# Patient Record
Sex: Female | Born: 1970 | Race: White | Hispanic: No | Marital: Single | Smoking: Light tobacco smoker
Health system: Southern US, Community
[De-identification: ages and names within clinical notes are randomized; demographics above are authoritative.]

## PROBLEM LIST (undated history)

## (undated) DIAGNOSIS — S0181XA Laceration without foreign body of other part of head, initial encounter: Secondary | ICD-10-CM

## (undated) DIAGNOSIS — Z8541 Personal history of malignant neoplasm of cervix uteri: Secondary | ICD-10-CM

## (undated) DIAGNOSIS — J302 Other seasonal allergic rhinitis: Secondary | ICD-10-CM

## (undated) DIAGNOSIS — C801 Malignant (primary) neoplasm, unspecified: Secondary | ICD-10-CM

## (undated) DIAGNOSIS — F419 Anxiety disorder, unspecified: Secondary | ICD-10-CM

## (undated) DIAGNOSIS — S52501A Unspecified fracture of the lower end of right radius, initial encounter for closed fracture: Secondary | ICD-10-CM

## (undated) DIAGNOSIS — I10 Essential (primary) hypertension: Secondary | ICD-10-CM

## (undated) DIAGNOSIS — I2510 Atherosclerotic heart disease of native coronary artery without angina pectoris: Secondary | ICD-10-CM

## (undated) DIAGNOSIS — R569 Unspecified convulsions: Secondary | ICD-10-CM

## (undated) DIAGNOSIS — E785 Hyperlipidemia, unspecified: Secondary | ICD-10-CM

## (undated) HISTORY — PX: ABDOMINAL HYSTERECTOMY: SHX81

## (undated) HISTORY — PX: CARDIAC CATHETERIZATION: SHX172

## (undated) HISTORY — PX: CHOLECYSTECTOMY: SHX55

---

## 1998-03-02 ENCOUNTER — Emergency Department (HOSPITAL_COMMUNITY): Admission: EM | Admit: 1998-03-02 | Discharge: 1998-03-02 | Payer: Self-pay | Admitting: Emergency Medicine

## 1998-03-03 ENCOUNTER — Ambulatory Visit (HOSPITAL_COMMUNITY): Admission: RE | Admit: 1998-03-03 | Discharge: 1998-03-03 | Payer: Self-pay | Admitting: Emergency Medicine

## 1998-07-07 ENCOUNTER — Other Ambulatory Visit: Admission: RE | Admit: 1998-07-07 | Discharge: 1998-07-07 | Payer: Self-pay | Admitting: *Deleted

## 1998-07-17 ENCOUNTER — Other Ambulatory Visit: Admission: RE | Admit: 1998-07-17 | Discharge: 1998-07-17 | Payer: Self-pay | Admitting: *Deleted

## 1998-08-11 ENCOUNTER — Inpatient Hospital Stay (HOSPITAL_COMMUNITY): Admission: RE | Admit: 1998-08-11 | Discharge: 1998-08-12 | Payer: Self-pay | Admitting: *Deleted

## 1998-12-19 ENCOUNTER — Emergency Department (HOSPITAL_COMMUNITY): Admission: EM | Admit: 1998-12-19 | Discharge: 1998-12-19 | Payer: Self-pay | Admitting: Emergency Medicine

## 1999-02-20 ENCOUNTER — Encounter: Payer: Self-pay | Admitting: Emergency Medicine

## 1999-02-20 ENCOUNTER — Emergency Department (HOSPITAL_COMMUNITY): Admission: EM | Admit: 1999-02-20 | Discharge: 1999-02-20 | Payer: Self-pay | Admitting: Emergency Medicine

## 1999-04-15 ENCOUNTER — Emergency Department (HOSPITAL_COMMUNITY): Admission: EM | Admit: 1999-04-15 | Discharge: 1999-04-16 | Payer: Self-pay

## 1999-04-16 ENCOUNTER — Encounter: Payer: Self-pay | Admitting: Emergency Medicine

## 2000-02-12 ENCOUNTER — Emergency Department (HOSPITAL_COMMUNITY): Admission: EM | Admit: 2000-02-12 | Discharge: 2000-02-12 | Payer: Self-pay | Admitting: Emergency Medicine

## 2001-04-05 ENCOUNTER — Emergency Department (HOSPITAL_COMMUNITY): Admission: EM | Admit: 2001-04-05 | Discharge: 2001-04-05 | Payer: Self-pay | Admitting: Emergency Medicine

## 2001-04-05 ENCOUNTER — Encounter: Payer: Self-pay | Admitting: Emergency Medicine

## 2003-06-13 ENCOUNTER — Encounter: Admission: RE | Admit: 2003-06-13 | Discharge: 2003-06-30 | Payer: Self-pay | Admitting: Specialist

## 2004-12-20 ENCOUNTER — Emergency Department (HOSPITAL_COMMUNITY): Admission: EM | Admit: 2004-12-20 | Discharge: 2004-12-20 | Payer: Self-pay | Admitting: Emergency Medicine

## 2004-12-20 ENCOUNTER — Ambulatory Visit: Payer: Self-pay | Admitting: Gastroenterology

## 2004-12-26 ENCOUNTER — Ambulatory Visit: Payer: Self-pay | Admitting: Gastroenterology

## 2004-12-26 ENCOUNTER — Ambulatory Visit (HOSPITAL_COMMUNITY): Admission: RE | Admit: 2004-12-26 | Discharge: 2004-12-26 | Payer: Self-pay | Admitting: Gastroenterology

## 2005-01-16 ENCOUNTER — Ambulatory Visit: Payer: Self-pay | Admitting: Gastroenterology

## 2005-01-17 ENCOUNTER — Ambulatory Visit (HOSPITAL_COMMUNITY): Admission: RE | Admit: 2005-01-17 | Discharge: 2005-01-17 | Payer: Self-pay | Admitting: Gastroenterology

## 2005-08-26 ENCOUNTER — Ambulatory Visit: Payer: Self-pay | Admitting: Pulmonary Disease

## 2005-08-26 ENCOUNTER — Ambulatory Visit: Payer: Self-pay | Admitting: Cardiology

## 2005-08-27 ENCOUNTER — Emergency Department (HOSPITAL_COMMUNITY): Admission: EM | Admit: 2005-08-27 | Discharge: 2005-08-27 | Payer: Self-pay | Admitting: Emergency Medicine

## 2005-08-30 ENCOUNTER — Ambulatory Visit: Payer: Self-pay | Admitting: Cardiovascular Disease

## 2005-09-02 ENCOUNTER — Ambulatory Visit: Payer: Self-pay | Admitting: Pulmonary Disease

## 2005-09-04 ENCOUNTER — Encounter: Payer: Self-pay | Admitting: Cardiology

## 2005-09-04 ENCOUNTER — Ambulatory Visit: Payer: Self-pay | Admitting: Cardiovascular Disease

## 2005-09-04 ENCOUNTER — Ambulatory Visit: Payer: Self-pay

## 2006-01-31 ENCOUNTER — Ambulatory Visit (HOSPITAL_COMMUNITY): Admission: RE | Admit: 2006-01-31 | Discharge: 2006-01-31 | Payer: Self-pay | Admitting: Obstetrics and Gynecology

## 2006-01-31 HISTORY — PX: LAPAROSCOPIC LYSIS OF ADHESIONS: SHX5905

## 2007-06-23 ENCOUNTER — Emergency Department (HOSPITAL_COMMUNITY): Admission: EM | Admit: 2007-06-23 | Discharge: 2007-06-23 | Payer: Self-pay | Admitting: Emergency Medicine

## 2008-01-15 ENCOUNTER — Emergency Department (HOSPITAL_COMMUNITY): Admission: EM | Admit: 2008-01-15 | Discharge: 2008-01-16 | Payer: Self-pay | Admitting: Emergency Medicine

## 2008-02-02 ENCOUNTER — Encounter (INDEPENDENT_AMBULATORY_CARE_PROVIDER_SITE_OTHER): Payer: Self-pay | Admitting: Gastroenterology

## 2008-02-02 ENCOUNTER — Ambulatory Visit (HOSPITAL_COMMUNITY): Admission: RE | Admit: 2008-02-02 | Discharge: 2008-02-02 | Payer: Self-pay | Admitting: Gastroenterology

## 2008-09-12 ENCOUNTER — Ambulatory Visit (HOSPITAL_COMMUNITY): Admission: RE | Admit: 2008-09-12 | Discharge: 2008-09-12 | Payer: Self-pay | Admitting: Neurology

## 2009-07-02 ENCOUNTER — Emergency Department (HOSPITAL_COMMUNITY): Admission: EM | Admit: 2009-07-02 | Discharge: 2009-07-02 | Payer: Self-pay | Admitting: Internal Medicine

## 2009-12-12 ENCOUNTER — Emergency Department (HOSPITAL_COMMUNITY): Admission: EM | Admit: 2009-12-12 | Discharge: 2009-12-12 | Payer: Self-pay | Admitting: Family Medicine

## 2009-12-22 ENCOUNTER — Emergency Department (HOSPITAL_COMMUNITY): Admission: EM | Admit: 2009-12-22 | Discharge: 2009-12-22 | Payer: Self-pay | Admitting: Emergency Medicine

## 2011-01-24 ENCOUNTER — Emergency Department (HOSPITAL_COMMUNITY)
Admission: EM | Admit: 2011-01-24 | Discharge: 2011-01-25 | Disposition: A | Discharge status: Home / Self Care | Payer: Self-pay | Attending: Emergency Medicine | Admitting: Emergency Medicine

## 2011-01-24 DIAGNOSIS — G40909 Epilepsy, unspecified, not intractable, without status epilepticus: Secondary | ICD-10-CM | POA: Insufficient documentation

## 2011-01-24 DIAGNOSIS — R0789 Other chest pain: Secondary | ICD-10-CM | POA: Insufficient documentation

## 2011-01-24 DIAGNOSIS — M542 Cervicalgia: Secondary | ICD-10-CM | POA: Insufficient documentation

## 2011-01-24 DIAGNOSIS — M5380 Other specified dorsopathies, site unspecified: Secondary | ICD-10-CM | POA: Insufficient documentation

## 2011-01-24 DIAGNOSIS — R209 Unspecified disturbances of skin sensation: Secondary | ICD-10-CM | POA: Insufficient documentation

## 2011-01-24 DIAGNOSIS — M79609 Pain in unspecified limb: Secondary | ICD-10-CM | POA: Insufficient documentation

## 2011-01-24 DIAGNOSIS — M546 Pain in thoracic spine: Secondary | ICD-10-CM | POA: Insufficient documentation

## 2011-01-25 ENCOUNTER — Emergency Department (HOSPITAL_COMMUNITY): Payer: Self-pay

## 2011-01-25 LAB — DIFFERENTIAL
Basophils Relative: 0 % (ref 0–1)
Lymphocytes Relative: 35 % (ref 12–46)
Lymphs Abs: 4.6 10*3/uL — ABNORMAL HIGH (ref 0.7–4.0)
Neutro Abs: 7.5 10*3/uL (ref 1.7–7.7)
Neutrophils Relative %: 57 % (ref 43–77)

## 2011-01-25 LAB — BASIC METABOLIC PANEL
BUN: 10 mg/dL (ref 6–23)
CO2: 24 mEq/L (ref 19–32)
Chloride: 102 mEq/L (ref 96–112)
Potassium: 4.1 mEq/L (ref 3.5–5.1)
Sodium: 134 mEq/L — ABNORMAL LOW (ref 135–145)

## 2011-01-25 LAB — CBC
MCV: 85.7 fL (ref 78.0–100.0)
Platelets: 357 10*3/uL (ref 150–400)
RBC: 4.61 MIL/uL (ref 3.87–5.11)

## 2011-01-25 LAB — POCT CARDIAC MARKERS
CKMB, poc: <1 ng/mL — ABNORMAL LOW (ref 1.0–8.0)
CKMB, poc: <1 ng/mL — ABNORMAL LOW (ref 1.0–8.0)
Myoglobin, poc: 44.8 ng/mL (ref 12–200)
Myoglobin, poc: 52.2 ng/mL (ref 12–200)
Troponin i, poc: <0.05 ng/mL (ref 0.00–0.09)
Troponin i, poc: <0.05 ng/mL (ref 0.00–0.09)

## 2011-02-12 NOTE — Op Note (Signed)
Mary Benson, SEABORN           ACCOUNT NO.:  [id number]   MEDICAL RECORD NO.:  [id number]          PATIENT TYPE:  AMB   LOCATION:  ENDO                         FACILITY:  [facility]   PHYSICIAN:  John C. Madilyn Fireman, M.D.    DATE OF BIRTH:  1971-07-09   DATE OF PROCEDURE:  DATE OF DISCHARGE:                               OPERATIVE REPORT   INDICATIONS FOR PROCEDURE:  Persistent rectal bleeding and diarrhea.   PROCEDURE:  The patient was placed in the left lateral decubitus  position and placed on the pulse monitor with continuous low-flow oxygen  delivered by nasal cannula.  She was sedated with 100 mcg IV fentanyl  and 10 mg IV Versed.  The Olympus video colonoscope was inserted into  the rectum and advanced to the cecum, confirmed by transillumination of  McBurney point, and visualization of the ileocecal valve and appendiceal  orifice.  Prep was excellent.  The cecum, ascending, transverse preceded  with the terminal ileum was intubated for several centimeters and  appeared within normal limits.  The cecum, ascending, transverse,  descending, and sigmoid colon all appeared normal with no masses,  polyps, diverticula, or other mucosal abnormalities.  The rectum  likewise appeared normal and retroflexed view of the anus revealed no  obvious internal hemorrhoids or other bleeding sources.  Biopsies were  taken to rule out microscopic colitis.   IMPRESSION:  Basically normal study including terminal ileum.   PLAN:  Await biopsy results, will follow up in the office and pursue  further workup of her diarrhea as indicated.           ______________________________  Everardo All. Madilyn Fireman, M.D.     JCH/MEDQ  D:  02/02/2008  T:  02/03/2008  Job:  [id number]

## 2011-02-15 NOTE — Op Note (Signed)
Mary Benson, Mary Benson           ACCOUNT NO.:  [id number]   MEDICAL RECORD NO.:  [id number]          PATIENT TYPE:  AMB   LOCATION:  SDC                           FACILITY:  [facility]   PHYSICIAN:  Guy Sandifer. Henderson Cloud, M.D. DATE OF BIRTH:  Jun 25, 1971   DATE OF PROCEDURE:  01/31/2006  DATE OF DISCHARGE:  01/31/2006                                 OPERATIVE REPORT   PREOPERATIVE DIAGNOSIS:  Pelvic pain, dyspareunia.   POSTOPERATIVE DIAGNOSIS:  Pelvic adhesions.   PROCEDURE:  Laparoscopy with lysis of adhesions.   SURGEON:  Harold Hedge, MD   ANESTHESIA:  General with endotracheal intubation.   ESTIMATED BLOOD LOSS:  Drops.   INDICATIONS AND CONSENT:  The patient is a 40 year old married white female  G1, P1, status post hysterectomy with increasing pelvic pain, dyspareunia.  Details dictated history and physical.  Laparoscopy has been discussed with  patient preoperatively.  Potential removal of right ovary if distinctly  abnormal has been discussed.  Potential risks and complications have been  discussed preoperatively including but not limited to infection, bowel,  bladder, ureteral damage, bleeding requiring transfusion of blood products  with possible transfusion reaction, HIV and hepatitis acquisition, DVT, PE,  pneumonia, recurrent pain or dyspareunia.  All questions were answered and  consent signed on the chart.   FINDINGS:  Upper abdomen is grossly normal.  Appendix was normal.  Left tube  and ovary were normal.  Left pelvic sidewall was normal.  Right fallopian  tube was normal.  The right ovary has a band of filmy adhesions to the right  pelvic sidewall.  There is a single point of adhesion of an epiploica from  the sigmoid to the right pelvic sidewall immediately beneath the right  ovary.  There is a second adhesion of epiploica from the sigmoid to the  middle of the vaginal cuff.  Posterior cul-de-sac is normal.   PROCEDURE:  The patient is taken to operating room where  she is identified,  placed in dorsosupine position and general anesthesia was induced via  endotracheal intubation.  She is then placed in dorsal lithotomy position  where she is prepped abdominally and vaginally.  Bladder is straight  catheterized.  Ring clamp with a single sponge was placed in vagina is a  manipulator and she is draped in sterile fashion.  The infraumbilical and  suprapubic areas were injected with 1/2% plain Marcaine.  A small  infraumbilical incision was made and a disposable Veress needle was placed  on the first attempt at difficulty.  The syringe and drop test are normal.  2 liters of gas were insufflated under low pressure with good tympany in the  right upper quadrant.  Veress needle was then removed in a 10-11 XL  disposable trocar sleeve was then placed using direct visualization with the  diagnostic laparoscopic.  Following that, the scope was replaced with the  operative laparoscopic.  A small suprapubic incision was made in a 5 mm  disposable bladeless XL trocar sleeve was placed under direct visualization  without difficulty.  The above findings were noted.  The adhesions are taken  down.  Hemostasis was maintained with bipolar cautery.  This was done clear  of the course of the right ureter.  There is a 1-2 cm translucent cyst on  the right ovary which drains with manipulation.  Irrigation is carried out.  Excess fluid is removed.  Interceed is back loaded through the laparoscope  and placed around the right ovary and slightly moistened.  Suprapubic trocar  sleeve was removed.  Pneumoperitoneum was reduced and hemostasis was noted  all around.  The pneumoperitoneum was completely reduced and the umbilical  trocar sleeve was removed.  2-0 Vicryl was placed in subcutaneous fashion to  close the umbilical incision.  Dermabond was placed on both incisions.  The  ring clamp was removed.  All counts correct.  The patient is awakened, taken  to recovery room in  stable condition.      Guy Sandifer Henderson Cloud, M.D.  Electronically Signed     JET/MEDQ  D:  01/31/2006  T:  02/02/2006  Job:  [id number]

## 2011-02-15 NOTE — H&P (Signed)
NAMEMELANA, Mary Benson           ACCOUNT NO.:  [id number]   MEDICAL RECORD NO.:  [id number]          PATIENT TYPE:  AMB   LOCATION:  SDC                           FACILITY:  [facility]   PHYSICIAN:  Guy Sandifer. Henderson Cloud, M.D. DATE OF BIRTH:  02/03/1971   DATE OF ADMISSION:  DATE OF DISCHARGE:                                HISTORY & PHYSICAL   CHIEF COMPLAINT:  Pelvic pain.   HISTORY OF PRESENT ILLNESS:  This patient is a 40 year old married white  female, G1, P1, status post hysterectomy who has had progressively  increasing pelvic pain and pain with intercourse over the last 12 months.  Over the last six months, she was unable to tolerate vaginal entry. She  recently had an acute onset of pain, and ultrasound at [facility] on December 18, 2005 revealed an enlarged right ovary with a question  of a heterogenous solid component and a cyst measuring 2.8 cm. Repeat  ultrasound on January 23, 2006 revealed a 9-mm follicle on the right ovary and  12- and 6-mm follicles on the left ovary with no solid masses noted. After  discussion of options and management, the patient is being admitted for  laparoscopy with possible right salpingo-oophorectomy. Potential risks and  complications have been reviewed preoperatively.   PAST MEDICAL HISTORY:  Negative.   PAST SURGICAL HISTORY:  1.  Hysterectomy.  2.  Gallbladder surgery.  3.  Right knee arthroscopy.   OBSTETRICAL HISTORY:  Cesarean section x1.   MEDICATIONS:  Xanax 5 mg p.r.n.   ALLERGIES:  VICODIN leading to swelling and rash, PENICILLIN leading to  swelling.   FAMILY HISTORY:  Diabetes in maternal aunt. Family history of lung disease.   SOCIAL HISTORY:  Smokes less than a half pack per day. Denies alcohol and  drug abuse.   REVIEW OF SYSTEMS:  NEUROLOGICAL:  Denies headache. CARDIAC:  Denies chest  pain. PULMONARY:  Denies shortness of breath. GASTROINTESTINAL:  Denies  recent changes in bowel habits.   PHYSICAL EXAMINATION:   VITAL SIGNS:  Height 5 feet 0 inches, weight 158  pounds.  HEENT:  Without thyromegaly.  LUNGS:  Clear to auscultation.  HEART:  Regular rate and rhythm.  BACK:  Without CVA tenderness.  BREASTS:  Without mass, retraction or discharge.  ABDOMEN:  Soft with right lower quadrant tenderness to deep palpation  without masses or rebound.  PELVIC:  Vulva and vagina without lesion. Diffuse tenderness through the  pelvis. Right adnexa more tender than the left. No masses palpable.  EXTREMITIES:  Grossly within normal limits.  NEUROLOGICAL:  Grossly within normal limits.   ASSESSMENT:  Pelvic pain and dyspareunia.   PLAN:  Laparoscopy with possible right salpingo-oophorectomy.      Guy Sandifer Henderson Cloud, M.D.  Electronically Signed     JET/MEDQ  D:  01/23/2006  T:  01/23/2006  Job:  [id number]

## 2011-06-25 LAB — URINALYSIS, ROUTINE W REFLEX MICROSCOPIC
Bilirubin Urine: NEGATIVE
Glucose, UA: NEGATIVE
Ketones, ur: NEGATIVE
Nitrite: NEGATIVE
pH: 5

## 2011-06-25 LAB — COMPREHENSIVE METABOLIC PANEL
ALT: 14
AST: 23
Alkaline Phosphatase: 63
CO2: 26
Calcium: 9.4
Chloride: 103
GFR calc Af Amer: >60
GFR calc non Af Amer: >60
Potassium: 3.5
Sodium: 139

## 2011-06-25 LAB — CBC
HCT: 42.4
MCHC: 34.5
MCV: 87.2
Platelets: 345
RBC: 4.86
RDW: 13.9

## 2011-06-25 LAB — LIPASE, BLOOD: Lipase: <10 — ABNORMAL LOW

## 2011-06-25 LAB — DIFFERENTIAL
Basophils Relative: 1
Eosinophils Absolute: 0.1
Eosinophils Relative: 1
Lymphs Abs: 3.1

## 2011-07-11 LAB — COMPREHENSIVE METABOLIC PANEL
Alkaline Phosphatase: 58
BUN: 8
Calcium: 8.8
Glucose, Bld: 96
Total Protein: 6.3

## 2011-07-11 LAB — POCT URINALYSIS DIP (DEVICE)
Bilirubin Urine: NEGATIVE
Hgb urine dipstick: NEGATIVE
Ketones, ur: NEGATIVE
pH: 5.5

## 2011-07-11 LAB — CBC
HCT: 39.4
Hemoglobin: 13.8
MCHC: 35
MCV: 87.2
RDW: 13.4

## 2011-07-11 LAB — DIFFERENTIAL
Basophils Relative: 1
Lymphs Abs: 3.1
Monocytes Relative: 6
Neutro Abs: 8.9 — ABNORMAL HIGH
Neutrophils Relative %: 69

## 2012-12-14 ENCOUNTER — Emergency Department (HOSPITAL_COMMUNITY)
Admission: EM | Admit: 2012-12-14 | Discharge: 2012-12-14 | Disposition: A | Discharge status: Home / Self Care | Payer: Self-pay | Attending: Emergency Medicine | Admitting: Emergency Medicine

## 2012-12-14 ENCOUNTER — Encounter (HOSPITAL_COMMUNITY): Payer: Self-pay | Admitting: *Deleted

## 2012-12-14 ENCOUNTER — Emergency Department (HOSPITAL_COMMUNITY): Payer: Self-pay

## 2012-12-14 DIAGNOSIS — Y9389 Activity, other specified: Secondary | ICD-10-CM | POA: Insufficient documentation

## 2012-12-14 DIAGNOSIS — Z90710 Acquired absence of both cervix and uterus: Secondary | ICD-10-CM | POA: Insufficient documentation

## 2012-12-14 DIAGNOSIS — S92515A Nondisplaced fracture of proximal phalanx of left lesser toe(s), initial encounter for closed fracture: Secondary | ICD-10-CM

## 2012-12-14 DIAGNOSIS — W2203XA Walked into furniture, initial encounter: Secondary | ICD-10-CM | POA: Insufficient documentation

## 2012-12-14 DIAGNOSIS — Y92009 Unspecified place in unspecified non-institutional (private) residence as the place of occurrence of the external cause: Secondary | ICD-10-CM | POA: Insufficient documentation

## 2012-12-14 DIAGNOSIS — S92919A Unspecified fracture of unspecified toe(s), initial encounter for closed fracture: Secondary | ICD-10-CM | POA: Insufficient documentation

## 2012-12-14 DIAGNOSIS — M25473 Effusion, unspecified ankle: Secondary | ICD-10-CM | POA: Insufficient documentation

## 2012-12-14 DIAGNOSIS — F17200 Nicotine dependence, unspecified, uncomplicated: Secondary | ICD-10-CM | POA: Insufficient documentation

## 2012-12-14 DIAGNOSIS — Z8543 Personal history of malignant neoplasm of ovary: Secondary | ICD-10-CM | POA: Insufficient documentation

## 2012-12-14 DIAGNOSIS — M25476 Effusion, unspecified foot: Secondary | ICD-10-CM | POA: Insufficient documentation

## 2012-12-14 MED ORDER — HYDROCODONE-ACETAMINOPHEN 5-325 MG PO TABS: 1.0000 | ORAL_TABLET | Freq: Four times a day (QID) | ORAL | Status: DC | PRN

## 2012-12-14 MED ORDER — HYDROCODONE-ACETAMINOPHEN 5-325 MG PO TABS
1.0000 | ORAL_TABLET | Freq: Once | ORAL | Status: AC
  Administered 2012-12-14: 1 via ORAL
  Filled 2012-12-14: qty 1

## 2012-12-14 NOTE — ED Notes (Signed)
Tripped and fell this am striking left foot on dresser has bruise to top of foot . Foot is swollen and tender to touch  Hurts to bear wt on foot she states

## 2012-12-14 NOTE — ED Notes (Signed)
Pt states she hit left foot into a dresser today.

## 2012-12-14 NOTE — ED Provider Notes (Signed)
Medical screening examination/treatment/procedure(s) were performed by non-physician practitioner and as supervising physician I was immediately available for consultation/collaboration.   Zyann Mabry, MD 12/14/12 1444 

## 2012-12-14 NOTE — ED Provider Notes (Signed)
History     CSN: [id number]  Arrival date & time 12/14/12  6440   First MD Initiated Contact with Patient 12/14/12 [phone]      Chief Complaint  Patient presents with  . Foot Injury    (Consider location/radiation/quality/duration/timing/severity/associated sxs/prior treatment) HPI The patient presents to the ER after kicking her dresser by accident. The patient states that this happened just prior to arrival. The patient states that the pain is worse with movement. The patient denies numbness or weakness of the foot. The patient did not take anything prior to arrival.  Past Medical History  Diagnosis Date  . Ovarian cancer     Past Surgical History  Procedure Laterality Date  . Abdominal hysterectomy      No family history on file.  History  Substance Use Topics  . Smoking status: Current Every Day Smoker  . Smokeless tobacco: Not on file  . Alcohol Use: Yes     Comment: occ    OB History   Grav Para Term Preterm Abortions TAB SAB Ect Mult Living                  Review of Systems All other systems negative except as documented in the HPI. All pertinent positives and negatives as reviewed in the HPI.  Allergies  Penicillins  Home Medications   Current Outpatient Rx  Name  Route  Sig  Dispense  Refill  . aspirin-acetaminophen-caffeine (EXCEDRIN MIGRAINE) 250-250-65 MG per tablet   Oral   Take 2 tablets by mouth every 6 (six) hours as needed for pain (headache).         . Aspirin-Acetaminophen-Caffeine (GOODY HEADACHE PO)   Oral   Take 1 Package by mouth daily as needed (headache).           BP 136/95  Pulse 103  Temp(Src) 97.6 F (36.4 C) (Oral)  Resp 18  SpO2 100%  Physical Exam  Constitutional: She is oriented to person, place, and time. She appears well-developed and well-nourished. No distress.  HENT:  Head: Normocephalic and atraumatic.  Pulmonary/Chest: Effort normal.  Musculoskeletal:       Left foot: She exhibits tenderness and  swelling. She exhibits normal capillary refill, no crepitus and no deformity.       Feet:  Neurological: She is alert and oriented to person, place, and time.    ED Course  Procedures (including critical care time)  Labs Reviewed - No data to display Dg Foot Complete Left  12/14/2012  *RADIOLOGY REPORT*  Clinical Data: Injury, pain.  LEFT FOOT - COMPLETE 3+ VIEW  Comparison: None.  Findings: The patient has a nondisplaced fracture through the proximal metaphysis and diaphysis of the proximal phalanx of the fourth toe.  No other acute bony or joint abnormality is identified.  IMPRESSION: Nondisplaced fracture proximal phalanx fourth toe.   Original Report Authenticated By: Holley Dexter, M.D.     The patient buddy taped and hard sole shoe applied. Return here as needed. Follow up with ortho given.  MDM          Carlyle Dolly, PA-C 12/14/12 1055

## 2012-12-14 NOTE — Progress Notes (Signed)
Orthopedic Tech Progress Note Patient Details:  Mary Benson Jun 03, 1971 [id number]  Ortho Devices Type of Ortho Device: Crutches;Buddy tape;Postop shoe/boot Ortho Device/Splint Interventions: Application   Cammer, Mickie Bail 12/14/2012, 10:52 AM

## 2012-12-14 NOTE — ED Notes (Signed)
Called for post op shoe and buddy tape

## 2013-02-22 ENCOUNTER — Emergency Department (HOSPITAL_COMMUNITY): Payer: Self-pay

## 2013-02-22 ENCOUNTER — Emergency Department (HOSPITAL_COMMUNITY)
Admission: EM | Admit: 2013-02-22 | Discharge: 2013-02-23 | Disposition: A | Discharge status: Home / Self Care | Payer: Self-pay | Attending: Emergency Medicine | Admitting: Emergency Medicine

## 2013-02-22 ENCOUNTER — Encounter (HOSPITAL_COMMUNITY): Payer: Self-pay

## 2013-02-22 DIAGNOSIS — S0181XA Laceration without foreign body of other part of head, initial encounter: Secondary | ICD-10-CM

## 2013-02-22 DIAGNOSIS — R51 Headache: Secondary | ICD-10-CM | POA: Insufficient documentation

## 2013-02-22 DIAGNOSIS — J309 Allergic rhinitis, unspecified: Secondary | ICD-10-CM | POA: Insufficient documentation

## 2013-02-22 DIAGNOSIS — R569 Unspecified convulsions: Secondary | ICD-10-CM | POA: Insufficient documentation

## 2013-02-22 DIAGNOSIS — S52501A Unspecified fracture of the lower end of right radius, initial encounter for closed fracture: Secondary | ICD-10-CM

## 2013-02-22 DIAGNOSIS — S0191XA Laceration without foreign body of unspecified part of head, initial encounter: Secondary | ICD-10-CM

## 2013-02-22 DIAGNOSIS — Z79899 Other long term (current) drug therapy: Secondary | ICD-10-CM | POA: Insufficient documentation

## 2013-02-22 DIAGNOSIS — F1010 Alcohol abuse, uncomplicated: Secondary | ICD-10-CM | POA: Insufficient documentation

## 2013-02-22 DIAGNOSIS — F10929 Alcohol use, unspecified with intoxication, unspecified: Secondary | ICD-10-CM

## 2013-02-22 DIAGNOSIS — F17200 Nicotine dependence, unspecified, uncomplicated: Secondary | ICD-10-CM | POA: Insufficient documentation

## 2013-02-22 DIAGNOSIS — Z880 Allergy status to penicillin: Secondary | ICD-10-CM | POA: Insufficient documentation

## 2013-02-22 DIAGNOSIS — S52599A Other fractures of lower end of unspecified radius, initial encounter for closed fracture: Secondary | ICD-10-CM | POA: Insufficient documentation

## 2013-02-22 DIAGNOSIS — S0180XA Unspecified open wound of other part of head, initial encounter: Secondary | ICD-10-CM | POA: Insufficient documentation

## 2013-02-22 DIAGNOSIS — Z8541 Personal history of malignant neoplasm of cervix uteri: Secondary | ICD-10-CM | POA: Insufficient documentation

## 2013-02-22 HISTORY — DX: Laceration without foreign body of other part of head, initial encounter: S01.81XA

## 2013-02-22 HISTORY — DX: Unspecified fracture of the lower end of right radius, initial encounter for closed fracture: S52.501A

## 2013-02-22 LAB — CBC WITH DIFFERENTIAL/PLATELET
Eosinophils Relative: 1 % (ref 0–5)
Lymphocytes Relative: 37 % (ref 12–46)
Lymphs Abs: 4.3 10*3/uL — ABNORMAL HIGH (ref 0.7–4.0)
MCV: 85.8 fL (ref 78.0–100.0)
Neutro Abs: 6.5 10*3/uL (ref 1.7–7.7)
Platelets: 278 10*3/uL (ref 150–400)
RBC: 4.71 MIL/uL (ref 3.87–5.11)
WBC: 11.5 10*3/uL — ABNORMAL HIGH (ref 4.0–10.5)

## 2013-02-22 LAB — POCT I-STAT, CHEM 8
BUN: 10 mg/dL (ref 6–23)
Calcium, Ion: 1.09 mmol/L — ABNORMAL LOW (ref 1.12–1.23)
Chloride: 113 mEq/L — ABNORMAL HIGH (ref 96–112)
Potassium: 3.5 mEq/L (ref 3.5–5.1)
Sodium: 145 mEq/L (ref 135–145)

## 2013-02-22 LAB — ETHANOL: Alcohol, Ethyl (B): 230 mg/dL — ABNORMAL HIGH (ref 0–11)

## 2013-02-22 MED ORDER — LORAZEPAM 2 MG/ML IJ SOLN
INTRAMUSCULAR | Status: AC
  Filled 2013-02-22: qty 1

## 2013-02-22 MED ORDER — LORAZEPAM 2 MG/ML IJ SOLN
2.0000 mg | Freq: Once | INTRAMUSCULAR | Status: AC
  Administered 2013-02-22: 2 mg via INTRAVENOUS
  Filled 2013-02-22: qty 1
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
## 2013-02-23 ENCOUNTER — Other Ambulatory Visit: Payer: Self-pay | Admitting: Orthopedic Surgery

## 2013-02-23 ENCOUNTER — Encounter (HOSPITAL_BASED_OUTPATIENT_CLINIC_OR_DEPARTMENT_OTHER): Payer: Self-pay | Admitting: *Deleted

## 2013-02-23 MED ORDER — LORAZEPAM 2 MG/ML IJ SOLN
2.0000 mg | Freq: Once | INTRAMUSCULAR | Status: AC
  Administered 2013-02-23: 2 mg via INTRAVENOUS

## 2013-02-23 MED ORDER — LORAZEPAM 2 MG/ML IJ SOLN
INTRAMUSCULAR | Status: AC
  Filled 2013-02-23: qty 1

## 2013-02-23 MED ORDER — TRAMADOL HCL 50 MG PO TABS: 50.0000 mg | ORAL_TABLET | Freq: Four times a day (QID) | ORAL | Status: DC | PRN

## 2013-02-23 MED ORDER — LORAZEPAM 2 MG/ML IJ SOLN
INTRAMUSCULAR | Status: AC
  Administered 2013-02-23: 2 mg
  Filled 2013-02-23: qty 1

## 2013-02-23 NOTE — ED Notes (Signed)
Pt walked out of ED without signing discharge papers with her husband.

## 2013-02-23 NOTE — ED Notes (Signed)
Pt  Says she is very sleepy and wants to go home later but disappeared without  Signing discharged.

## 2013-02-23 NOTE — ED Provider Notes (Signed)
Medical screening examination/treatment/procedure(s) were conducted as a shared visit with non-physician practitioner(s) and myself.  I personally evaluated the patient during the encounter  Please see my separate respective documentation pertaining to this patient encounter   Takeila Thayne D Fanta Wimberley, MD 02/23/13 0719 

## 2013-02-23 NOTE — ED Notes (Signed)
Pt very deep sleep.

## 2013-02-24 ENCOUNTER — Ambulatory Visit (HOSPITAL_BASED_OUTPATIENT_CLINIC_OR_DEPARTMENT_OTHER): Payer: Self-pay | Admitting: Anesthesiology

## 2013-02-24 ENCOUNTER — Ambulatory Visit (HOSPITAL_COMMUNITY): Payer: Self-pay

## 2013-02-24 ENCOUNTER — Encounter (HOSPITAL_BASED_OUTPATIENT_CLINIC_OR_DEPARTMENT_OTHER)
Admission: RE | Disposition: A | Discharge status: Home / Self Care | Payer: Self-pay | Source: Ambulatory Visit | Attending: Orthopedic Surgery

## 2013-02-24 ENCOUNTER — Encounter (HOSPITAL_BASED_OUTPATIENT_CLINIC_OR_DEPARTMENT_OTHER): Payer: Self-pay | Admitting: *Deleted

## 2013-02-24 ENCOUNTER — Encounter (HOSPITAL_BASED_OUTPATIENT_CLINIC_OR_DEPARTMENT_OTHER): Payer: Self-pay | Admitting: Anesthesiology

## 2013-02-24 ENCOUNTER — Ambulatory Visit (HOSPITAL_BASED_OUTPATIENT_CLINIC_OR_DEPARTMENT_OTHER)
Admission: RE | Admit: 2013-02-24 | Discharge: 2013-02-24 | Disposition: A | Discharge status: Home / Self Care | Payer: Self-pay | Source: Ambulatory Visit | Attending: Orthopedic Surgery | Admitting: Orthopedic Surgery

## 2013-02-24 DIAGNOSIS — X58XXXA Exposure to other specified factors, initial encounter: Secondary | ICD-10-CM | POA: Insufficient documentation

## 2013-02-24 DIAGNOSIS — Z885 Allergy status to narcotic agent status: Secondary | ICD-10-CM | POA: Insufficient documentation

## 2013-02-24 DIAGNOSIS — Z8541 Personal history of malignant neoplasm of cervix uteri: Secondary | ICD-10-CM | POA: Insufficient documentation

## 2013-02-24 DIAGNOSIS — S52599A Other fractures of lower end of unspecified radius, initial encounter for closed fracture: Secondary | ICD-10-CM | POA: Insufficient documentation

## 2013-02-24 DIAGNOSIS — F17200 Nicotine dependence, unspecified, uncomplicated: Secondary | ICD-10-CM | POA: Insufficient documentation

## 2013-02-24 DIAGNOSIS — Z880 Allergy status to penicillin: Secondary | ICD-10-CM | POA: Insufficient documentation

## 2013-02-24 DIAGNOSIS — G40909 Epilepsy, unspecified, not intractable, without status epilepticus: Secondary | ICD-10-CM | POA: Insufficient documentation

## 2013-02-24 HISTORY — DX: Other seasonal allergic rhinitis: J30.2

## 2013-02-24 HISTORY — DX: Unspecified convulsions: R56.9

## 2013-02-24 HISTORY — DX: Unspecified fracture of the lower end of right radius, initial encounter for closed fracture: S52.501A

## 2013-02-24 HISTORY — DX: Laceration without foreign body of other part of head, initial encounter: S01.81XA

## 2013-02-24 HISTORY — DX: Personal history of malignant neoplasm of cervix uteri: Z85.41

## 2013-02-24 HISTORY — PX: OPEN REDUCTION INTERNAL FIXATION (ORIF) DISTAL RADIAL FRACTURE: SHX5989

## 2013-02-24 SURGERY — OPEN REDUCTION INTERNAL FIXATION (ORIF) DISTAL RADIUS FRACTURE
Anesthesia: General | Site: Wrist | Laterality: Right | Wound class: Clean

## 2013-02-24 MED ORDER — OXYCODONE HCL 5 MG PO TABS: 5.0000 mg | ORAL_TABLET | Freq: Once | ORAL | Status: DC | PRN

## 2013-02-24 MED ORDER — LACTATED RINGERS IV SOLN
INTRAVENOUS | Status: DC
  Administered 2013-02-24 (×2): via INTRAVENOUS

## 2013-02-24 MED ORDER — HYDROMORPHONE HCL PF 1 MG/ML IJ SOLN: 0.2500 mg | INTRAMUSCULAR | Status: DC | PRN

## 2013-02-24 MED ORDER — LIDOCAINE HCL (CARDIAC) 20 MG/ML IV SOLN
INTRAVENOUS | Status: DC | PRN
  Administered 2013-02-24: 80 mg via INTRAVENOUS

## 2013-02-24 MED ORDER — CLINDAMYCIN PHOSPHATE 600 MG/50ML IV SOLN
INTRAVENOUS | Status: DC | PRN
  Administered 2013-02-24: 600 mg via INTRAVENOUS

## 2013-02-24 MED ORDER — ONDANSETRON HCL 4 MG/2ML IJ SOLN
INTRAMUSCULAR | Status: DC | PRN
  Administered 2013-02-24: 10 mg via INTRAVENOUS
  Administered 2013-02-24: 4 mg via INTRAVENOUS

## 2013-02-24 MED ORDER — PROPOFOL 10 MG/ML IV BOLUS
INTRAVENOUS | Status: DC | PRN
  Administered 2013-02-24: 180 mg via INTRAVENOUS

## 2013-02-24 MED ORDER — BUPIVACAINE-EPINEPHRINE PF 0.5-1:200000 % IJ SOLN
INTRAMUSCULAR | Status: DC | PRN
  Administered 2013-02-24: 25 mL

## 2013-02-24 MED ORDER — MIDAZOLAM HCL 2 MG/ML PO SYRP: 12.0000 mg | ORAL_SOLUTION | Freq: Once | ORAL | Status: DC | PRN

## 2013-02-24 MED ORDER — FENTANYL CITRATE 0.05 MG/ML IJ SOLN
50.0000 ug | INTRAMUSCULAR | Status: DC | PRN
  Administered 2013-02-24: 100 ug via INTRAVENOUS

## 2013-02-24 MED ORDER — OXYCODONE-ACETAMINOPHEN 5-325 MG PO TABS: 1.0000 | ORAL_TABLET | ORAL | Status: DC | PRN

## 2013-02-24 MED ORDER — DEXAMETHASONE SODIUM PHOSPHATE 4 MG/ML IJ SOLN
INTRAMUSCULAR | Status: DC | PRN
  Administered 2013-02-24: 4 mg

## 2013-02-24 MED ORDER — MIDAZOLAM HCL 2 MG/2ML IJ SOLN
1.0000 mg | INTRAMUSCULAR | Status: DC | PRN
  Administered 2013-02-24: 2 mg via INTRAVENOUS

## 2013-02-24 MED ORDER — OXYCODONE HCL 5 MG/5ML PO SOLN: 5.0000 mg | Freq: Once | ORAL | Status: DC | PRN

## 2013-02-24 SURGICAL SUPPLY — 61 items
AIMING GUIDES, 1.5MM ×6 IMPLANT
BANDAGE COBAN STERILE 2 (GAUZE/BANDAGES/DRESSINGS) IMPLANT
BANDAGE GAUZE ELAST BULKY 4 IN (GAUZE/BANDAGES/DRESSINGS) ×4 IMPLANT
BLADE MINI RND TIP GREEN BEAV (BLADE) IMPLANT
BLADE SURG 15 STRL LF DISP TIS (BLADE) ×2 IMPLANT
BLADE SURG 15 STRL SS (BLADE) ×2
BNDG COHESIVE 4X5 TAN STRL (GAUZE/BANDAGES/DRESSINGS) ×2 IMPLANT
BNDG ESMARK 4X9 LF (GAUZE/BANDAGES/DRESSINGS) ×2 IMPLANT
BRUSH SCRUB EZ PLAIN DRY (MISCELLANEOUS) ×2 IMPLANT
CANISTER SUCTION 1200CC (MISCELLANEOUS) IMPLANT
CHLORAPREP W/TINT 26ML (MISCELLANEOUS) ×2 IMPLANT
CORDS BIPOLAR (ELECTRODE) ×2 IMPLANT
COVER MAYO STAND STRL (DRAPES) ×2 IMPLANT
COVER TABLE BACK 60X90 (DRAPES) ×2 IMPLANT
CUFF TOURNIQUET SINGLE 18IN (TOURNIQUET CUFF) ×2 IMPLANT
CUFF TOURNIQUET SINGLE 24IN (TOURNIQUET CUFF) IMPLANT
DRAPE C-ARM 42X72 X-RAY (DRAPES) ×2 IMPLANT
DRAPE EXTREMITY T 121X128X90 (DRAPE) ×2 IMPLANT
DRAPE SURG 17X23 STRL (DRAPES) ×2 IMPLANT
DRILL, SOLID SIDE CUTTING, 2.0MMX40MM ×2 IMPLANT
DRILL, SOLID SIDE CUTTING, 2.5MMX40MM ×2 IMPLANT
DRSG ADAPTIC 3X8 NADH LF (GAUZE/BANDAGES/DRESSINGS) ×2 IMPLANT
DRSG EMULSION OIL 3X3 NADH (GAUZE/BANDAGES/DRESSINGS) IMPLANT
ELECT REM PT RETURN 9FT ADLT (ELECTROSURGICAL) ×2
ELECTRODE REM PT RTRN 9FT ADLT (ELECTROSURGICAL) ×1 IMPLANT
GLOVE BIO SURGEON STRL SZ7.5 (GLOVE) ×2 IMPLANT
GLOVE BIOGEL PI IND STRL 8 (GLOVE) ×1 IMPLANT
GLOVE BIOGEL PI INDICATOR 8 (GLOVE) ×1
GLOVE ECLIPSE 6.5 STRL STRAW (GLOVE) ×2 IMPLANT
GLOVE EXAM NITRILE EXT CUFF MD (GLOVE) ×2 IMPLANT
GLOVE INDICATOR 7.0 STRL GRN (GLOVE) ×2 IMPLANT
GOWN PREVENTION PLUS XLARGE (GOWN DISPOSABLE) IMPLANT
K-WIRE, STANDARD TIP, 1.5MMX127MM ×8 IMPLANT
NEEDLE HYPO 25X1 1.5 SAFETY (NEEDLE) IMPLANT
NS IRRIG 1000ML POUR BTL (IV SOLUTION) ×2 IMPLANT
PACK BASIN DAY SURGERY FS (CUSTOM PROCEDURE TRAY) ×2 IMPLANT
PAD CAST 4YDX4 CTTN HI CHSV (CAST SUPPLIES) ×1 IMPLANT
PADDING CAST ABS 4INX4YD NS (CAST SUPPLIES)
PADDING CAST ABS COTTON 4X4 ST (CAST SUPPLIES) IMPLANT
PADDING CAST COTTON 4X4 STRL (CAST SUPPLIES) ×1
PEG SMOOTH LOCK 2.0X16 (Screw) ×4 IMPLANT
PEG SMOOTH LOCK 2.0X18 (Screw) ×8 IMPLANT
PENCIL BUTTON HOLSTER BLD 10FT (ELECTRODE) ×2 IMPLANT
PLATE DVR NARROW (Plate) ×2 IMPLANT
RUBBERBAND STERILE (MISCELLANEOUS) IMPLANT
SLEEVE SCD COMPRESS KNEE MED (MISCELLANEOUS) ×2 IMPLANT
SPLINT FIBERGLASS 3X35 (CAST SUPPLIES) ×2 IMPLANT
SPONGE GAUZE 4X4 12PLY (GAUZE/BANDAGES/DRESSINGS) ×2 IMPLANT
STOCKINETTE 4X48 STRL (DRAPES) ×2 IMPLANT
SUT VIC AB 2-0 CT3 27 (SUTURE) ×2 IMPLANT
SUT VICRYL 4-0 PS2 18IN ABS (SUTURE) IMPLANT
SUT VICRYL RAPIDE 4/0 PS 2 (SUTURE) ×2 IMPLANT
SYR BULB 3OZ (MISCELLANEOUS) ×2 IMPLANT
SYRINGE 10CC LL (SYRINGE) IMPLANT
TOWEL OR 17X24 6PK STRL BLUE (TOWEL DISPOSABLE) ×2 IMPLANT
TOWEL OR NON WOVEN STRL DISP B (DISPOSABLE) ×2 IMPLANT
UNDERPAD 30X30 INCONTINENT (UNDERPADS AND DIAPERS) ×2 IMPLANT
high compression locking peg, 2.7mmx20mm ×2 IMPLANT
screw cortical locking, 3.5mmx12mm ×2 IMPLANT
screw cortical nonlocking, 3.5mmx12mm ×2 IMPLANT
screw, cortical locking, 3.5mmx10mm ×2 IMPLANT

## 2013-02-24 NOTE — Anesthesia Postprocedure Evaluation (Signed)
  Anesthesia Post-op Note  Patient: Mary Benson  Procedure(s) Performed: Procedure(s): OPEN REDUCTION INTERNAL FIXATION (ORIF) RIGHT DISTAL RADIUS FRACTURE (Right)  Patient Location: PACU  Anesthesia Type:GA combined with regional for post-op pain  Level of Consciousness: awake  Airway and Oxygen Therapy: Patient Spontanous Breathing  Post-op Pain: none  Post-op Assessment: Post-op Vital signs reviewed, Patient's Cardiovascular Status Stable, Respiratory Function Stable, Patent Airway, No signs of Nausea or vomiting and Pain level controlled  Post-op Vital Signs: stable  Complications: No apparent anesthesia complications

## 2013-02-24 NOTE — Anesthesia Procedure Notes (Addendum)
Anesthesia Regional Block:  Supraclavicular block  Pre-Anesthetic Checklist: ,, timeout performed, Correct Patient, Correct Site, Correct Laterality, Correct Procedure, Correct Position, site marked, Risks and benefits discussed,  Surgical consent,  Pre-op evaluation,  At surgeon's request and post-op pain management  Laterality: Right     Needles:  Injection technique: Single-shot  Needle Type: Echogenic Stimulator Needle     Needle Length: 5cm 5 cm Needle Gauge: 22 and 22 G    Additional Needles:  Procedures: ultrasound guided (picture in chart) and nerve stimulator Supraclavicular block  Nerve Stimulator or Paresthesia:  Response: 0.48 mA,   Additional Responses:   Narrative:  Start time: 02/24/2013 10:20 AM End time: 02/24/2013 10:26 AM Injection made incrementally with aspirations every 3 mL. Anesthesiologist: Dr Gypsy Balsam  Additional Notes: [id number] R Supraclav Nerve Block POP CHG prep, sterile tech #22 stim/echo needle with stim down to .48ma and good [place] visualization-PIX in chart Multiple neg asp Marc .5% w/epi 1:200000 25cc+decadron 4mg  infil No compl Dr Gypsy Balsam   Procedure Name: LMA Insertion Date/Time: 02/24/2013 11:19 AM Performed by: Gar Gibbon Pre-anesthesia Checklist: Patient identified, Emergency Drugs available, Suction available and Patient being monitored Patient Re-evaluated:Patient Re-evaluated prior to inductionOxygen Delivery Method: Circle System Utilized Preoxygenation: Pre-oxygenation with 100% oxygen Intubation Type: IV induction Ventilation: Mask ventilation without difficulty LMA: LMA inserted LMA Size: 4.0 Number of attempts: 1 Airway Equipment and Method: bite block Placement Confirmation: positive ETCO2 Tube secured with: Tape Dental Injury: Teeth and Oropharynx as per pre-operative assessment

## 2013-02-24 NOTE — Transfer of Care (Signed)
Immediate Anesthesia Transfer of Care Note  Patient: Mary Benson  Procedure(s) Performed: Procedure(s): OPEN REDUCTION INTERNAL FIXATION (ORIF) RIGHT DISTAL RADIUS FRACTURE (Right)  Patient Location: PACU  Anesthesia Type:GA combined with regional for post-op pain  Level of Consciousness: sedated and patient cooperative  Airway & Oxygen Therapy: Patient Spontanous Breathing and Patient connected to face mask oxygen  Post-op Assessment: Report given to PACU RN and Post -op Vital signs reviewed and stable  Post vital signs: Reviewed and stable  Complications: No apparent anesthesia complications

## 2013-02-24 NOTE — Anesthesia Preprocedure Evaluation (Signed)
Anesthesia Evaluation  Patient identified by MRN, date of birth, ID band Patient awake    Reviewed: Allergy & Precautions, H&P , NPO status , Patient's Chart, lab work & pertinent test results  Airway Mallampati: I TM Distance: >3 FB Neck ROM: Full    Dental   Pulmonary COPDCurrent Smoker,  + rhonchi         Cardiovascular Rhythm:Regular Rate:Normal     Neuro/Psych  Headaches, Seizures -,     GI/Hepatic   Endo/Other    Renal/GU      Musculoskeletal   Abdominal   Peds  Hematology   Anesthesia Other Findings   Reproductive/Obstetrics                           Anesthesia Physical Anesthesia Plan  ASA: III  Anesthesia Plan: General   Post-op Pain Management:    Induction: Intravenous  Airway Management Planned: LMA  Additional Equipment:   Intra-op Plan:   Post-operative Plan: Extubation in OR  Informed Consent: I have reviewed the patients History and Physical, chart, labs and discussed the procedure including the risks, benefits and alternatives for the proposed anesthesia with the patient or authorized representative who has indicated his/her understanding and acceptance.     Plan Discussed with: CRNA and Surgeon  Anesthesia Plan Comments:         Anesthesia Quick Evaluation

## 2013-02-24 NOTE — Progress Notes (Signed)
Assisted Dr. Kasik with right, ultrasound guided, supraclavicular block. Side rails up, monitors on throughout procedure. See vital signs in flow sheet. Tolerated Procedure well. 

## 2013-02-24 NOTE — Op Note (Signed)
02/24/2013  11:08 AM  PATIENT:  Mary Benson  42 y.o. female  PRE-OPERATIVE DIAGNOSIS:  Displaced right distal radius fracture  POST-OPERATIVE DIAGNOSIS:  Same  PROCEDURE:  ORIF right distal radius fracture  SURGEON: Cliffton Asters. Janee Morn, MD  PHYSICIAN ASSISTANT: None  ANESTHESIA:  general  SPECIMENS:  None  DRAINS:   None  PREOPERATIVE INDICATIONS:  Mary Benson is a  42 y.o. female with a displaced right distal radius fracture, for whom operative intervention is indicated  The risks benefits and alternatives were discussed with the patient preoperatively including but not limited to the risks of infection, bleeding, nerve injury, cardiopulmonary complications, the need for revision surgery, among others, and the patient verbalized understanding and consented to proceed.  OPERATIVE IMPLANTS: Skeletal dynamics narrow standard plate  OPERATIVE FINDINGS: Very distal fracture, appear to be extra-articular,, anatomic alignment achieved.  OPERATIVE PROCEDURE:  After receiving prophylactic antibiotics and a regional block, the patient was escorted to the operative theatre and placed in a supine position. General anesthesia was administered.  A surgical "time-out" was performed during which the planned procedure, proposed operative site, and the correct patient identity were compared to the operative consent and agreement confirmed by the circulating nurse according to current facility policy.  Following application of a tourniquet to the operative extremity, the exposed skin was pre-scrub with a Hibiclens scrub brush before being formally prepped with Chloraprep and draped in the usual sterile fashion.  The limb was exsanguinated with an Esmarch bandage and the tourniquet inflated to approximately higher than systolic BP.  A standard extended FCR approach was performed to a sinusoidal incision. The pronator was found to be transversely split very distal, and the remainder  was reflected ulnarly. This exposed the volar portion of the bone. The brachial radialis was not divided. The fracture to be manipulatively reduced. The plate was applied against the volar aspect of the bone and its position adjusted. It was secured with a single screw in the slotted shaft hole followed by K wire and one of the shaft holes to prevent plate migration. The distal fracture was reduced against the plate in the distal holes drilled with a combination of smooth pegs and a compressing screw. The 2 additional shaft holes were filled with locking screws. Final images were obtained revealing near-anatomic reduction and acceptable position of the hardware. It was extra-articular and did not appear to penetrate past the dorsal surface of the bone distally. The wound is copiously irrigated and the pronator repaired with 2-0 Vicryl suture. Tourniquet was released and additional hemostasis obtained and the skin was closed with 2-0 Vicryl deep dermal buried sutures followed by running 4-0 Vicryl Rapide horizontal mattress suture.  The DRUJ was assessed and found to be stable. Short arm immobilizatio  was provided with a bulky splint dressing and she was awakened and taken to the PACU in stable condition  DISPOSITION: She will be discharged home today with typical postop instructions, returning in 10-15 days for reevaluation with new x-rays of the right wrist out of the splint to include an incline lateral, and conversion to a removable wrist splint.
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
## 2013-02-25 ENCOUNTER — Encounter (HOSPITAL_BASED_OUTPATIENT_CLINIC_OR_DEPARTMENT_OTHER): Payer: Self-pay | Admitting: Orthopedic Surgery

## 2013-03-01 ENCOUNTER — Emergency Department (HOSPITAL_COMMUNITY): Payer: Self-pay

## 2013-03-01 ENCOUNTER — Encounter (HOSPITAL_COMMUNITY): Payer: Self-pay | Admitting: Emergency Medicine

## 2013-03-01 ENCOUNTER — Emergency Department (HOSPITAL_COMMUNITY)
Admission: EM | Admit: 2013-03-01 | Discharge: 2013-03-01 | Disposition: A | Discharge status: Home / Self Care | Payer: Self-pay | Attending: Emergency Medicine | Admitting: Emergency Medicine

## 2013-03-01 DIAGNOSIS — Z8541 Personal history of malignant neoplasm of cervix uteri: Secondary | ICD-10-CM | POA: Insufficient documentation

## 2013-03-01 DIAGNOSIS — Y9389 Activity, other specified: Secondary | ICD-10-CM | POA: Insufficient documentation

## 2013-03-01 DIAGNOSIS — S8991XA Unspecified injury of right lower leg, initial encounter: Secondary | ICD-10-CM

## 2013-03-01 DIAGNOSIS — Z8709 Personal history of other diseases of the respiratory system: Secondary | ICD-10-CM | POA: Insufficient documentation

## 2013-03-01 DIAGNOSIS — G40909 Epilepsy, unspecified, not intractable, without status epilepticus: Secondary | ICD-10-CM | POA: Insufficient documentation

## 2013-03-01 DIAGNOSIS — S99929A Unspecified injury of unspecified foot, initial encounter: Secondary | ICD-10-CM | POA: Insufficient documentation

## 2013-03-01 DIAGNOSIS — Z79899 Other long term (current) drug therapy: Secondary | ICD-10-CM | POA: Insufficient documentation

## 2013-03-01 DIAGNOSIS — F17200 Nicotine dependence, unspecified, uncomplicated: Secondary | ICD-10-CM | POA: Insufficient documentation

## 2013-03-01 DIAGNOSIS — Z8781 Personal history of (healed) traumatic fracture: Secondary | ICD-10-CM | POA: Insufficient documentation

## 2013-03-01 DIAGNOSIS — S8990XA Unspecified injury of unspecified lower leg, initial encounter: Secondary | ICD-10-CM | POA: Insufficient documentation

## 2013-03-01 MED ORDER — NAPROXEN 500 MG PO TABS: 500.0000 mg | ORAL_TABLET | Freq: Two times a day (BID) | ORAL | Status: DC
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
## 2013-03-02 NOTE — ED Provider Notes (Signed)
Medical screening examination/treatment/procedure(s) were performed by non-physician practitioner and as supervising physician I was immediately available for consultation/collaboration.   Carleene Cooper III, MD 03/02/13 [phone]

## 2013-10-21 ENCOUNTER — Emergency Department (HOSPITAL_COMMUNITY): Payer: Self-pay

## 2013-10-21 ENCOUNTER — Encounter (HOSPITAL_COMMUNITY): Payer: Self-pay | Admitting: Emergency Medicine

## 2013-10-21 ENCOUNTER — Emergency Department (HOSPITAL_COMMUNITY)
Admission: EM | Admit: 2013-10-21 | Discharge: 2013-10-24 | Disposition: A | Discharge status: Home / Self Care | Payer: Self-pay | Attending: Emergency Medicine | Admitting: Emergency Medicine

## 2013-10-21 DIAGNOSIS — R519 Headache, unspecified: Secondary | ICD-10-CM

## 2013-10-21 DIAGNOSIS — R5381 Other malaise: Secondary | ICD-10-CM | POA: Insufficient documentation

## 2013-10-21 DIAGNOSIS — F3289 Other specified depressive episodes: Secondary | ICD-10-CM | POA: Insufficient documentation

## 2013-10-21 DIAGNOSIS — R45850 Homicidal ideations: Secondary | ICD-10-CM | POA: Insufficient documentation

## 2013-10-21 DIAGNOSIS — F329 Major depressive disorder, single episode, unspecified: Secondary | ICD-10-CM | POA: Insufficient documentation

## 2013-10-21 DIAGNOSIS — F39 Unspecified mood [affective] disorder: Secondary | ICD-10-CM | POA: Insufficient documentation

## 2013-10-21 DIAGNOSIS — Z880 Allergy status to penicillin: Secondary | ICD-10-CM | POA: Insufficient documentation

## 2013-10-21 DIAGNOSIS — R6883 Chills (without fever): Secondary | ICD-10-CM | POA: Insufficient documentation

## 2013-10-21 DIAGNOSIS — R51 Headache: Secondary | ICD-10-CM | POA: Insufficient documentation

## 2013-10-21 DIAGNOSIS — R5383 Other fatigue: Secondary | ICD-10-CM

## 2013-10-21 DIAGNOSIS — Z3202 Encounter for pregnancy test, result negative: Secondary | ICD-10-CM | POA: Insufficient documentation

## 2013-10-21 DIAGNOSIS — R41840 Attention and concentration deficit: Secondary | ICD-10-CM | POA: Insufficient documentation

## 2013-10-21 DIAGNOSIS — Z87828 Personal history of other (healed) physical injury and trauma: Secondary | ICD-10-CM | POA: Insufficient documentation

## 2013-10-21 DIAGNOSIS — F411 Generalized anxiety disorder: Secondary | ICD-10-CM | POA: Insufficient documentation

## 2013-10-21 DIAGNOSIS — Z8781 Personal history of (healed) traumatic fracture: Secondary | ICD-10-CM | POA: Insufficient documentation

## 2013-10-21 DIAGNOSIS — F17200 Nicotine dependence, unspecified, uncomplicated: Secondary | ICD-10-CM | POA: Insufficient documentation

## 2013-10-21 DIAGNOSIS — Z8541 Personal history of malignant neoplasm of cervix uteri: Secondary | ICD-10-CM | POA: Insufficient documentation

## 2013-10-21 LAB — URINALYSIS, ROUTINE W REFLEX MICROSCOPIC
GLUCOSE, UA: NEGATIVE mg/dL
Hgb urine dipstick: NEGATIVE
KETONES UR: NEGATIVE mg/dL
LEUKOCYTES UA: NEGATIVE
Nitrite: NEGATIVE
PH: 5.5 (ref 5.0–8.0)
Protein, ur: NEGATIVE mg/dL
Specific Gravity, Urine: 1.019 (ref 1.005–1.030)
Urobilinogen, UA: 1 mg/dL (ref 0.0–1.0)

## 2013-10-21 LAB — COMPREHENSIVE METABOLIC PANEL
ALBUMIN: 4.1 g/dL (ref 3.5–5.2)
ALK PHOS: 87 U/L (ref 39–117)
ALT: 20 U/L (ref 0–35)
AST: 25 U/L (ref 0–37)
BUN: 9 mg/dL (ref 6–23)
CO2: 24 mEq/L (ref 19–32)
Calcium: 9.5 mg/dL (ref 8.4–10.5)
Chloride: 101 mEq/L (ref 96–112)
Creatinine, Ser: 0.87 mg/dL (ref 0.50–1.10)
GFR calc Af Amer: >90 mL/min (ref >90)
GFR calc non Af Amer: 82 mL/min — ABNORMAL LOW (ref >90)
Glucose, Bld: 119 mg/dL — ABNORMAL HIGH (ref 70–99)
POTASSIUM: 3.6 meq/L — AB (ref 3.7–5.3)
SODIUM: 141 meq/L (ref 137–147)
TOTAL PROTEIN: 7.8 g/dL (ref 6.0–8.3)
Total Bilirubin: 0.3 mg/dL (ref 0.3–1.2)

## 2013-10-21 LAB — RAPID URINE DRUG SCREEN, HOSP PERFORMED
Amphetamines: NOT DETECTED
BARBITURATES: NOT DETECTED
Benzodiazepines: NOT DETECTED
Cocaine: NOT DETECTED
Opiates: NOT DETECTED
Tetrahydrocannabinol: NOT DETECTED

## 2013-10-21 LAB — CBC WITH DIFFERENTIAL/PLATELET
BASOS PCT: 0 % (ref 0–1)
Basophils Absolute: 0 10*3/uL (ref 0.0–0.1)
EOS ABS: 0 10*3/uL (ref 0.0–0.7)
Eosinophils Relative: 0 % (ref 0–5)
HCT: 42.4 % (ref 36.0–46.0)
Hemoglobin: 15.3 g/dL — ABNORMAL HIGH (ref 12.0–15.0)
Lymphocytes Relative: 23 % (ref 12–46)
Lymphs Abs: 3.2 10*3/uL (ref 0.7–4.0)
MCH: 30.8 pg (ref 26.0–34.0)
MCHC: 36.1 g/dL — AB (ref 30.0–36.0)
MCV: 85.5 fL (ref 78.0–100.0)
Monocytes Absolute: 0.9 10*3/uL (ref 0.1–1.0)
Monocytes Relative: 6 % (ref 3–12)
NEUTROS PCT: 70 % (ref 43–77)
Neutro Abs: 9.4 10*3/uL — ABNORMAL HIGH (ref 1.7–7.7)
PLATELETS: 482 10*3/uL — AB (ref 150–400)
RBC: 4.96 MIL/uL (ref 3.87–5.11)
RDW: 12.8 % (ref 11.5–15.5)
WBC: 13.5 10*3/uL — ABNORMAL HIGH (ref 4.0–10.5)

## 2013-10-21 LAB — POCT PREGNANCY, URINE: PREG TEST UR: NEGATIVE

## 2013-10-21 MED ORDER — DIPHENHYDRAMINE HCL 50 MG/ML IJ SOLN
50.0000 mg | Freq: Once | INTRAMUSCULAR | Status: AC
  Administered 2013-10-21: 50 mg via INTRAVENOUS
  Filled 2013-10-21: qty 1

## 2013-10-21 MED ORDER — ACETAMINOPHEN 325 MG PO TABS
650.0000 mg | ORAL_TABLET | ORAL | Status: DC | PRN
  Administered 2013-10-23 (×2): 650 mg via ORAL
  Filled 2013-10-21 (×2): qty 2

## 2013-10-21 MED ORDER — SODIUM CHLORIDE 0.9 % IV BOLUS (SEPSIS)
1000.0000 mL | Freq: Once | INTRAVENOUS | Status: AC
  Administered 2013-10-21: 1000 mL via INTRAVENOUS

## 2013-10-21 MED ORDER — IBUPROFEN 800 MG PO TABS
800.0000 mg | ORAL_TABLET | Freq: Once | ORAL | Status: AC
  Administered 2013-10-21: 800 mg via ORAL
  Filled 2013-10-21: qty 1

## 2013-10-21 MED ORDER — PROCHLORPERAZINE EDISYLATE 5 MG/ML IJ SOLN
10.0000 mg | Freq: Four times a day (QID) | INTRAMUSCULAR | Status: DC | PRN
  Administered 2013-10-21: 10 mg via INTRAVENOUS
  Filled 2013-10-21: qty 2

## 2013-10-21 MED ORDER — IBUPROFEN 400 MG PO TABS
600.0000 mg | ORAL_TABLET | Freq: Three times a day (TID) | ORAL | Status: DC | PRN
  Administered 2013-10-22 – 2013-10-23 (×3): 600 mg via ORAL
  Filled 2013-10-21 (×4): qty 1

## 2013-10-21 MED ORDER — PROCHLORPERAZINE MALEATE 10 MG PO TABS
10.0000 mg | ORAL_TABLET | Freq: Three times a day (TID) | ORAL | Status: DC | PRN
  Filled 2013-10-21: qty 1
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
## 2013-10-21 NOTE — ED Notes (Signed)
Family updated on visitation hours

## 2013-10-21 NOTE — ED Notes (Addendum)
Family contacts- Vance Peper, Teodora Medici [phone]

## 2013-10-21 NOTE — ED Notes (Signed)
Pt reports "feels like her head is about to bust." family reports pt having fatigue, not eating or drinking x 2 days. Pt appears very anxious at triage. Family reports hx of seizure, does not take seizure meds. Pt denies any SI or HI. HR 128 at triage.

## 2013-10-21 NOTE — ED Notes (Signed)
telepsych set up.
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
## 2013-10-22 MED ORDER — NICOTINE 21 MG/24HR TD PT24
21.0000 mg | MEDICATED_PATCH | Freq: Every day | TRANSDERMAL | Status: DC
  Administered 2013-10-22 – 2013-10-23 (×2): 21 mg via TRANSDERMAL
  Filled 2013-10-22: qty 1
  Filled 2013-10-22: qty 14

## 2013-10-22 MED ORDER — PROCHLORPERAZINE MALEATE 10 MG PO TABS
10.0000 mg | ORAL_TABLET | Freq: Once | ORAL | Status: AC
  Administered 2013-10-22: 10 mg via ORAL

## 2013-10-22 NOTE — ED Notes (Signed)
Pt reports pain to IV site and headache. IV removed and pain meds administered. nad noted at this time.

## 2013-10-22 NOTE — ED Notes (Signed)
Patient is resting.  She will respond appropriately to questions.  Sitter at bedside.  Patient has not eaten her lunch tray.  She did place an order for her dinner tray.

## 2013-10-22 NOTE — ED Notes (Signed)
Patient's mother called to ask about patient.  I advised can't release information about patient.  Mother got very upset and stated "whatever, bye".

## 2013-10-22 NOTE — ED Notes (Signed)
[facility] with [facility] at [facility]. She advises that it doesn't look like pt will get a bed today at their facility. Will make pt aware when she awakens
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
## 2013-10-22 NOTE — Progress Notes (Signed)
B.Zahira Brummond, MHT completed placement search by contacting the following facilities listed below; [facility] at [facility] at capacity with no availability on any level [facility] left voice message inquiring of bed availability [facility] spoke with Kerry Dory who reports at capacity but will review referral, referral faxed [facility] faxed for review [facility] faxed for review

## 2013-10-22 NOTE — Progress Notes (Signed)
MHT continued placement for inpatient treatment.  The following hospitals were contacted with bed availability:  1)[facility]-refaxed referral 2)[facility]-no answer 3)[facility]-still under review 4)[facility]-faxed referral 5)[facility]-faxed referral 6)[facility]-faxed referral 7)[facility]-faxed referral 8)[facility]-faxed referral 9)[facility]-faxed referral  Wyvonnia Dusky, MHT/NS

## 2013-10-22 NOTE — ED Notes (Signed)
Patient ambulated to restroom and tolerated well.  Sitter at side with patient.

## 2013-10-23 MED ORDER — METOCLOPRAMIDE HCL 5 MG/ML IJ SOLN
10.0000 mg | Freq: Once | INTRAMUSCULAR | Status: AC
  Administered 2013-10-23: 10 mg via INTRAMUSCULAR
  Filled 2013-10-23: qty 2

## 2013-10-23 MED ORDER — DIPHENHYDRAMINE HCL 50 MG/ML IJ SOLN
25.0000 mg | Freq: Once | INTRAMUSCULAR | Status: AC
  Administered 2013-10-23: 25 mg via INTRAMUSCULAR
  Filled 2013-10-23: qty 1

## 2013-10-23 NOTE — ED Notes (Signed)
Dinner Tray ordered for pt

## 2013-10-23 NOTE — Progress Notes (Signed)
1478 Pt declined at [facility] d/t staffing and high acuity at their facility.   Rick Duff Disposition MHT

## 2013-10-23 NOTE — ED Provider Notes (Signed)
I saw and evaluated the patient, reviewed the resident's note and I agree with the findings and plan.  EKG Interpretation   None       Pt with vague somatic complaints, but also endorses homicidal ideation. Moved to Pod C pending Psych eval.   Layann Bluett B. Karle Starch, MD 10/23/13 [phone]

## 2013-10-23 NOTE — ED Notes (Signed)
Pt given ice pack for headache.  
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
## 2013-10-23 NOTE — Progress Notes (Signed)
Follow-up calls were placed to the following facilities:  [facility] per Johnney Ou pt has not been reviewed yet [facility]- per Juliann Pulse had not received referral but asked it be re-faxed, referral faxed [facility].- per Lisabeth Pick MD currently reviewing, will call back for follow-up info [facility]- per [place] received referral, currently have no beds available for the weekend but could will call TTS back regarding their wait list [facility]- per Minna Merritts they currently have no beds available and pt has been declined d/t behavioral acuity [facility]- per Magda Paganini no beds available [facility]- per Thayer Headings stated they were waiting on results from CT scan before presenting to their MD, results faxed [facility]- per Anderson Malta, received referral but do not have a bed for her right now, at [place] Disposition MHT

## 2013-10-23 NOTE — BH Assessment (Signed)
[facility] Assessment Progress Note   Called C-POD at [facility] and spoke with Santiago Glad, scheduled a tele-assessment with the Patient at 0830 today.

## 2013-10-23 NOTE — ED Notes (Signed)
Mom at nurses station requesting pain medication for daughter for a headache.  Mother updated that we can give Tylenol q4hours and Ibuprofen q8 hours.  Will give Tylenol at 1pm for headache.

## 2013-10-23 NOTE — ED Notes (Signed)
Ivy with [employer] called. [facility] monitor placed in the room for an 8:30 assessment.

## 2013-10-24 ENCOUNTER — Encounter (HOSPITAL_COMMUNITY): Payer: Self-pay | Admitting: *Deleted

## 2013-10-24 ENCOUNTER — Inpatient Hospital Stay (HOSPITAL_COMMUNITY)
Admission: AD | Admit: 2013-10-24 | Discharge: 2013-10-30 | DRG: 885 | Disposition: A | Discharge status: Home / Self Care | Payer: No Typology Code available for payment source | Source: Intra-hospital | Attending: Psychiatry | Admitting: Psychiatry

## 2013-10-24 DIAGNOSIS — Z8541 Personal history of malignant neoplasm of cervix uteri: Secondary | ICD-10-CM

## 2013-10-24 DIAGNOSIS — F411 Generalized anxiety disorder: Secondary | ICD-10-CM | POA: Diagnosis present

## 2013-10-24 DIAGNOSIS — F4310 Post-traumatic stress disorder, unspecified: Secondary | ICD-10-CM | POA: Diagnosis present

## 2013-10-24 DIAGNOSIS — Z79899 Other long term (current) drug therapy: Secondary | ICD-10-CM

## 2013-10-24 DIAGNOSIS — F17200 Nicotine dependence, unspecified, uncomplicated: Secondary | ICD-10-CM | POA: Diagnosis present

## 2013-10-24 DIAGNOSIS — F323 Major depressive disorder, single episode, severe with psychotic features: Principal | ICD-10-CM | POA: Diagnosis present

## 2013-10-24 HISTORY — DX: Anxiety disorder, unspecified: F41.9

## 2013-10-24 MED ORDER — MAGNESIUM HYDROXIDE 400 MG/5ML PO SUSP: 30.0000 mL | Freq: Every day | ORAL | Status: DC | PRN

## 2013-10-24 MED ORDER — HYDROXYZINE HCL 25 MG PO TABS
25.0000 mg | ORAL_TABLET | Freq: Three times a day (TID) | ORAL | Status: DC | PRN
  Administered 2013-10-24 – 2013-10-29 (×5): 25 mg via ORAL
  Filled 2013-10-24 (×5): qty 1

## 2013-10-24 MED ORDER — CLONAZEPAM 0.5 MG PO TABS
0.5000 mg | ORAL_TABLET | Freq: Three times a day (TID) | ORAL | Status: DC | PRN
  Administered 2013-10-24: 0.5 mg via ORAL
  Filled 2013-10-24: qty 1

## 2013-10-24 MED ORDER — ALUM & MAG HYDROXIDE-SIMETH 200-200-20 MG/5ML PO SUSP: 30.0000 mL | ORAL | Status: DC | PRN

## 2013-10-24 MED ORDER — ACETAMINOPHEN 325 MG PO TABS
650.0000 mg | ORAL_TABLET | Freq: Four times a day (QID) | ORAL | Status: DC | PRN
  Administered 2013-10-24 – 2013-10-27 (×4): 650 mg via ORAL
  Filled 2013-10-24 (×5): qty 2

## 2013-10-24 MED ORDER — LORATADINE 10 MG PO TABS
10.0000 mg | ORAL_TABLET | Freq: Every day | ORAL | Status: DC
  Administered 2013-10-24 – 2013-10-30 (×7): 10 mg via ORAL
  Filled 2013-10-24 (×11): qty 1

## 2013-10-24 MED ORDER — FLUTICASONE PROPIONATE 50 MCG/ACT NA SUSP
2.0000 | Freq: Every day | NASAL | Status: DC
  Administered 2013-10-24 – 2013-10-30 (×7): 2 via NASAL
  Filled 2013-10-24 (×2): qty 16

## 2013-10-24 MED ORDER — ESCITALOPRAM OXALATE 10 MG PO TABS
10.0000 mg | ORAL_TABLET | Freq: Every day | ORAL | Status: DC
  Administered 2013-10-24 – 2013-10-25 (×2): 10 mg via ORAL
  Filled 2013-10-24 (×4): qty 1

## 2013-10-24 MED ORDER — PSEUDOEPHEDRINE HCL 30 MG PO TABS
30.0000 mg | ORAL_TABLET | Freq: Two times a day (BID) | ORAL | Status: DC
  Administered 2013-10-24 – 2013-10-25 (×2): 30 mg via ORAL
  Filled 2013-10-24 (×5): qty 1
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
## 2013-10-24 NOTE — Progress Notes (Signed)
Adult Psychoeducational Group Note  Date:  10/24/2013 Time:  5:25 PM  Group Topic/Focus:  Therapeutic Activity   Participation Level:  Did Not Attend  Elisha Headland 10/24/2013, 5:25 PM

## 2013-10-24 NOTE — Progress Notes (Signed)
Psychoeducational Group Note  Date:  10/24/2013 Time:  1015  Group Topic/Focus:  Making Healthy Choices:   The focus of this group is to help patients identify negative/unhealthy choices they were using prior to admission and identify positive/healthier coping strategies to replace them upon discharge.  Participation Level:  Active  Participation Quality:  Appropriate  Affect:  Appropriate  Cognitive:  Oriented  Insight:  Improving  Engagement in Group:  Engaged  Additional Comments:    Janssen Zee A 10/24/2013 
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
## 2013-10-24 NOTE — Progress Notes (Signed)
Adult Psychoeducational Group Note  Date:  10/24/2013 Time:  9:10 PM  Group Topic/Focus:  . Wrap-Up Group:   The focus of this group is to help patients review their daily goal of treatment and discuss progress on daily workbooks.  Participation Level:  Active  Participation Quality:  Appropriate  Affect:  Appropriate  Cognitive:  Appropriate  Insight: Appropriate  Engagement in Group:  Engaged  Modes of Intervention:  Discussion  Additional Comments:  The patient expressed that in group she learned skills that opened doors to l;ife.  Nash Shearer 10/24/2013, 9:10 PM

## 2013-10-24 NOTE — Progress Notes (Signed)
Mary Benson is seen OOB UAL on the 500 hall today...she tolerates this fairly well. She is sad, depressed and keeps to herself .    A SHe reported  Having a headache and was given 2 tylenol earlier this morning, with relief noted by pt 1 hr later.    R Safety is in place and pt contracts for safety.

## 2013-10-24 NOTE — BH Assessment (Signed)
Per Okey Regal, [facility] at [facility], adult unit is at capacity. Contacted the following facilities for placement:  [facility]: At capacity [facility]: At [place]: No answer [facility]: At [facility]: At [place]: At [place]: At [place]: At [place]: At [place]: At [place]: Unable to accept self-pay from [facility]: Pt is still under review.   Orpah Greek Rosana Hoes, [facility] Triage Specialist
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
## 2013-10-24 NOTE — BH Assessment (Signed)
Pt was ran by Waylan Boga NP, pt was accepted to [facility] 500 hall. Becky Warehouse manager at [employer], made aware. Pt will be going to [facility], per [facility].
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
## 2013-10-24 NOTE — [facility] Group Notes (Deleted)
[place] LCSW Group Therapy Note   10/24/2013  1:15 To 2:15 PM   Type of Therapy and Topic: Group Therapy: Feelings Around Returning Home & Establishing a Supportive Framework and Activity to Identify signs of Improvement or Decompensation   Participation Level: Minimal  Mood:  Flat  Description of Group:  Patients first processed thoughts and feelings about up coming discharge. These included fears of upcoming changes, lack of change, new living environments, judgements and expectations from others and overall stigma of MH issues. We then discussed what is a supportive framework? What does it look like feel like and how do I discern it from and unhealthy non-supportive network? Learn how to cope when supports are not helpful and don't support you. Discuss what to do when your family/friends are not supportive.   Therapeutic Goals Addressed in Processing Group:  1. Patient will identify one healthy supportive network that they can use at discharge. 2. Patient will identify one factor of a supportive framework and how to tell it from an unhealthy network. 3. Patient able to identify one coping skill to use when they do not have positive supports from others. 4. Patient will demonstrate ability to communicate their needs through discussion and/or role plays.  Summary of Patient Progress:  Pt more hesitant to engage during group session today verses yesterday. As patients processed their anxiety about discharge and described healthy supports Virdia was drawing/writing in journal before being called out to meet with medical staff. Pt did express desire to experience more freedom and lightheartedness upon discharge.    Sheilah Pigeon, LCSW

## 2013-10-24 NOTE — Progress Notes (Deleted)
Psychoeducational Group Note  Date: 10/24/2013 Time: 0930  Group Topic/Focus:  Gratefulness:  The focus of this group is to help patients identify what two things they are most grateful for in their lives. What helps ground them and to center them on their work to their recovery.  Participation Level:  Active  Participation Quality:  Attentive  Affect:  Appropriate  Cognitive:  Appropriate  Insight:  Improving  Engagement in Group:  Engaged  Additional Comments:    Herminia Warren A  

## 2013-10-25 MED ORDER — NICOTINE 21 MG/24HR TD PT24
21.0000 mg | MEDICATED_PATCH | Freq: Every day | TRANSDERMAL | Status: DC
  Administered 2013-10-26 – 2013-10-30 (×5): 21 mg via TRANSDERMAL
  Filled 2013-10-25 (×8): qty 1

## 2013-10-25 MED ORDER — PSEUDOEPHEDRINE HCL 30 MG PO TABS: 30.0000 mg | ORAL_TABLET | Freq: Three times a day (TID) | ORAL | Status: DC | PRN

## 2013-10-25 MED ORDER — ESCITALOPRAM OXALATE 20 MG PO TABS
20.0000 mg | ORAL_TABLET | Freq: Every day | ORAL | Status: DC
  Administered 2013-10-26 – 2013-10-30 (×5): 20 mg via ORAL
  Filled 2013-10-25: qty 2
  Filled 2013-10-25 (×3): qty 14
  Filled 2013-10-25 (×6): qty 1

## 2013-10-25 MED ORDER — MIRTAZAPINE 15 MG PO TABS
7.5000 mg | ORAL_TABLET | Freq: Every day | ORAL | Status: DC
  Administered 2013-10-25: 7.5 mg via ORAL
  Filled 2013-10-25 (×2): qty 0.5

## 2013-10-25 MED ORDER — IBUPROFEN 600 MG PO TABS
600.0000 mg | ORAL_TABLET | Freq: Four times a day (QID) | ORAL | Status: DC | PRN
  Administered 2013-10-25: 600 mg via ORAL
  Filled 2013-10-25: qty 1

## 2013-10-25 NOTE — Progress Notes (Signed)
Patient ID: Mary Benson, female   DOB: June 10, 1971, 43 y.o.   MRN: [id number] D- Patient has been feeling anxiuos and was upset about not feeling that she was being heard.  A- Talked with patient about asking for what she needs and repeating it if necessary.  Gave patient a journal book to list things she needs and wants.  Role played patient asking to be listened to.  Also taught patient abdominal deep breathing. R-  Patient attending groups and participating.

## 2013-10-25 NOTE — Progress Notes (Signed)
Recreation Therapy Notes  Date: 01.26.2015 Time: 2:45pm Location: [address] Dayroom   Group Topic: Wellness  Goal Area(s) Addresses:  Patient will identify dimension of wellness they most struggle with.  Patient will identify at least 2 ways to invest in that type of wellness.   Behavioral Response: Engaged, Attentive, Appropraite    Intervention: Informational worksheet  Activity: Patients were provided a worksheet outlining the 6 dimensions of wellness - Physical, Emotional, Social, Emotional, Environmental, & Spiritual.  As a group patients were asked to identify examples of each dimension, individually patients were asked to identify at least 2 things they can do post d/c to invest in each dimension of wellness.   Education: Wellness, Discharge Planning  Education Outcome: Acknowledges understanding   Clinical Observations/Feedback: Patient actively engaged in activity, from both group and individual perspective. Patient inquired about specific strategies to reducing her stress and investing in her wellness. LRT suggested patient investigate coping skills specifically for anxiety, patient requested a list. LRT offered patient individual counseling prior to d/c on deep breathing and progressive muscle relaxation. Patient open to LRT providing instruction and education. Patient spoke openly about her life "falling apart." LRT encouraged patient to use this experience as motivation to improve her life, patient receptive to this concept, but appeared apprehensive about application.   Laureen Ochs Faye Sanfilippo, LRT/CTRS  Lane Hacker 10/25/2013 4:49 PM
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
## 2013-10-25 NOTE — BHH Suicide Risk Assessment (Signed)
[place] INPATIENT:  Family/Significant Other Suicide Prevention Education  Suicide Prevention Education:  Education Completed; Vance Peper - mother [phone]),  (name of family member/significant other) has been identified by the patient as the family member/significant other with whom the patient will be residing, and identified as the person(s) who will aid the patient in the event of a mental health crisis (suicidal ideations/suicide attempt).  With written consent from the patient, the family member/significant other has been provided the following suicide prevention education, prior to the and/or following the discharge of the patient.  The suicide prevention education provided includes the following:  Suicide risk factors  Suicide prevention and interventions  National Suicide Hotline telephone number  [facility] assessment telephone number  [facility] Emergency Assistance [place] and/or Residential Mobile Crisis Unit telephone number  Request made of family/significant other to:  Remove weapons (e.g., guns, rifles, knives), all items previously/currently identified as safety concern.    Remove drugs/medications (over-the-counter, prescriptions, illicit drugs), all items previously/currently identified as a safety concern.  The family member/significant other verbalizes understanding of the suicide prevention education information provided.  The family member/significant other agrees to remove the items of safety concern listed above.  Ane Payment 10/25/2013, 12:52 PM
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
## 2013-10-25 NOTE — Tx Team (Signed)
Interdisciplinary Treatment Plan Update (Adult)  Date: 10/25/2013  Time Reviewed:  9:45 AM  Progress in Treatment: Attending groups: Yes Participating in groups:  Yes Taking medication as prescribed:  Yes Tolerating medication:  Yes Family/Significant othe contact made: CSW assessing  Patient understands diagnosis:  Yes Discussing patient identified problems/goals with staff:  Yes Medical problems stabilized or resolved:  Yes Denies suicidal/homicidal ideation: Yes Issues/concerns per patient self-inventory:  Yes Other:  New problem(s) identified: N/A  Discharge Plan or Barriers: CSW assessing for appropriate referrals.  Reason for Continuation of Hospitalization: Anxiety Depression Medication Stabilization  Comments: N/A  Estimated length of stay: 2-4 days  For review of initial/current patient goals, please see plan of care.  Attendees: Patient:     Family:     Physician:  Dr. Johnalagadda 10/25/2013 10:10 AM   Nursing:   Beverly Knight, RN 10/25/2013 10:10 AM   Clinical Social Worker:  Tallis Soledad Horton, LCSW 10/25/2013 10:10 AM   Other: Conrad Withrow, PA 10/25/2013 10:10 AM   Other:  Valerie Noch, care coordination 10/25/2013 10:10 AM   Other:  Quylle Hodnett, LCSW 10/25/2013 10:10 AM   Other:  Carol Davis, RN 10/25/2013 10:11 AM   Other:  Jennifer Clark, case manager 10/25/2013 10:10 AM   Other:    Other:    Other:    Other:    Other:     Scribe for Treatment Team:   Horton, Eliakim Tendler Nicole, 10/25/2013 10:10 AM   
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
## 2013-10-25 NOTE — BHH Group Notes (Signed)
[facility] LCSW Aftercare Discharge Planning Group Note   10/25/2013 8:45 AM  Participation Quality:  Alert, Appropriate and Oriented  Mood/Affect:  Flat and Depressed  Depression Rating:  9  Anxiety Rating:  5  Thoughts of Suicide:  Pt denies SI/HI  Will you contract for safety?   Yes  Current AVH:  Pt denies  Plan for Discharge/Comments:  Pt attended discharge planning group and actively participated in group.  CSW provided pt with today's workbook.  Pt reports coming to the hospital for depression and continues to feel depressed today.  Pt will return home in [place].  Pt doesn't have any follow up.  CSW will make appropriate referrals.  No further needs voiced by pt at this time.    Transportation Means: Pt reports access to transportation - mom will pick pt up  Supports: No supports mentioned at this time  Regan Lemming, LCSW 10/25/2013 10:04 AM

## 2013-10-25 NOTE — Progress Notes (Signed)
Adult Psychoeducational Group Note  Date:  10/25/2013 Time:  10:37 PM  Group Topic/Focus:  Goals Group:   The focus of this group is to help patients establish daily goals to achieve during treatment and discuss how the patient can incorporate goal setting into their daily lives to aide in recovery.  Participation Level:  Active  Participation Quality:  Appropriate  Affect:  Appropriate  Cognitive:  Appropriate  Insight: Appropriate  Engagement in Group:  Engaged  Modes of Intervention:  Discussion  Additional Comments:  Pt stated her day improved a lot she found some new opportunities and resources that she can use here and when she leaves.  Olena Leatherwood 10/25/2013, 10:37 PM

## 2013-10-25 NOTE — Progress Notes (Signed)
D. Pt has been up and visible in milieu today, attending and participating in various milieu activities. Pt had complained of a headache today and requested and received ibuprofen this evening. Pt also complained of on-going insomnia and received medication to help. A. Pt provided with support and encouragement and medication education. R. Pt verbalized understanding, will continue to monitor.

## 2013-10-26 DIAGNOSIS — F332 Major depressive disorder, recurrent severe without psychotic features: Secondary | ICD-10-CM

## 2013-10-26 MED ORDER — TRAZODONE HCL 50 MG PO TABS
50.0000 mg | ORAL_TABLET | Freq: Every evening | ORAL | Status: DC | PRN
  Administered 2013-10-26 – 2013-10-28 (×3): 50 mg via ORAL
  Filled 2013-10-26 (×10): qty 1

## 2013-10-26 NOTE — Progress Notes (Signed)
The focus of this group is to educate the patient on the purpose and policies of crisis stabilization and provide a format to answer questions about their admission.  The group details unit policies and expectations of patients while admitted.Patient attended group and practiced deep breathing excercises  With group.  She was attentive during discussion but did not contribute verbally

## 2013-10-26 NOTE — Progress Notes (Signed)
D: pt states her mind is constantly racing and it is hard for her to concentrate longer than 30 minutes. Pt stated she has had this problem for awhile and just wants something to help stop it. Pt became teary eyed while talking to writer about being frustrated on not being able to concentrate and focus for long periods of times.Pt is very anxious and depressed. Denies si/hi/avh. denies pain. PA notified of pt racing thoughts and anxiety. Pt also c/o of lack of sleep, Trazdone 50mg  and repeat does ordered.  A: 1:1 time given. Support and encouragement. q 15 min safety checks R: pt remains safe on unit. No further c/o at this time

## 2013-10-26 NOTE — Progress Notes (Signed)
Recreation Therapy Notes  Animal-Assisted Activity/Therapy (AAA/T) Program Checklist/Progress Notes Patient Eligibility Criteria Checklist & Daily Group note for Rec Tx Intervention  Date: 01.27.2015 Time: 2:45pm Location: [address] Dayroom    AAA/T Program Assumption of Risk Form signed by Patient/ or Parent Legal Guardian yes  Patient is free of allergies or sever asthma yes  Patient reports no fear of animals yes  Patient reports no history of cruelty to animals yes   Patient understands his/her participation is voluntary yes  Patient washes hands before animal contact yes  Patient washes hands after animal contact yes  Behavioral Response: Engaged, Appropriate   Education: Hand Washing, Appropriate Animal Interaction   Education Outcome: Acknowledges understanding   Clinical Observations/Feedback: Patient interacted appropriately with therapeutic dog team, peers and LRT.   Mary Benson, LRT/CTRS  Mary Benson 10/26/2013 5:36 PM 
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
## 2013-10-26 NOTE — Progress Notes (Signed)
Writer checked on patient at 2330; she is resting quietly with eyes closed. Respirations even and unlabored. No distress noted. Q 15 minute check continues as ordered to maintain safety.

## 2013-10-26 NOTE — Progress Notes (Signed)
Patient ID: Mary Benson, female   DOB: [birth year], 43 y.o.   MRN: [id number] D- Patient reports her sleep was very poor and restless all night.  Her appetite is poor and her energy level is low.  Her ability to pay attention is improving, but she says that her thoughts constantly race around and sometimes she only hears hald of what is being sid because of this.  She denies SI/HI.  She feels overwhelmed when confronted by people and get her feelings hurt often and then becomes tearful. She also says that at her siblings frequently "dump on me and then call me stupid when I try to talk about it"  A- Talked with patient about setting boundaries and consistently using positive self talk.  R- Patient is eager to learn new ways of coping with new problems.  Marland Kitchen

## 2013-10-26 NOTE — Progress Notes (Signed)
Patient resting quietly with eyes closed. Respirations even and unlabored. No distress noted, Q 15 minute check continues as ordered to maintain safety.  

## 2013-10-26 NOTE — [facility] Group Notes (Signed)
[facility] LCSW Group Therapy  10/26/2013  1:15 PM   Type of Therapy:  Group Therapy  Participation Level:  Active  Participation Quality:  Attentive, Sharing and Supportive  Affect:  Depressed and Flat  Cognitive:  Alert and Oriented  Insight:  Developing/Improving and Engaged  Engagement in Therapy:  Developing/Improving and Engaged  Modes of Intervention:  Activity, Clarification, Confrontation, Discussion, Education, Exploration, Limit-setting, Orientation, Problem-solving, Rapport Building, Reality Testing, Socialization and Support  Summary of Progress/Problems: Patient was attentive and engaged with speaker from [employer].  Patient was attentive to speaker while they shared their story of dealing with mental health and overcoming it.  Patient expressed interest in their programs and services and received information on their agency.  Patient processed ways they can relate to the speaker.     Zarion Oliff Horton, LCSW 10/26/2013 1:26 PM   

## 2013-10-26 NOTE — Progress Notes (Signed)
Adult Psychoeducational Group Note  Date:  10/26/2013 Time:  8:00 pm  Group Topic/Focus:  Wrap-Up Group:   The focus of this group is to help patients review their daily goal of treatment and discuss progress on daily workbooks.  Participation Level:  Active  Participation Quality:  Appropriate and Sharing  Affect:  Appropriate  Cognitive:  Appropriate  Insight: Appropriate  Engagement in Group:  Engaged  Modes of Intervention:  Discussion, Education, Socialization and Support  Additional Comments:  Pt stated that she has learned to not let her past rob her future. Pt stated that she needs to learn from the lessons of her past. Pt stated that she is reliable and has a big heart.   Wynetta Emery, Tauren Delbuono 10/26/2013, 9:54 PM

## 2013-10-27 MED ORDER — IBUPROFEN 800 MG PO TABS
800.0000 mg | ORAL_TABLET | Freq: Three times a day (TID) | ORAL | Status: DC
  Administered 2013-10-27 – 2013-10-29 (×7): 800 mg via ORAL
  Filled 2013-10-27 (×14): qty 1

## 2013-10-27 NOTE — Progress Notes (Signed)
Adult Psychoeducational Group Note  Date:  10/27/2013 Time:  2:01 PM  Group Topic/Focus:  Personal Choices and Values:   The focus of this group is to help patients assess and explore the importance of values in their lives, how their values affect their decisions, how they express their values and what opposes their expression.  Participation Level:  Active  Participation Quality:  Appropriate  Affect:  Appropriate  Cognitive:  Appropriate  Insight: Appropriate  Engagement in Group:  Engaged  Modes of Intervention:  Discussion  Additional Comments:  Pt. Participated in group and shared  Mary Benson 10/27/2013, 2:01 PM

## 2013-10-27 NOTE — Progress Notes (Signed)
Patient in her room at the beginning of this shift. Her mood and affect flat and depressed. Her boy friend visited. She reported a better day today; "I slept good last night and I still feel a little drowsy when I woke up in the morning". She denied SI/HI and denied hallucinations. Writer encouraged and supported patient. Endorsed having relief on the pain on her rt hand and requested for more ice. Patient receptive to encouragement and support. Q 15 minute check continues as ordered to maintain safety.

## 2013-10-27 NOTE — Progress Notes (Signed)
Adult Psychoeducational Group Note  Date:  10/27/2013 Time:  8:48 PM  Group Topic/Focus:  Wrap-Up Group:   The focus of this group is to help patients review their daily goal of treatment and discuss progress on daily workbooks.  Participation Level:  Active  Participation Quality:  Appropriate  Affect:  Appropriate  Cognitive:  Appropriate  Insight: Appropriate  Engagement in Group:  Engaged  Modes of Intervention:  Support  Additional Comments:  Pt stated that she was able to talk to her son and that she has learned that he will be able to go with her. She hopes that he will be able to utilize some of the coping skills that she has been learning as well.   Kailena Lubas 10/27/2013, 8:48 PM
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
## 2013-10-27 NOTE — [facility] Group Notes (Signed)
[facility] LCSW Aftercare Discharge Planning Group Note   10/27/2013 8:45 AM  Participation Quality:  Alert, Appropriate and Oriented  Mood/Affect:  Flat and Depressed  Depression Rating:  5  Anxiety Rating:  8  Thoughts of Suicide:  Pt denies SI/HI  Will you contract for safety?   Yes  Current AVH:  Pt denies  Plan for Discharge/Comments:  Pt attended discharge planning group and actively participated in group.  CSW provided pt with today's workbook.  Pt reports still not doing well today with poor concentration, which was observed by pt asking the same questions over and over again.  Pt will return home in [place].  Pt has follow up scheduled at [place] for outpatient medication management and therapy.  Pt also verbalized a plan to go to groups at [place], after hearing the speaker yesterday.  No further needs voiced by pt at this time.    Transportation Means: Pt reports access to transportation - mom will pick pt up  Supports: No supports mentioned at this time  Regan Lemming, LCSW 10/27/2013 10:30 AM

## 2013-10-27 NOTE — Tx Team (Signed)
Interdisciplinary Treatment Plan Update (Adult)  Date: 10/27/2013  Time Reviewed:  9:45 AM  Progress in Treatment: Attending groups: Yes Participating in groups:  Yes Taking medication as prescribed:  Yes Tolerating medication:  Yes Family/Significant othe contact made: Yes, with pt's mother Patient understands diagnosis:  Yes Discussing patient identified problems/goals with staff:  Yes Medical problems stabilized or resolved:  Yes Denies suicidal/homicidal ideation: Yes Issues/concerns per patient self-inventory:  Yes Other:  New problem(s) identified: N/A  Discharge Plan or Barriers: Pt has follow up scheduled at [place] for outpatient medication management and therapy.    Reason for Continuation of Hospitalization: Anxiety Depression Medication Stabilization  Comments: N/A  Estimated length of stay: 3-4 days  For review of initial/current patient goals, please see plan of care.  Attendees: Patient:    Family:     Physician:  Dr. Zorita Pang 10/27/2013 10:36 AM   Nursing:   Grayland Ormond, RN 10/27/2013 10:36 AM   Clinical Social Worker:  Regan Lemming, LCSW 10/27/2013 10:36 AM   Other: Catalina Pizza, PA 10/27/2013 10:36 AM   Other:  Gala Romney, care coordination 10/27/2013 10:36 AM   Other:  Joette Catching, LCSW 10/27/2013 10:36 AM   Other:  Gaylan Gerold, RN 10/27/2013 10:37 AM   Other:  Rosana Fret, RN 10/27/2013 10:37 AM   Other:  Lars Pinks, case manager 10/27/2013 10:37 AM   Other:    Other:    Other:      Scribe for Treatment Team:   Ane Payment, 10/27/2013 , 10:36 AM

## 2013-10-27 NOTE — Progress Notes (Signed)
Patient ID: Mary Benson, female   DOB: 19-Feb-1971, 43 y.o.   MRN: [id number]  Pt. Complains that right wrist feels sore. Two RN's looked at wrist and it was not warm to the touch or red. The area was slightly pink and where an IV had been. NP was notified and assessed area as well. NP placed order for Ibuprofen for inflammation and PRN ice pack to reduce swelling.
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
## 2013-10-28 NOTE — Progress Notes (Signed)
Adult Psychoeducational Group Note  Date:  10/28/2013 Time:  9:00 AM  Group Topic/Focus:  Morning Wellness Group  Participation Level:  Active  Participation Quality:  Appropriate and Attentive  Affect:  Appropriate  Cognitive:  Alert and Oriented  Insight: Good  Engagement in Group:  Engaged  Modes of Intervention:  Discussion  Additional Comments:  Patient voiced that a goal for her today is to not isolate to the room.  Mary Benson 10/28/2013, 12:34 PM

## 2013-10-28 NOTE — Progress Notes (Signed)
D: Patient appropriate and cooperative with staff. Patient's has sad, depressed affect and mood. She reported on the self inventory sheet that she's sleeping well, appetite and ability to pay attention are improving, and energy level is low. Patient rated depression "3" and feelings of hopelessness "2". She's participating in groups and visible in the milieu. Patient adhering to medication regimen.  A: Support and encouragement provided to patient. Administered scheduled medications per ordering MD. Monitor Q15 minute checks for safety.  R: Patient receptive. Denies SI/HI and AVH. Patient remains safe on the unit.

## 2013-10-28 NOTE — Progress Notes (Signed)
Patient ID: [name], female   DOB: 1971-04-09, 43 y.o.   MRN: [id number] Patient ID: KAILEE ESSMAN, female   DOB: December 09, 1970, 43 y.o.   MRN: [id number] [facility] MD Progress Note  10/28/2013 11:27 AM DNIYAH GRANT  MRN:  [id number]  Subjective:  Patient was admitted with major depressive disorder, suicidal and homicidal ideations and anxiety.   Patient stated that she has a mild irritability and agitation and trying to get coping skills to manage and states group therapies are helping her.patient rates depression at  4/10 and anxiety at 5/10. Patient denies current suicidal ideation homicidal ideation and has not evidence of psychosis. Patient contracts for safety while in the hospital. Patient has better sleep and appetite.patient responded to trazodone better than Remeron (d/c'd).  patient complains of trouble concentrating.    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  HENT: Positive for congestion.   Respiratory: Positive for wheezing.   Gastrointestinal: Positive for abdominal pain.  Musculoskeletal: Positive for back pain.  Skin: Negative.   Neurological: Positive for tremors and weakness.  Psychiatric/Behavioral: Positive for depression. The patient has insomnia.     Blood pressure 114/69, pulse 88, temperature 97.7 F (36.5 C), temperature source Oral, resp. rate 16, height 5\' 5"  (1.651 m), weight 69.4 kg (153 lb), SpO2 97.00%.Body mass index is 25.46 kg/(m^2).  General  Appearance: Casual,  patient has normal muscular stated activity   Eye Contact::  Fair  Speech:  Clear and Coherent and Slow, language is intact and normal    Volume:  Normal  Mood:  Euthymic  Affect:  Appropriate  Thought Process:  Goal Directed and Intact  Orientation:  Full (Time, Place, and Person)  Thought Content:  WDL  Suicidal Thoughts:  No  Homicidal Thoughts:  No  Memory:  Immediate;   Fair  Judgement:  Fair  Insight:  Lacking  Psychomotor Activity:  Psychomotor Retardation  Concentration:  [name] has good fund of knowledge.  Recall:  Fair  Akathisia:  NA  Handed:  Right  AIMS (if indicated):     Assets:  Communication Skills Desire for Improvement Financial Resources/Insurance Housing Leisure Time Physical Health Resilience Social Support Transportation  Sleep:  Number of Hours: 6.75   Current Medications: Current Facility-Administered Medications  Medication Dose Route Frequency Provider Last Rate Last Dose  . acetaminophen (TYLENOL) tablet 650 mg  650 mg Oral Q6H PRN Waldon Merl, MD   650 mg at 10/27/13 0819  . alum & mag hydroxide-simeth (MAALOX/MYLANTA) 200-200-20 MG/5ML suspension 30 mL  30 mL Oral Q4H PRN Waldon Merl, MD      . escitalopram (LEXAPRO) tablet 20 mg  20 mg Oral Daily Durward Parcel, MD   20 mg at 10/28/13 3009  . fluticasone (FLONASE) 50 MCG/ACT nasal spray 2 spray  2 spray Each Nare Daily Nanci Pina, FNP   2 spray at 10/28/13 0800  . hydrOXYzine (ATARAX/VISTARIL) tablet 25 mg  25 mg Oral TID PRN Waldon Merl, MD   25 mg at 10/27/13 0819  . ibuprofen (  ADVIL,MOTRIN) tablet 600 mg  600 mg Oral Q6H PRN Laverle Hobby, PA-C   600 mg at 10/25/13 2201  . ibuprofen (ADVIL,MOTRIN) tablet 800 mg  800 mg Oral Q8H Benjamine Mola, FNP   800 mg at 10/28/13 4656  . loratadine (CLARITIN) tablet 10 mg  10 mg Oral Daily Nanci Pina, FNP   10 mg at 10/28/13 8127  . magnesium hydroxide (MILK OF MAGNESIA) suspension 30 mL  30  mL Oral Daily PRN Waldon Merl, MD      . nicotine (NICODERM CQ - dosed in mg/24 hours) patch 21 mg  21 mg Transdermal Q0600 Durward Parcel, MD   21 mg at 10/28/13 0630  . pseudoephedrine (SUDAFED) tablet 30 mg  30 mg Oral Q8H PRN Durward Parcel, MD      . traZODone (DESYREL) tablet 50 mg  50 mg Oral QHS,MR X 1 Laverle Hobby, PA-C   50 mg at 10/27/13 2155    Lab Results: No results found for this or any previous visit (from the past 48 hour(s)).  Physical Findings: AIMS:  , ,  ,  ,    CIWA:    COWS:     Treatment Plan Summary: Daily contact with patient to assess and evaluate symptoms and progress in treatment Medication management for depression, anxiety, insomnia, poor appetite and nasal congestion  Plan: Review of chart, vital signs, medications, and notes.  1-Individual and group therapy  2-Medication management for depression and anxiety: Medications reviewed with the patient and she stated no untoward effects.  Continue Lexapro 20 mg Po Qd for depression Continue Trazodone 50 mg PO /PRN Continue Vistaril 25 mg Po TID/PRN for anxiety Continue ibuprofen 800mg  PO tid for right hand pain secondary to IV placement (no signs of infx).  3-Coping skills for depression, anxiety  4-Continue crisis stabilization and management  5-Address health issues--monitoring vital signs, stable  6-Treatment plan in progress to prevent relapse of depression and anxiety 7. Disposition plans in progress and maybe discharged tomorrow if you continue to contract for safety and continued to show clinical improvement.    Medical Decision Making Problem Points:  Established problem, stable/improving (1), New problem, with no additional work-up planned (3), Review of last therapy session (1) and Review of psycho-social stressors (1) Data Points:  Review or order clinical lab tests (1) Review or order medicine tests (1) Review of medication regiment & side effects (2) Review of  new medications or change in dosage (2)  I certify that inpatient services furnished can reasonably be expected to improve the patient's condition.   Navarre Diana,JANARDHAHA R. 10/28/2013, 11:27 AM

## 2013-10-28 NOTE — Progress Notes (Signed)
Recreation Therapy Notes  Date: 01.29.2015 Time: 2:45pm Location: [address] Dayroom   Group Topic: Leisure Education  Goal Area(s) Addresses:  Patient will identify positive leisure activities.  Patient will identify one positive benefit of participation in leisure activities.   Behavioral Response: Appropriate   Intervention: Game  Activity: Leisure ABC's. Patients were asked to identify a leisure activity to correspond with each letter of the alphabet. Group discussion focused on the use of leisure as a coping mechanism, as well as a way to build support system post d/c.   Education:  Leisure Education, Dentist, Coping Skills   Education Outcome: Acknowledges understanding  Clinical Observations/Feedback: Patient attended group session, appeared to intently listen, but did not contribute to group list or discussion.   Laureen Ochs Randy Castrejon, LRT/CTRS  Lane Hacker 10/28/2013 5:13 PM

## 2013-10-28 NOTE — BHH Group Notes (Signed)
[place] LCSW Group Therapy  10/28/2013  1:15 PM   Type of Therapy:  Group Therapy  Participation Level:  Active  Participation Quality:  Attentive and Supportive  Affect:  Depressed and Flat  Cognitive:  Alert and Oriented  Insight:  Developing/Improving and Engaged  Engagement in Therapy:  Developing/Improving and Engaged  Modes of Intervention:  Activity, Clarification, Confrontation, Discussion, Education, Exploration, Limit-setting, Orientation, Problem-solving, Rapport Building, Art therapist, Socialization and Support  Summary of Progress/Problems: Patient was attentive and engaged with speaker from [place] [facility].  Patient was attentive to speaker while they shared their story of dealing with mental health and overcoming it.  Patient expressed interest in their programs and services and received information on their agency.  Patient processed ways they can relate to the speaker.     Regan Lemming, LCSW 10/28/2013 2:26 PM

## 2013-10-28 NOTE — Progress Notes (Signed)
Adult Psychoeducational Group Note  Date:  10/28/2013 Time:  1:09 PM  Group Topic/Focus:  Stages of Change:   The focus of this group is to explain the stages of change and help patients identify changes they want to make upon discharge.  Participation Level:  Active  Participation Quality:  Attentive  Affect:  Appropriate  Cognitive:  Appropriate  Insight: Appropriate  Engagement in Group:  Engaged  Modes of Intervention:  Discussion  Additional Comments:  Pt. Participated in group. Pt stated that she needs to take more time for herself.  Sonda Primes 10/28/2013, 1:09 PM

## 2013-10-28 NOTE — Progress Notes (Signed)
Adult Psychoeducational Group Note  Date:  10/28/2013 Time: 2000  Group Topic/Focus:  Wrap-Up Group:   The focus of this group is to help patients review their daily goal of treatment and discuss progress on daily workbooks.  Participation Level:  Active  Participation Quality:  Appropriate and Attentive  Affect:  Flat  Cognitive:  Alert, Appropriate and Oriented  Insight: Appropriate  Engagement in Group:  Engaged  Modes of Intervention:  Discussion, Education and Support  Additional Comments:  Pt shared that her therapeutic leisure activities were spending time with family, playing sports, listening to music and dancing.   Jacob Moores Monique 10/28/2013, 9:50 PM

## 2013-10-29 MED ORDER — TRAZODONE HCL 100 MG PO TABS
100.0000 mg | ORAL_TABLET | Freq: Every day | ORAL | Status: DC
  Administered 2013-10-29: 100 mg via ORAL
  Filled 2013-10-29: qty 14
  Filled 2013-10-29: qty 1
  Filled 2013-10-29 (×2): qty 14
  Filled 2013-10-29: qty 1

## 2013-10-29 MED ORDER — TRAZODONE HCL 50 MG PO TABS
50.0000 mg | ORAL_TABLET | Freq: Every day | ORAL | Status: DC
  Filled 2013-10-29 (×2): qty 1

## 2013-10-29 MED ORDER — HYDROXYZINE HCL 50 MG PO TABS
50.0000 mg | ORAL_TABLET | Freq: Every day | ORAL | Status: DC
  Administered 2013-10-29: 50 mg via ORAL
  Filled 2013-10-29: qty 14
  Filled 2013-10-29: qty 1
  Filled 2013-10-29: qty 14
  Filled 2013-10-29: qty 1
  Filled 2013-10-29: qty 14

## 2013-10-29 MED ORDER — CLONAZEPAM 0.5 MG PO TABS
0.5000 mg | ORAL_TABLET | Freq: Two times a day (BID) | ORAL | Status: DC
  Administered 2013-10-29 – 2013-10-30 (×2): 0.5 mg via ORAL
  Filled 2013-10-29 (×3): qty 1

## 2013-10-29 NOTE — Progress Notes (Signed)
D: Patient denies SI/HI and auditory and visual hallucinations. The patient has a depressed mood and affect. The patient appeared tearful and sad during interaction with RN this morning. Patient stated that she "has issues" and that she is "trying to be a better person." The patient would not go into more detail with RN about this. The patient is attending groups and interacting appropriately within the milieu.  A: Patient given emotional support from RN. Patient encouraged to come to staff with concerns and/or questions. Patient's medication routine continued. Patient's orders and plan of care reviewed. Patient given PRN vistaril for anxiety this am.  R: Patient remains cooperative. Will continue to monitor patient q15 minutes for safety.

## 2013-10-29 NOTE — Progress Notes (Signed)
D: Patient denies SI/HI/AVH. Patient rates hopelessness as 5,  depression as 5, and anxiety as 6.  Patient affect is depressed. Mood is depressed.  Pt states, "Conversations that I've had with my son today have got me depressed.  I'm trying to understand what to do with him.  He doesn't want to go to counseling.  We've been through a lot together over the past 4 years."  Patient did attend evening group. Patient visible on the milieu. No distress noted. A: Support and encouragement offered. Scheduled medications given to pt. Q 15 min checks continued for patient safety. R: Patient receptive. Patient remains safe on the unit.

## 2013-10-29 NOTE — Progress Notes (Signed)
Patient in hallway, sobbing and crying loudly. Patient incoherent and only able to say "My past. Stuff with my son." before beginning to sob and cry again. RN gave emotional support to patient but was unable to give any PRN medication (none available to give at this time). Patient encouraged to work on coping skills and strategies. RN called MD office for potential orders but received no response. Will continue to monitor patient for safety.

## 2013-10-29 NOTE — Progress Notes (Signed)
(  Discharging tomorrow) [facility] Adult Case Management Discharge Plan :  Will you be returning to the same living situation after discharge: Yes,  Pt will reutrn home At discharge, do you have transportation home?:Yes,  mom will pick pt up Do you have the ability to pay for your medications:Yes,  provided pt with samples and presciptions. Pt referred to [facility] to assist with affording medication  Release of information consent forms completed and in the chart;  Patient's signature needed at discharge.  Patient to Follow up at: Follow-up Information   Follow up with [facility] On 11/01/2013. (Walk in for hospital discharge appointment. Walk in clinic is Monday - Friday 8 am - 3 pm. They will than schedule your appts for medication management and therapy. )    Contact information:   [address], [place] [address] Phone: [phone] Fax: [phone]      Follow up with [facility]  On 11/02/2013. (Appointment scheduled at 9:00 am on this date with Verlin Grills for therapy.  )    Contact information:   [address] [place] [address] Phone: [phone] Fax: [phone]      Patient denies SI/HI:   Yes,  denies Si/HI    Safety Planning and Suicide Prevention discussed:  Yes,  discussed with pt and pt's mother.  See suicide prevention education note  [place], Diego Cory 10/29/2013, 2:50 PM

## 2013-10-29 NOTE — BHH Group Notes (Signed)
[facility] LCSW Aftercare Discharge Planning Group Note   10/29/2013  8:45 AM  Participation Quality:  Did Not Attend - pt meeting with the MD  Regan Lemming, LCSW 10/29/2013 10:17 AM

## 2013-10-29 NOTE — Progress Notes (Signed)
Patient ID: Mary Benson, female   DOB: 12/05/1970, 43 y.o.   MRN: [id number] Patient ID: Mary Benson, female   DOB: 11-Apr-1971, 43 y.o.   MRN: [id number] Patient ID: Mary Benson, female   DOB: [birth year], 43 y.o.   MRN: [id number] [facility] MD Progress Note  10/29/2013 11:27 AM Mary Benson  MRN:  [id number]  Subjective:  Patient was admitted with major depressive disorder, suicidal and homicidal ideations and anxiety.   Patient stated that she has a mild irritability and agitation and trying to get coping skills to manage and states group therapies are helping her.patient rates depression at  4/10 and anxiety at 5/10. Patient denies current suicidal ideation homicidal ideation and has not evidence of psychosis. Patient contracts for safety while in the hospital. Patient has better sleep and appetite.patient responded to trazodone better than Remeron (d/c'd).  patient complains of trouble concentrating.    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  HENT: Positive for congestion.   Respiratory: Positive for wheezing.   Gastrointestinal: Positive for abdominal pain.  Musculoskeletal: Positive for back pain.  Skin: Negative.   Neurological: Positive for tremors and weakness.  Psychiatric/Behavioral: Positive for depression. The patient has insomnia.     Blood pressure 123/81, pulse 111, temperature 97.4 F (36.3 C), temperature source Oral, resp. rate 16, height 5\' 5"  (1.651  m), weight 69.4 kg (153 lb), SpO2 97.00%.Body mass index is 25.46 kg/(m^2).  General Appearance: Casual,  patient has normal muscular stated activity   Eye Contact::  Fair  Speech:  Clear and Coherent and Slow, language is intact and normal    Volume:  Normal  Mood:  Euthymic  Affect:  Appropriate  Thought Process:  Goal Directed and Intact  Orientation:  Full (Time, Place, and Person)  Thought Content:  WDL  Suicidal Thoughts:  No  Homicidal Thoughts:  No  Memory:  Immediate;   Fair  Judgement:  Fair  Insight:  Lacking  Psychomotor Activity:  Psychomotor Retardation  Concentration:  Mary Benson has good fund of knowledge.  Recall:  Fair  Akathisia:  NA  Handed:  Right  AIMS (if indicated):     Assets:  Communication Skills Desire for Improvement Financial Resources/Insurance Housing Leisure Time Physical Health Resilience Social Support Transportation  Sleep:  Number of Hours: 6.75   Current Medications: Current Facility-Administered Medications  Medication Dose Route Frequency Provider Last Rate Last Dose  . acetaminophen (TYLENOL) tablet 650 mg  650 mg Oral Q6H PRN Waldon Merl, MD   650 mg at 10/27/13 0819  . alum & mag hydroxide-simeth (MAALOX/MYLANTA) 200-200-20 MG/5ML suspension 30 mL  30 mL Oral Q4H PRN Waldon Merl, MD      . escitalopram (LEXAPRO) tablet 20 mg  20 mg Oral Daily Durward Parcel, MD   20 mg at 10/29/13 0753  . fluticasone (FLONASE) 50 MCG/ACT nasal spray 2 spray  2 spray Each Nare Daily Nanci Pina, FNP   2 spray at 10/29/13 0754  . hydrOXYzine (ATARAX/VISTARIL) tablet 25 mg  25 mg Oral  TID PRN Waldon Merl, MD   25 mg at 10/29/13 0755  . ibuprofen (ADVIL,MOTRIN) tablet 600 mg  600 mg Oral Q6H PRN Laverle Hobby, PA-C   600 mg at 10/25/13 2201  . ibuprofen (ADVIL,MOTRIN) tablet 800 mg  800 mg Oral Q8H Benjamine Mola, FNP   800 mg at 10/29/13 2353  . loratadine (CLARITIN) tablet 10 mg  10 mg Oral Daily Nanci Pina, FNP   10  mg at 10/29/13 0753  . magnesium hydroxide (MILK OF MAGNESIA) suspension 30 mL  30 mL Oral Daily PRN Waldon Merl, MD      . nicotine (NICODERM CQ - dosed in mg/24 hours) patch 21 mg  21 mg Transdermal Q0600 Durward Parcel, MD   21 mg at 10/29/13 6144  . pseudoephedrine (SUDAFED) tablet 30 mg  30 mg Oral Q8H PRN Durward Parcel, MD      . traZODone (DESYREL) tablet 50 mg  50 mg Oral QHS Durward Parcel, MD        Lab Results: No results found for this or any previous visit (from the past 48 hour(s)).  Physical Findings: AIMS:  , ,  ,  ,    CIWA:    COWS:     Treatment Plan Summary: Daily contact with patient to assess and evaluate symptoms and progress in treatment Medication management for depression, anxiety, insomnia, poor appetite and nasal congestion  Plan: Review of chart, vital signs, medications, and notes.  1-Individual and group therapy  2-Medication management for depression and anxiety: Medications reviewed with the patient and she stated no untoward effects.  Continue Lexapro 20 mg Po Qd for depression Start clonazepam 0.5 mg twice daily for anxiety Increase Trazodone 100 mg PO at bedtime for insomnia Change Vistaril 50 mg by mouth at bedtime for insomnia  Continue ibuprofen 800mg  PO tid for right hand pain secondary to IV placement (no signs of infx).  3-Coping skills for depression, anxiety  4-Continue crisis stabilization and management  5-Address health issues--monitoring vital signs, stable  6-Treatment plan in progress to prevent relapse of depression and anxiety 7. Disposition plans in progress and maybe discharged Saturday if she continue to contract for safety and continued to show clinical improvement.    Medical Decision Making Problem Points:  Established problem, stable/improving (1), New problem, with no additional work-up planned (3), Review of last therapy session (1) and Review of psycho-social stressors (1) Data  Points:  Review or order clinical lab tests (1) Review or order medicine tests (1) Review of medication regiment & side effects (2) Review of new medications or change in dosage (2)  I certify that inpatient services furnished can reasonably be expected to improve the patient's condition.   Florene Brill,JANARDHAHA R. 10/29/2013, 11:27 AM

## 2013-10-29 NOTE — Progress Notes (Signed)
Recreation Therapy Notes  Date: 01.30.2015 Time: 2:45pm Location: [address] Dayroom   Group Topic: Communication, Team Building, Problem Solving  Goal Area(s) Addresses:  Patient will effectively work with peer towards shared goal.  Patient will identify skill used to make activity successful.  Patient will identify how skills used during activity can be used to reach post d/c goals.   Behavioral Response: Did not attend. Patient presented with flat, blunted affect and anxious behavior - shaking, staring blankly in front of her and bouncing her leg up and down. LRT asked patient if she was ok, patient responded that she would like to go lay down. LRT honored patient request.   Lane Hacker, LRT/CTRS  Lane Hacker 10/29/2013 4:48 PM

## 2013-10-29 NOTE — Tx Team (Signed)
Interdisciplinary Treatment Plan Update (Adult)  Date: 10/29/2013  Time Reviewed:  9:45 AM  Progress in Treatment: Attending groups: Yes Participating in groups:  Yes Taking medication as prescribed:  Yes Tolerating medication:  Yes Family/Significant othe contact made: Yes, with pt's mother Patient understands diagnosis:  Yes Discussing patient identified problems/goals with staff:  Yes Medical problems stabilized or resolved:  Yes Denies suicidal/homicidal ideation: Yes Issues/concerns per patient self-inventory:  Yes Other:  New problem(s) identified: N/A  Discharge Plan or Barriers: Pt has follow up scheduled at [facility] for medication management and therapy.    Reason for Continuation of Hospitalization: Anxiety Depression Medication Stabilization  Comments: N/A  Estimated length of stay: 1 day, d/c tomorrow  For review of initial/current patient goals, please see plan of care.  Attendees: Patient:     Family:     Physician:  Dr. Zorita Pang 10/29/2013 10:29 AM   Nursing:   Desma Paganini, RN 10/29/2013 10:29 AM   Clinical Social Worker:  Regan Lemming, LCSW 10/29/2013 10:29 AM   Other: Waylan Boga, NP 10/29/2013 10:29 AM   Other:  Gala Romney, care coordination 10/29/2013 10:29 AM   Other:  Joette Catching, LCSW 10/29/2013 10:29 AM   Other:  Clinton Sawyer, RN 10/29/2013 10:30 AM   Other:  Eduard Roux, RN 10/29/2013 10:30 AM   Other:    Other:    Other:    Other:    Other:     Scribe for Treatment Team:   Ane Payment, 10/29/2013 10:29 AM
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
## 2013-10-29 NOTE — Progress Notes (Signed)
Adult Psychoeducational Group Note  Date:  10/29/2013 Time:  9:05 PM  Group Topic/Focus:  Goals Group:   The focus of this group is to help patients establish daily goals to achieve during treatment and discuss how the patient can incorporate goal setting into their daily lives to aide in recovery. Wrap-Up Group:   The focus of this group is to help patients review their daily goal of treatment and discuss progress on daily workbooks.  Participation Level:  Active  Participation Quality:  Appropriate  Affect:  Appropriate  Cognitive:  Appropriate  Insight: Appropriate  Engagement in Group:  Engaged  Modes of Intervention:  Discussion  Additional Comments: The patient expressed that she learned in group to understand her issues.  Nash Shearer 10/29/2013, 9:05 PM

## 2013-10-30 MED ORDER — TRAZODONE HCL 100 MG PO TABS: 100.0000 mg | ORAL_TABLET | Freq: Every day | ORAL | Status: DC

## 2013-10-30 MED ORDER — ESCITALOPRAM OXALATE 20 MG PO TABS: 20.0000 mg | ORAL_TABLET | Freq: Every day | ORAL | Status: DC

## 2013-10-30 MED ORDER — ESCITALOPRAM OXALATE 20 MG PO TABS
20.0000 mg | ORAL_TABLET | Freq: Every day | ORAL | Status: DC
Start: 2013-10-30 — End: 2013-10-30

## 2013-10-30 MED ORDER — HYDROXYZINE HCL 50 MG PO TABS: 50.0000 mg | ORAL_TABLET | Freq: Every day | ORAL | Status: DC

## 2013-10-30 MED ORDER — CLONAZEPAM 0.5 MG PO TABS: 0.5000 mg | ORAL_TABLET | Freq: Two times a day (BID) | ORAL | Status: DC
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
## 2013-10-30 NOTE — Progress Notes (Signed)
Patient ID: Mary Benson, female   DOB: [birth year], 43 y.o.   MRN: [id number] Patient presents with anxious and depressed mood, affect congruent. She reports in am '' I'm hanging in there, I'm frustrated really because the doctor told me I would've gone home yesterday and I didn't and there have just been other things to upset me, but I came in here for help and now I'm ready to go. '' Patient denies any SI /HI / A/V Hallucinations. A. Medications given as ordered. Support and encouragement provided.  Orders received for pt discharge.Discharge instructions reviewed at length, copy of AVS provided and patient verbalized understanding of follow up plan. Crisis services reviewed. Rx given in addition to 14 day free supply of medications. All belongings returned. Pt denies SI/HI and no signs of acute decompensation. Pt escorted from unit to lobby to the care of Mother.

## 2013-10-30 NOTE — [facility] Suicide Risk Assessment (Signed)
Suicide Risk Assessment  Discharge Assessment     Demographic Factors:  Single. Lives with Mom  Total Time spent with patient: 30 minutes  Psychiatric Specialty Exam:     Blood pressure 111/66, pulse 114, temperature 97.7 F (36.5 C), temperature source Oral, resp. rate 18, height 5\' 5"  (1.651 m), weight 69.4 kg (153 lb), SpO2 97.00%.Body mass index is 25.46 kg/(m^2).  General Appearance: Casual  Eye Contact::  Fair  Speech:  Slow  Volume:  Decreased  Mood:  Euthymic  Affect:  Congruent  Thought Process:  Linear  Orientation:  Full (Time, Place, and Person)  Thought Content:  Not psychotic and linear.  Suicidal Thoughts:  No  Homicidal Thoughts:  No  Memory:  Recent;   Good  Judgement:  Intact  Insight:  Present  Psychomotor Activity:  Decreased  Concentration:  Fair  Recall:  [place] of Knowledge:Good  Language: adequate  Akathisia:  No  Handed:  Right  AIMS (if indicated):     Assets:  Desire for Improvement Social Support  Sleep:  Number of Hours: 6.75    Musculoskeletal: Strength & Muscle Tone: within normal limits Gait & Station: normal Patient leans: N/A   Mental Status Per Nursing Assessment::   On Admission:     Current Mental Status by Physician: See above. Loss Factors: Financial problems/change in socioeconomic status  Historical Factors: Impulsivity  Risk Reduction Factors:   Sense of responsibility to family, Positive therapeutic relationship and Positive coping skills or problem solving skills  Continued Clinical Symptoms:  Dysthymia  Cognitive Features That Contribute To Risk:  Closed-mindedness    Suicide Risk:  Minimal: No identifiable suicidal ideation.  Patients presenting with no risk factors but with morbid ruminations; may be classified as minimal risk based on the severity of the depressive symptoms  Discharge Diagnoses:   AXIS I:  Major depressive disorder, recurrent. PTSD by history AXIS II:  Deferred AXIS III:    Past Medical History  Diagnosis Date  . 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 Seasonal allergies   . History of cervical cancer   . Distal radius fracture, right 02/22/2013  . Laceration of forehead 02/22/2013    sutures in place  . Anxiety    AXIS IV:  economic problems and other psychosocial or environmental problems AXIS V:  61-70 mild symptoms  Plan Of Care/Follow-up recommendations:  Activity:  as tolerated Diet:  regular Follow up with [facility] on Monday. See Discharge summary for details. Continue prescribed medications at discharge. Currently not suicidal and will live with Mom.   Is patient on multiple antipsychotic therapies at discharge:  No   Has Patient had three or more failed trials of antipsychotic monotherapy by history:  No  Recommended Plan for Multiple Antipsychotic Therapies: NA    Jelissa Espiritu MD 10/30/2013, 10:00 AM

## 2013-10-30 NOTE — B[facility] Group Notes (Signed)
[facility] Group Notes:  (Nursing/MHT/Case Management/Adjunct)  Date:  10/30/2013  Time:  10:35 AM  Type of Therapy:  Psychoeducational Skills  Participation Level:  Active  Participation Quality:  Appropriate  Affect:  Appropriate  Cognitive:  Appropriate  Insight:  Good  Engagement in Group:  Improving  Modes of Intervention:  Discussion, Education and Exploration  Summary of Progress/Problems: Focus of this group was to identify unhealthy coping skills, triggers and to replace with healthy coping skills for mental health recovery.    Leonia Reader 10/30/2013, 10:35 AM

## 2013-10-30 NOTE — Progress Notes (Deleted)
Adult Psychoeducational Group Note  Date:  10/30/2013 Time:  3:58 PM  Group Topic/Focus:  Emotional Education:   The focus of this group is to discuss what feelings/emotions are, and how they are experienced.  Participation Level:  Did Not Attend  Mary Benson 10/30/2013, 3:58 PM

## 2013-10-30 NOTE — Progress Notes (Signed)
Writer spoke with patient 1:1 and she was upset and angry because she reported that she was told by her doctor that she would be discharging today and did not. She voiced complaints and writer encouraged her to fill out her survey before discharge and write these things down. Writer encouraged her to focus on herself and asked if she felt any changes in her overall feelings and emotions since being admitted. Patient was tearful and sad and reports that she feels a little bit better since being admitted. She reports that the groups with Vikki Ports the social worker have been helpful and some of the things discussed hit home. Writer encouraged her to use the time here to her advantage and not be so focused on discharge. Patient was receptive. She currently denies si/hi/a/v hallucinations. Safety maintained with 15 min checks.

## 2013-10-30 NOTE — BHH Group Notes (Signed)
[place] Group Notes: (Clinical Social Work)   10/30/2013      Type of Therapy:  Group Therapy   Participation Level:  Did Not Attend    Selmer Dominion, LCSW 10/30/2013, 4:42 PM
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
## 2013-10-30 NOTE — Discharge Instructions (Signed)
Major Depressive Disorder °Major depressive disorder (MDD) is a mental illness. It also may be called clinical depression or unipolar depression. MDD usually causes feelings of sadness, hopelessness, or helplessness. Some people with MDD do not feel particularly sad but lose interest in doing things they used to enjoy (anhedonia). MDD also can cause physical symptoms. It can interfere with work, school, relationships, and other normal everyday activities. MDD varies in severity but is longer lasting and more serious than the sadness we all feel from time to time in our lives. °MDD often is triggered by stressful life events or major life changes. Examples of these triggers include divorce, loss of your job or home, a move, and the death of a family member or close friend. Sometimes MDD occurs for no obvious reason at all. People who have family members with MDD or bipolar disorder are at higher risk for developing MDD, with or without life stressors. MDD can occur at any age. It may occur just once in your life (single episode MDD). It may occur multiple times (recurrent MDD). °SYMPTOMS °People with MDD have either anhedonia or depressed mood on nearly a daily basis for at least 2 weeks or longer. Symptoms of depressed mood include: °· Feelings of sadness (blue or down in the dumps) or emptiness. °· Feelings of hopelessness or helplessness. °· Tearfulness or episodes of crying (may be observed by others). °· Irritability (children and adolescents). °In addition to depressed mood or anhedonia or both, people with MDD have at least four of the following symptoms: °· Difficulty sleeping or sleeping too much.   °· Significant change (increase or decrease) in appetite or weight.   °· Lack of energy or motivation. °· Feelings of guilt and worthlessness.   °· Difficulty concentrating, remembering, or making decisions. °· Unusually slow movement (psychomotor retardation) or restlessness (as observed by others).    °· Recurrent wishes for death, recurrent thoughts of self-harm (suicide), or a suicide attempt. °People with MDD commonly have persistent negative thoughts about themselves, other people, and the world. People with severe MDD may experience distorted beliefs or perceptions about the world (psychotic delusions). They also may see or hear things that are not real (psychotic hallucinations). °DIAGNOSIS °MDD is diagnosed through an assessment by your caregiver. Your caregiver will ask about aspects of your daily life, such as mood, sleep, and appetite, to see if you have the diagnostic symptoms of MDD. Your caregiver may ask about your medical history and use of alcohol or drugs, including prescription medications. Your caregiver also may do a physical exam and blood work. This is because certain medical conditions and the use of certain substances can cause MDD-like symptoms (secondary depression). Your caregiver also may refer you to a mental health specialist for further evaluation and treatment. °TREATMENT °It is important to recognize the symptoms of MDD and seek treatment. The following treatments can be prescribed for MDD:   °· Medication Antidepressant medications usually are prescribed. Antidepressant medications are thought to correct chemical imbalances in the brain that are commonly associated with MDD. Other types of medication may be added if MDD symptoms do not respond to antidepressant medications alone or if psychotic delusions or hallucinations occur. °· Talk therapy Talk therapy can be helpful in treating MDD by providing support, education, and guidance. Certain types of talk therapy also can help with negative thinking (cognitive behavioral therapy) and with relationship issues that trigger MDD (interpersonal therapy). °A mental health specialist can help determine which treatment is best for you. Most people with MDD do well with a   combination of medication and talk therapy. Treatments involving  electrical stimulation of the brain can be used in situations with extremely severe symptoms or when medication and talk therapy do not work over time. These treatments include electroconvulsive therapy, transcranial magnetic stimulation, and vagal nerve stimulation. °Document Released: 01/11/2013 Document Reviewed: 01/11/2013 °Exit[employer]® Patient Information ©2014 [employer]. ° °

## 2013-10-30 NOTE — BHH Group Notes (Signed)
[place] Group Notes:  (Nursing/MHT/Case Management/Adjunct)  Date:  10/30/2013  Time:  10:54 AM  Type of Therapy:  Nurse Education  Participation Level:  Active  Participation Quality:  Appropriate  Affect:  Appropriate  Cognitive:  Alert  Insight:  Appropriate  Engagement in Group:  Engaged  Modes of Intervention:  Education  Summary of Progress/Problems:  Mary Benson 10/30/2013, 10:54 AM

## 2013-10-31 NOTE — BHH Group Notes (Signed)
[facility] Group Notes: (Clinical Social Work)   10/31/2013      Type of Therapy:  Group Therapy   Participation Level:  Did Not Attend    Selmer Dominion, LCSW 10/31/2013, 4:36 PM

## 2013-11-04 NOTE — Progress Notes (Signed)
Patient Discharge Instructions:  After Visit Summary (AVS):   Faxed to:  11/04/13 Discharge Summary Note:   Faxed to:  11/04/13 Psychiatric Admission Assessment Note:   Faxed to:  11/04/13 Suicide Risk Assessment - Discharge Assessment:   Faxed to:  11/04/13 Faxed/Sent to the Next Level Care provider:  11/04/13 Faxed to [facility] @ [phone] Faxed to [employer] @ [place], 11/04/2013, 3:07 PM

## 2014-03-17 IMAGING — CT CT CERVICAL SPINE W/O CM
4 of 6 series · 13 of 33 positions shown, 15 images · non-contrast
Comparison: Head CT 07/02/2009

CT HEAD

CLINICAL DATA: Trauma, head laceration.

CT HEAD WITHOUT CONTRAST
CT CERVICAL SPINE WITHOUT CONTRAST
TECHNIQUE: Multidetector CT imaging of the head and cervical spine
was performed following the standard protocol without intravenous
contrast.  Multiplanar CT image reconstructions of the cervical
spine were also generated.

[Series 6: c_spine 2.0 b31s detail · axial · 0.31mm/px · z∈[-252,-142]mm · 3 of 111 slices shown, 4 images]
[im 28/111  soft-tissue]
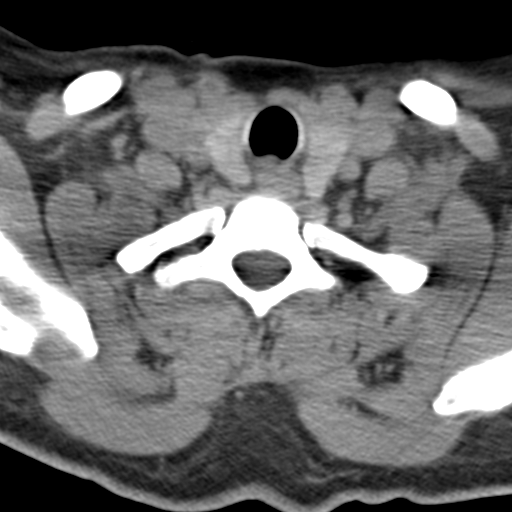
[im 28/111  bone]
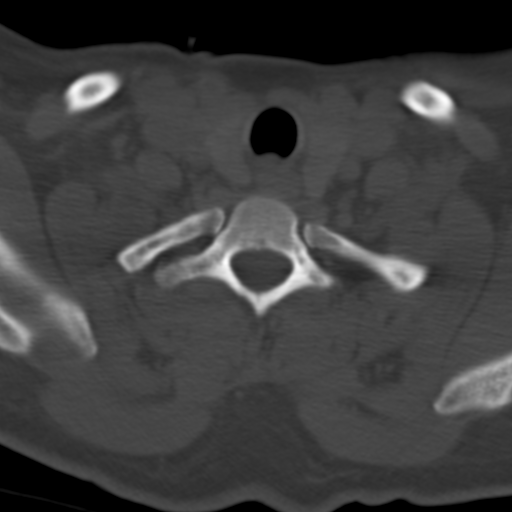
[im 56/111  bone]
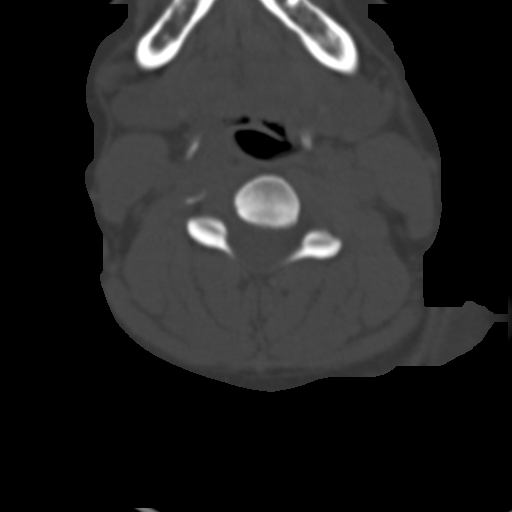
[im 83/111  bone]
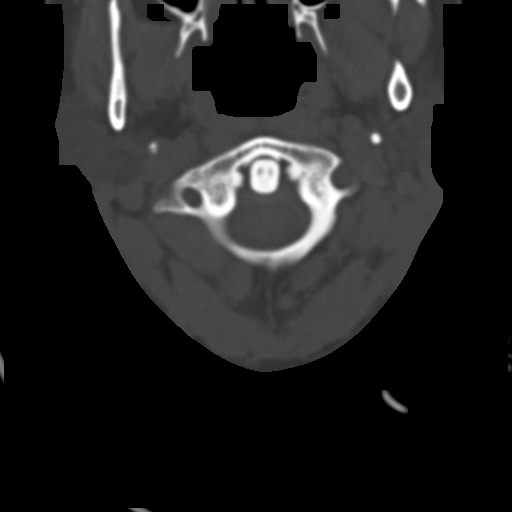

[Series 602: orthogonal · axial · 0.27mm/px · z∈[-245,-191]mm · 2 of 85 slices shown]
[im 29/85  bone]
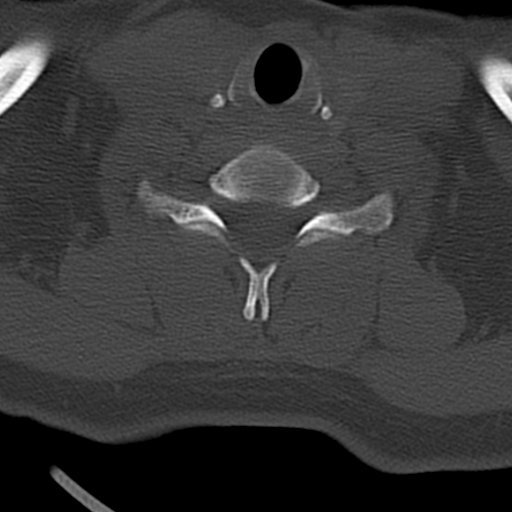
[im 57/85  bone]
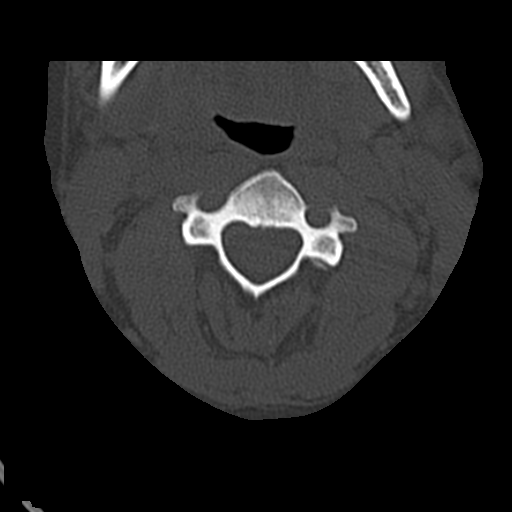

[Series 603: coronal · coronal · 0.41mm/px · 3 of 40 slices shown]
[im 8/40  bone]
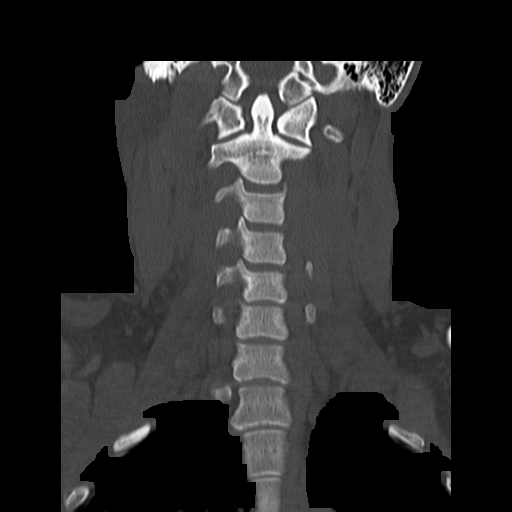
[im 16/40  bone]
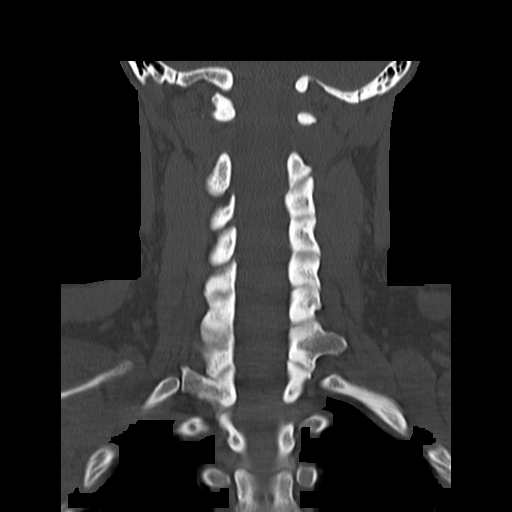
[im 24/40  bone]
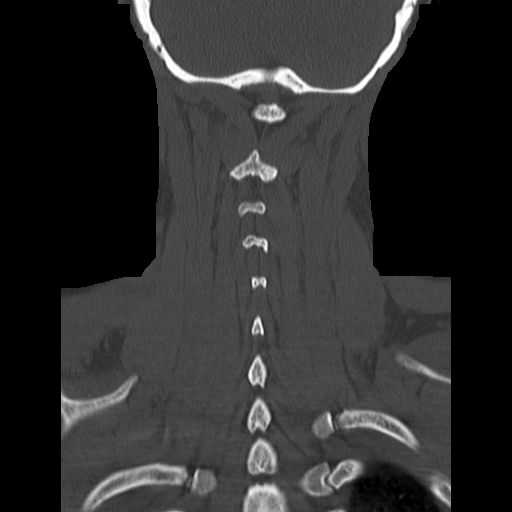

[Series 604: sagittal · sagittal · 0.43mm/px · 5 of 36 slices shown, 6 images]
[im 12/36  bone]
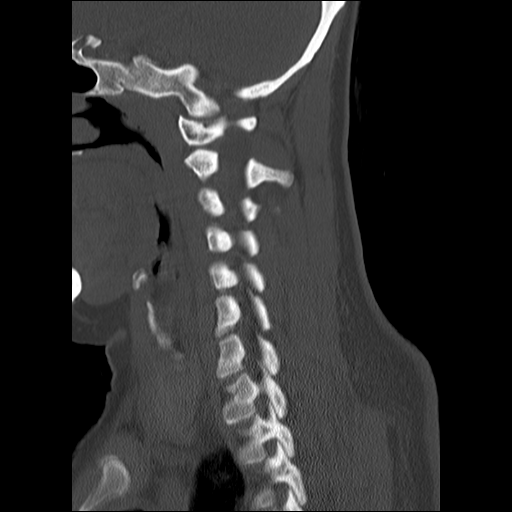
[im 15/36  bone]
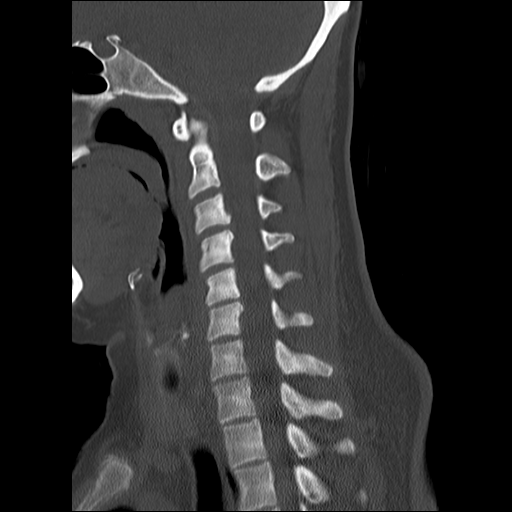
[im 18/36  soft-tissue]
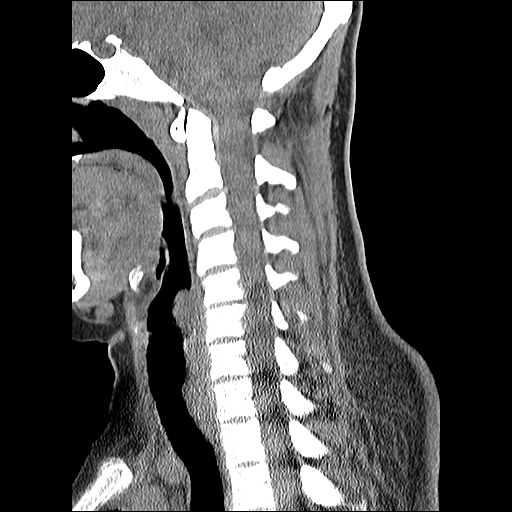
[im 18/36  bone]
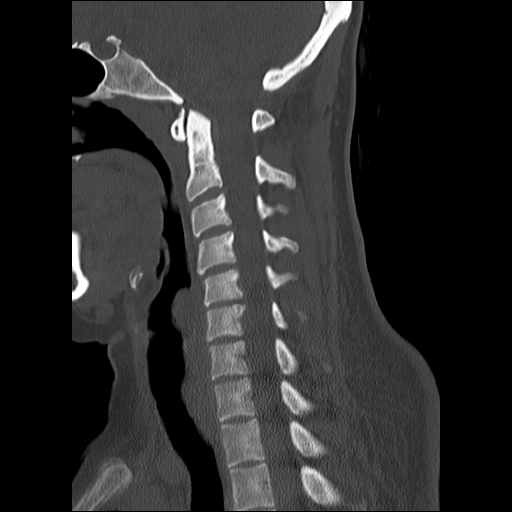
[im 21/36  bone]
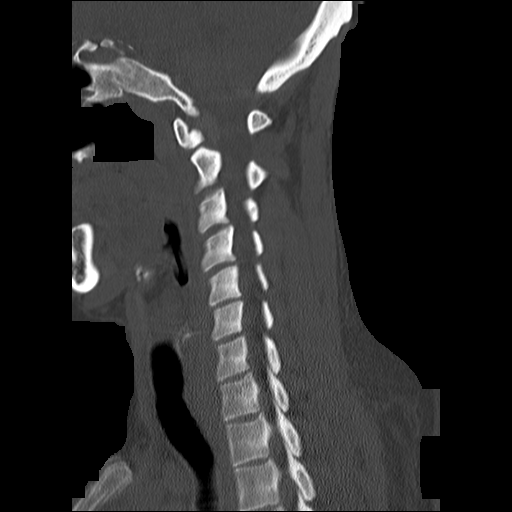
[im 24/36  bone]
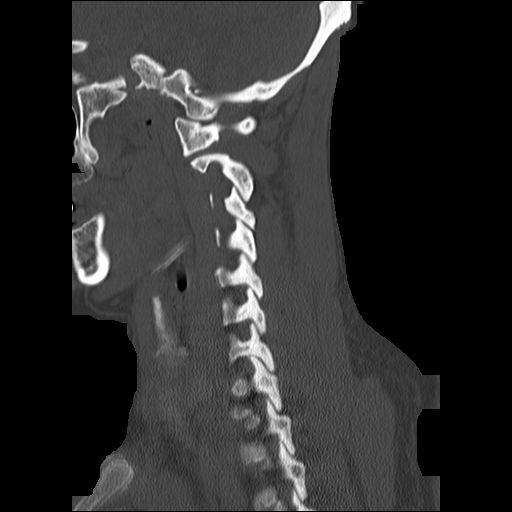

[13 of 33 positions shown; findings below may reference images not displayed]

FINDINGS: No intracranial hemorrhage.  No parenchymal contusion.
No midline shift or mass effect.  Basilar cisterns are patent. No
skull base fracture.  No fluid in the paranasal sinuses or mastoid
air cells.

There is a scalp hematoma over the right frontal bone with midline
laceration.  No associated skull fracture.  Orbits appear normal.
IMPRESSION: 1..  Small scalp hematoma over the right frontal bone with midline
laceration.

2.  No skull fracture.
3.  No intracranial trauma.

CT CERVICAL SPINE
FINDINGS: No prevertebral soft tissue swelling.  Normal alignment
of cervical vertebral bodies.  No loss of vertebral body height.
Normal facet articulation.  Normal craniocervical junction.

No evidence epidural or paraspinal hematoma.
IMPRESSION: No cervical spine fracture

## 2014-03-19 IMAGING — RF DG FOREARM 2V*R*
1 series · 3 of 3 positions shown · non-contrast
Comparison: Plain films right forearm 02/22/2013.

CLINICAL DATA: Fracture fixation.

DG C-ARM 1-60 MIN, RIGHT FOREARM - 2 VIEW
TECHNIQUE: Three fluoroscopic spot views of the right wrist are
provided.

[Series 1: run · 3 of 3 slices shown]
[im 1/3]
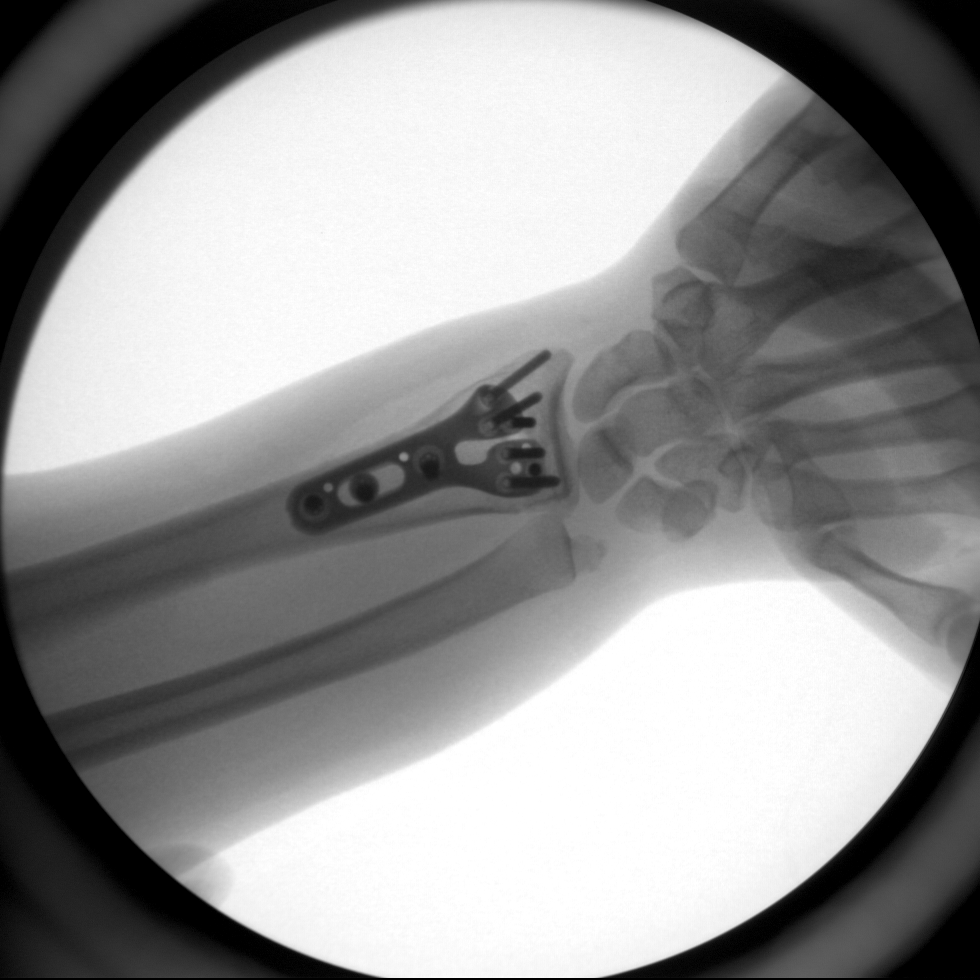
[im 2/3]
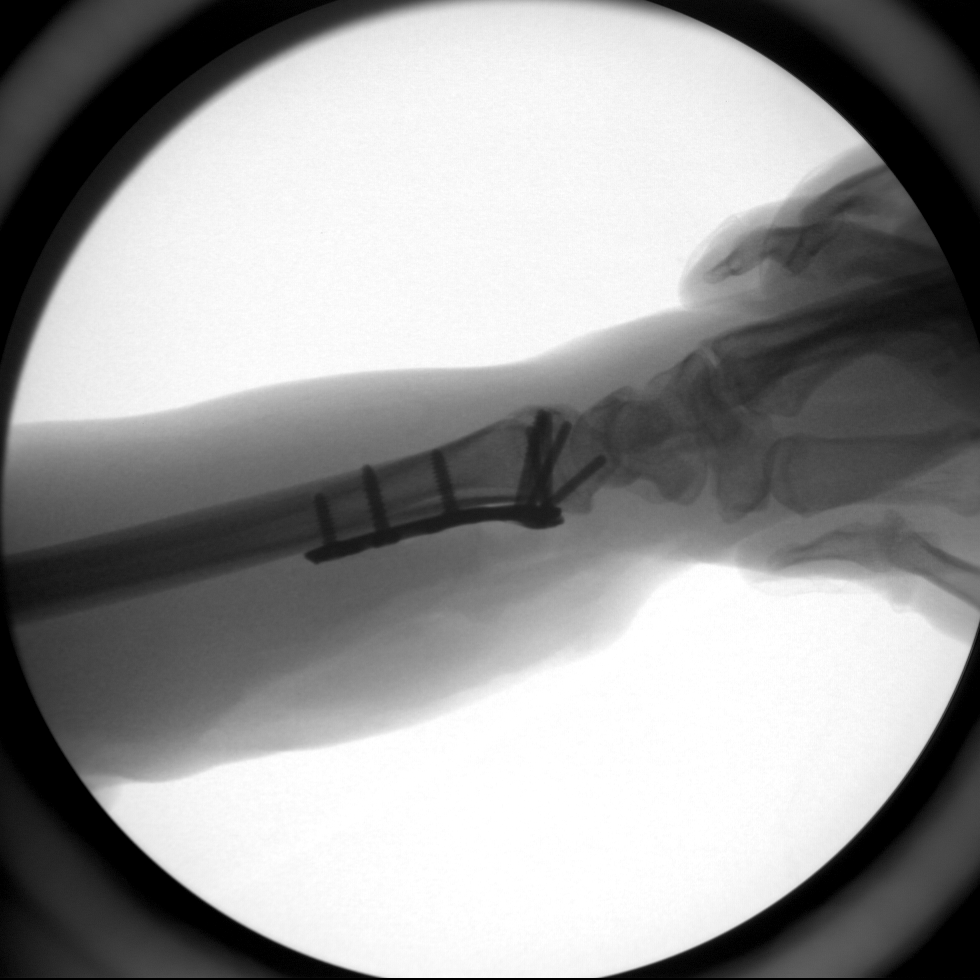
[im 3/3]
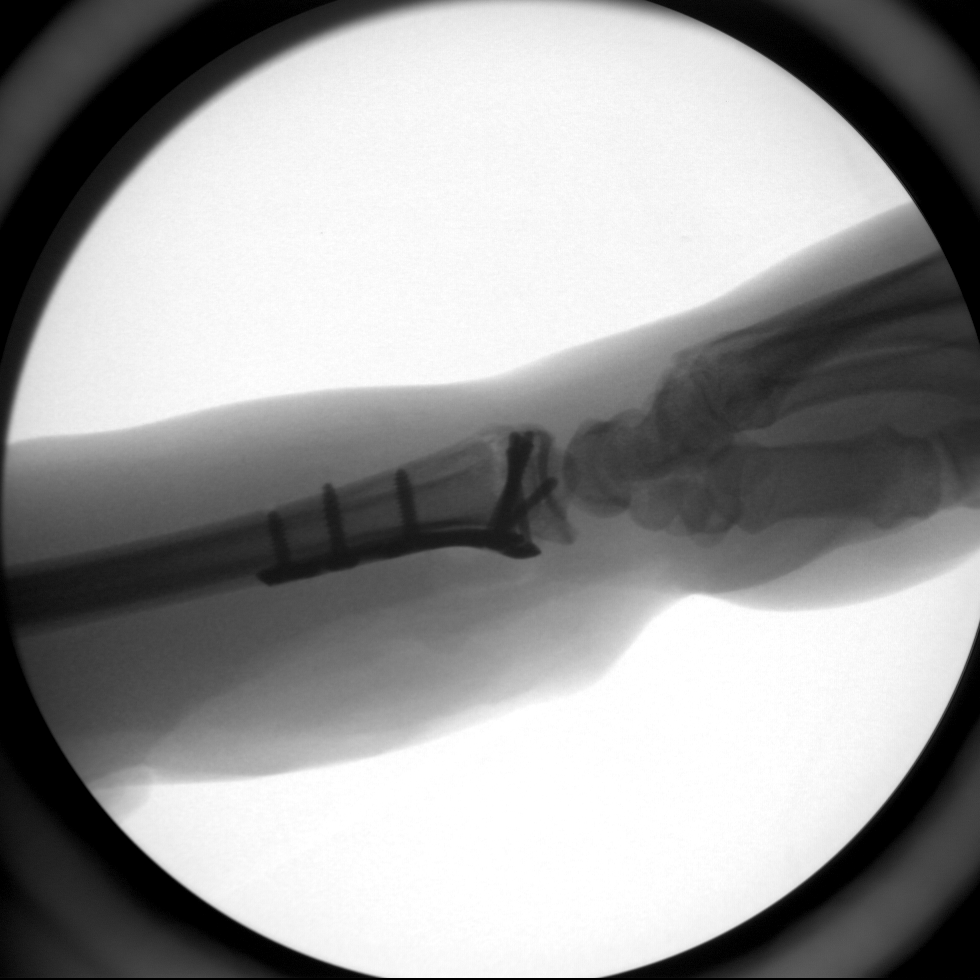

[3 of 3 positions shown; findings below may reference images not displayed]

FINDINGS: Provided images demonstrate placement of volar plate and
screws for fixation of a distal radius fracture.  Position and
alignment appear near anatomic.  No acute abnormality is
identified.
IMPRESSION: ORIF distal radius fracture.

## 2014-09-30 HISTORY — PX: KNEE ARTHROSCOPY: SUR90

## 2015-03-05 ENCOUNTER — Encounter (HOSPITAL_COMMUNITY): Payer: Self-pay | Admitting: *Deleted

## 2015-03-05 ENCOUNTER — Emergency Department (HOSPITAL_COMMUNITY)
Admission: EM | Admit: 2015-03-05 | Discharge: 2015-03-05 | Disposition: A | Discharge status: Home / Self Care | Payer: BLUE CROSS/BLUE SHIELD | Attending: Emergency Medicine | Admitting: Emergency Medicine

## 2015-03-05 ENCOUNTER — Emergency Department (HOSPITAL_COMMUNITY): Payer: BLUE CROSS/BLUE SHIELD

## 2015-03-05 DIAGNOSIS — Z9889 Other specified postprocedural states: Secondary | ICD-10-CM | POA: Diagnosis not present

## 2015-03-05 DIAGNOSIS — Z720 Tobacco use: Secondary | ICD-10-CM | POA: Diagnosis not present

## 2015-03-05 DIAGNOSIS — Z79899 Other long term (current) drug therapy: Secondary | ICD-10-CM | POA: Insufficient documentation

## 2015-03-05 DIAGNOSIS — Z8781 Personal history of (healed) traumatic fracture: Secondary | ICD-10-CM | POA: Diagnosis not present

## 2015-03-05 DIAGNOSIS — Z880 Allergy status to penicillin: Secondary | ICD-10-CM | POA: Insufficient documentation

## 2015-03-05 DIAGNOSIS — F419 Anxiety disorder, unspecified: Secondary | ICD-10-CM | POA: Insufficient documentation

## 2015-03-05 DIAGNOSIS — Z8541 Personal history of malignant neoplasm of cervix uteri: Secondary | ICD-10-CM | POA: Diagnosis not present

## 2015-03-05 DIAGNOSIS — S8991XD Unspecified injury of right lower leg, subsequent encounter: Secondary | ICD-10-CM | POA: Diagnosis not present

## 2015-03-05 DIAGNOSIS — W500XXD Accidental hit or strike by another person, subsequent encounter: Secondary | ICD-10-CM | POA: Diagnosis not present

## 2015-03-05 DIAGNOSIS — M25561 Pain in right knee: Secondary | ICD-10-CM | POA: Diagnosis present

## 2015-03-05 MED ORDER — IBUPROFEN 400 MG PO TABS
800.0000 mg | ORAL_TABLET | Freq: Once | ORAL | Status: AC
  Administered 2015-03-05: 800 mg via ORAL
  Filled 2015-03-05: qty 2

## 2015-03-05 MED ORDER — IBUPROFEN 800 MG PO TABS: 800.0000 mg | ORAL_TABLET | Freq: Three times a day (TID) | ORAL | Status: DC

## 2015-03-05 NOTE — ED Notes (Signed)
Pt states she had surgery to R knee in Jan.  States yesterday she was playing around and someone fell on top of her.  Has been experiencing pain since then, esp when she bears weight.

## 2015-03-05 NOTE — ED Provider Notes (Signed)
CSN: [id number]     Arrival date & time 03/05/15  1835 History  This chart was scribed for non-physician practitioner, Dahlia Bailiff, PA-C,working with Daleen Bo, MD, by Marlowe Kays, ED Scribe. This patient was seen in room TR10C/TR10C and the patient's care was started at 8:14 PM.  Chief Complaint  Patient presents with  . Knee Pain   Patient is a 44 y.o. female presenting with knee pain. The history is provided by the patient and medical records. No language interpreter was used.  Knee Pain   HPI Comments:  Mary Benson is a 44 y.o. female who presents to the Emergency Department complaining of severe right knee pain that began yesterday. She states she had surgery on the knee five months ago. She states she was outside playing with her family when she fell and her teenage son fell on top of her. She states she fell about two weeks ago with her right leg bent underneath her. She states the pain began immediately and has continued since. She reports bearing weight, flexion and extension makes the pain worse. Denies any alleviating factors. Denies numbness, tingling or weakness of the RLE, redness, bruising or wounds. Her orthopedist is Dr. Noemi Chapel.   Past Medical History  Diagnosis Date  . 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 Seasonal allergies   . History of cervical cancer   . Distal radius fracture, right 02/22/2013  . Laceration of forehead 02/22/2013    sutures in place  . Anxiety    Past Surgical History  Procedure Laterality Date  . Cesarean section  1997  . Abdominal hysterectomy      partial  . Knee arthroscopy Right   . Cholecystectomy    . Laparoscopic lysis of adhesions  01/31/2006  . Open reduction internal fixation (orif) distal radial fracture Right 02/24/2013    Procedure: OPEN REDUCTION INTERNAL FIXATION (ORIF) RIGHT DISTAL RADIUS FRACTURE;  Surgeon: Jolyn Nap, MD;  Location: [facility];  Service:  Orthopedics;  Laterality: Right;   No family history on file. History  Substance Use Topics  . Smoking status: Current Every Day Smoker -- 1.00 packs/day for 24 years    Types: Cigarettes  . Smokeless tobacco: Never Used  . Alcohol Use: Yes     Comment: occasionally   OB History    No data available     Review of Systems  Musculoskeletal: Positive for arthralgias.  Skin: Negative for color change and wound.  Neurological: Negative for weakness and numbness.    Allergies  Hydrocodone and Penicillins  Home Medications   Prior to Admission medications   Medication Sig Start Date End Date Taking? Authorizing Provider  Aspirin-Salicylamide-Caffeine (BC HEADACHE POWDER PO) Take 1 each by mouth every 6 (six) hours. pain    Historical Provider, MD  clonazePAM (KLONOPIN) 0.5 MG tablet Take 1 tablet (0.5 mg total) by mouth 2 (two) times daily. 10/30/13   Knox Royalty, NP  escitalopram (LEXAPRO) 20 MG tablet Take 1 tablet (20 mg total) by mouth daily. 10/30/13   Patrecia Pour, NP  hydrOXYzine (ATARAX/VISTARIL) 50 MG tablet Take 1 tablet (50 mg total) by mouth at bedtime. 10/30/13   Patrecia Pour, NP  ibuprofen (ADVIL,MOTRIN) 800 MG tablet Take 1 tablet (800 mg total) by mouth 3 (three) times daily. 03/05/15   Dahlia Bailiff, PA-C  traZODone (DESYREL) 100 MG tablet Take 1 tablet (100 mg total) by mouth at  bedtime. 10/30/13   Patrecia Pour, NP   Triage Vitals: BP 120/91 mmHg  Pulse 92  Temp(Src) 98.1 F (36.7 C) (Oral)  Resp 16  Ht 5' (1.524 m)  Wt 150 lb (68.04 kg)  BMI 29.30 kg/m2  SpO2 98% Physical Exam  Constitutional: She is oriented to person, place, and time. She appears well-developed and well-nourished.  HENT:  Head: Normocephalic and atraumatic.  Eyes: EOM are normal.  Neck: Normal range of motion.  Cardiovascular: Normal rate.   Pulses:      Dorsalis pedis pulses are 2+ on the right side.  Pulmonary/Chest: Effort normal.  Musculoskeletal: Normal range of motion.  No  obvious joint effusion. No erythema, ecchymosis, deformity. ROM limited due to pain. No anterior/posterior, lateral/medial joint instability. Diffused joint line tenderness.  Neurological: She is alert and oriented to person, place, and time.  Distal sensations intact. 5/5 motor strength at hip, knee, ankle.  Skin: Skin is warm and dry.  Psychiatric: She has a normal mood and affect. Her behavior is normal.  Nursing note and vitals reviewed.   ED Course  Procedures (including critical care time) DIAGNOSTIC STUDIES: Oxygen Saturation is 98% on RA, normal by my interpretation.   COORDINATION OF CARE: 8:25 PM- Offered pain medication but pt declined. Will prescribe Motrin and advised pt to follow up with her orthopedist. Will order knee immobilizer. Offered crutches but pt states she already has some at home. Will provide work note. Pt verbalizes understanding and agrees to plan.  Medications - No data to display  Labs Review Labs Reviewed - No data to display  Imaging Review Dg Knee Complete 4 Views Right  03/05/2015   CLINICAL DATA:  [place] yesterday twisting knee. Pain and swelling. History of prior ACL reconstruction.  EXAM: RIGHT KNEE - COMPLETE 4+ VIEW  COMPARISON:  MRI 08/05/2014  FINDINGS: There are surgical changes from an ACL reconstruction. No acute bony findings are demonstrated. No osteochondral abnormality. Small amount of fluid in the suprapatellar bursa but no overt effusion.  IMPRESSION: No acute bony findings.   Electronically Signed   By: Marijo Sanes M.D.   On: 03/05/2015 19:37     EKG Interpretation None      MDM   Final diagnoses:  Knee injury, right, subsequent encounter    Patient with right knee injury.radiographs unremarkable for acute pathology. Patientwith previous anterior cruciate ligament reconstruction, there is concern for reinjury of patient's surgical reconstruction versus a internal derangement injury. Patient is neurovascularly intact.patient be  placed in knee immobilizer, given Motrin for pain, encouraged RICE therapy. On exam there is no frank instability. Patient be discharged to follow up with Dr. Noemi Chapel as outpatient, patient states this is who performed her anterior cruciate ligament reconstruction in the past. Return precautions discussed, patient verbalizes understanding and agreement of this plan.  I personally performed the services described in this documentation, which was scribed in my presence. The recorded information has been reviewed and is accurate.  BP 120/91 mmHg  Pulse 92  Temp(Src) 98.1 F (36.7 C) (Oral)  Resp 16  Ht 5' (1.524 m)  Wt 150 lb (68.04 kg)  BMI 29.30 kg/m2  SpO2 98%  Signed,  Dahlia Bailiff, PA-C 8:44 PM   Dahlia Bailiff, PA-C 03/05/15 2044  Daleen Bo, MD 03/05/15 [id number]

## 2015-03-05 NOTE — Discharge Instructions (Signed)

## 2015-04-24 ENCOUNTER — Emergency Department (HOSPITAL_COMMUNITY): Payer: Self-pay

## 2015-04-24 ENCOUNTER — Emergency Department (HOSPITAL_COMMUNITY): Payer: BLUE CROSS/BLUE SHIELD

## 2015-04-24 ENCOUNTER — Encounter (HOSPITAL_COMMUNITY): Payer: Self-pay | Admitting: Family Medicine

## 2015-04-24 ENCOUNTER — Emergency Department (HOSPITAL_COMMUNITY)
Admission: EM | Admit: 2015-04-24 | Discharge: 2015-04-24 | Disposition: A | Discharge status: Home / Self Care | Payer: Self-pay | Attending: Emergency Medicine | Admitting: Emergency Medicine

## 2015-04-24 DIAGNOSIS — H538 Other visual disturbances: Secondary | ICD-10-CM | POA: Insufficient documentation

## 2015-04-24 DIAGNOSIS — Z79899 Other long term (current) drug therapy: Secondary | ICD-10-CM | POA: Insufficient documentation

## 2015-04-24 DIAGNOSIS — R202 Paresthesia of skin: Secondary | ICD-10-CM | POA: Insufficient documentation

## 2015-04-24 DIAGNOSIS — Z3202 Encounter for pregnancy test, result negative: Secondary | ICD-10-CM | POA: Insufficient documentation

## 2015-04-24 DIAGNOSIS — Z7982 Long term (current) use of aspirin: Secondary | ICD-10-CM | POA: Insufficient documentation

## 2015-04-24 DIAGNOSIS — R531 Weakness: Secondary | ICD-10-CM | POA: Insufficient documentation

## 2015-04-24 DIAGNOSIS — I771 Stricture of artery: Secondary | ICD-10-CM

## 2015-04-24 DIAGNOSIS — R110 Nausea: Secondary | ICD-10-CM | POA: Insufficient documentation

## 2015-04-24 DIAGNOSIS — R200 Anesthesia of skin: Secondary | ICD-10-CM | POA: Insufficient documentation

## 2015-04-24 DIAGNOSIS — Z8781 Personal history of (healed) traumatic fracture: Secondary | ICD-10-CM | POA: Insufficient documentation

## 2015-04-24 DIAGNOSIS — F419 Anxiety disorder, unspecified: Secondary | ICD-10-CM | POA: Insufficient documentation

## 2015-04-24 DIAGNOSIS — Z880 Allergy status to penicillin: Secondary | ICD-10-CM | POA: Insufficient documentation

## 2015-04-24 DIAGNOSIS — Z720 Tobacco use: Secondary | ICD-10-CM | POA: Insufficient documentation

## 2015-04-24 DIAGNOSIS — K551 Chronic vascular disorders of intestine: Secondary | ICD-10-CM | POA: Insufficient documentation

## 2015-04-24 DIAGNOSIS — G40909 Epilepsy, unspecified, not intractable, without status epilepticus: Secondary | ICD-10-CM | POA: Insufficient documentation

## 2015-04-24 DIAGNOSIS — Z8541 Personal history of malignant neoplasm of cervix uteri: Secondary | ICD-10-CM | POA: Insufficient documentation

## 2015-04-24 HISTORY — DX: Malignant (primary) neoplasm, unspecified: C80.1

## 2015-04-24 LAB — CBC WITH DIFFERENTIAL/PLATELET
Basophils Absolute: 0 10*3/uL (ref 0.0–0.1)
Basophils Relative: 0 % (ref 0–1)
EOS ABS: 0.1 10*3/uL (ref 0.0–0.7)
Eosinophils Relative: 1 % (ref 0–5)
HCT: 41.3 % (ref 36.0–46.0)
HEMOGLOBIN: 14.4 g/dL (ref 12.0–15.0)
LYMPHS ABS: 3.7 10*3/uL (ref 0.7–4.0)
LYMPHS PCT: 31 % (ref 12–46)
MCH: 30.2 pg (ref 26.0–34.0)
MCHC: 34.9 g/dL (ref 30.0–36.0)
MCV: 86.6 fL (ref 78.0–100.0)
MONO ABS: 0.7 10*3/uL (ref 0.1–1.0)
MONOS PCT: 6 % (ref 3–12)
NEUTROS ABS: 7.4 10*3/uL (ref 1.7–7.7)
Neutrophils Relative %: 62 % (ref 43–77)
Platelets: 327 10*3/uL (ref 150–400)
RBC: 4.77 MIL/uL (ref 3.87–5.11)
RDW: 13.2 % (ref 11.5–15.5)
WBC: 11.9 10*3/uL — AB (ref 4.0–10.5)

## 2015-04-24 LAB — URINALYSIS, ROUTINE W REFLEX MICROSCOPIC
Bilirubin Urine: NEGATIVE
GLUCOSE, UA: NEGATIVE mg/dL
HGB URINE DIPSTICK: NEGATIVE
KETONES UR: 15 mg/dL — AB
NITRITE: NEGATIVE
PH: 5.5 (ref 5.0–8.0)
Protein, ur: NEGATIVE mg/dL
SPECIFIC GRAVITY, URINE: 1.028 (ref 1.005–1.030)
Urobilinogen, UA: 0.2 mg/dL (ref 0.0–1.0)

## 2015-04-24 LAB — LIPASE, BLOOD: Lipase: 26 U/L (ref 22–51)

## 2015-04-24 LAB — POC URINE PREG, ED: Preg Test, Ur: NEGATIVE

## 2015-04-24 LAB — COMPREHENSIVE METABOLIC PANEL
ALK PHOS: 72 U/L (ref 38–126)
ALT: 17 U/L (ref 14–54)
AST: 25 U/L (ref 15–41)
Albumin: 3.9 g/dL (ref 3.5–5.0)
Anion gap: 9 (ref 5–15)
BILIRUBIN TOTAL: 0.5 mg/dL (ref 0.3–1.2)
BUN: 12 mg/dL (ref 6–20)
CO2: 25 mmol/L (ref 22–32)
Calcium: 9.4 mg/dL (ref 8.9–10.3)
Chloride: 105 mmol/L (ref 101–111)
Creatinine, Ser: 1.07 mg/dL — ABNORMAL HIGH (ref 0.44–1.00)
GFR calc non Af Amer: >60 mL/min (ref >60)
GLUCOSE: 84 mg/dL (ref 65–99)
POTASSIUM: 3.7 mmol/L (ref 3.5–5.1)
SODIUM: 139 mmol/L (ref 135–145)
TOTAL PROTEIN: 7 g/dL (ref 6.5–8.1)

## 2015-04-24 LAB — URINE MICROSCOPIC-ADD ON

## 2015-04-24 MED ORDER — ONDANSETRON 4 MG PO TBDP: 4.0000 mg | ORAL_TABLET | Freq: Three times a day (TID) | ORAL | Status: DC | PRN

## 2015-04-24 MED ORDER — ONDANSETRON HCL 4 MG/2ML IJ SOLN
4.0000 mg | Freq: Once | INTRAMUSCULAR | Status: AC
  Administered 2015-04-24: 4 mg via INTRAVENOUS
  Filled 2015-04-24: qty 2

## 2015-04-24 MED ORDER — IOHEXOL 350 MG/ML SOLN
80.0000 mL | Freq: Once | INTRAVENOUS | Status: AC | PRN
  Administered 2015-04-24: 80 mL via INTRAVENOUS

## 2015-04-24 MED ORDER — IOHEXOL 300 MG/ML  SOLN
25.0000 mL | Freq: Once | INTRAMUSCULAR | Status: AC | PRN
  Administered 2015-04-24: 25 mL via ORAL

## 2015-04-24 MED ORDER — SODIUM CHLORIDE 0.9 % IV BOLUS (SEPSIS)
1000.0000 mL | Freq: Once | INTRAVENOUS | Status: AC
  Administered 2015-04-24: 1000 mL via INTRAVENOUS

## 2015-04-24 MED ORDER — IOHEXOL 300 MG/ML  SOLN
100.0000 mL | Freq: Once | INTRAMUSCULAR | Status: AC | PRN
  Administered 2015-04-24: 100 mL via INTRAVENOUS

## 2015-04-24 MED ORDER — TRAMADOL HCL 50 MG PO TABS: 50.0000 mg | ORAL_TABLET | Freq: Four times a day (QID) | ORAL | Status: DC | PRN

## 2015-04-24 MED ORDER — SODIUM CHLORIDE 0.9 % IV BOLUS (SEPSIS)
500.0000 mL | Freq: Once | INTRAVENOUS | Status: AC
  Administered 2015-04-24: 500 mL via INTRAVENOUS

## 2015-04-24 NOTE — ED Notes (Signed)
Pt sts she has had 4 episodes of right face pulling, right face and arm tingling and numbness. sts she became diaphoretic and nauseous. sts cold sweats. sts she has had 4 of these episodes lasting about 15 minutes a piece. sts currently nausea and tired. sts right hand is asleep. sts she feels like she is in a barrel. sts currently right side numbness.

## 2015-04-24 NOTE — ED Notes (Signed)
This RN unable to gain IV access x 2 times. Katie, RN to attempt.

## 2015-04-24 NOTE — ED Provider Notes (Signed)
CSN: [id number]     Arrival date & time 04/24/15  1604 History   First MD Initiated Contact with Patient 04/24/15 1634     Chief Complaint  Patient presents with  . Abdominal Pain  . Tingling  . Nausea    HPI  Patient presents with concern of episodic nausea, dizziness, diaphoresis. Over the past 24 hours patient has had 4 episodes. H episode is non-prodromal, brief, resolved with rest. During episodes symptoms are profound, with concurrent right facial discomfort. Currently the patient is none of these concerns, but does complain of fatigue. She also complains of new abdominal pain, worsening over the past 3 weeks. The pain is sore, severe, sharp, in the right lower quadrant. Pain is nonradiating, with mild associated dysuria, no hematuria, no bowel movement changes. No new medication, diet.   Past Medical History  Diagnosis Date  . 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 Seasonal allergies   . History of cervical cancer   . Distal radius fracture, right 02/22/2013  . Laceration of forehead 02/22/2013    sutures in place  . Anxiety    Past Surgical History  Procedure Laterality Date  . Cesarean section  1997  . Abdominal hysterectomy      partial  . Knee arthroscopy Right   . Cholecystectomy    . Laparoscopic lysis of adhesions  01/31/2006  . Open reduction internal fixation (orif) distal radial fracture Right 02/24/2013    Procedure: OPEN REDUCTION INTERNAL FIXATION (ORIF) RIGHT DISTAL RADIUS FRACTURE;  Surgeon: Jolyn Nap, MD;  Location: [facility];  Service: Orthopedics;  Laterality: Right;   History reviewed. No pertinent family history. History  Substance Use Topics  . Smoking status: Current Every Day Smoker -- 1.00 packs/day for 24 years    Types: Cigarettes  . Smokeless tobacco: Never Used  . Alcohol Use: Yes     Comment: occasionally   OB History    No data available     Review of Systems  Constitutional:        Per HPI, otherwise negative  HENT:       Per HPI, otherwise negative  Eyes: Positive for visual disturbance.  Respiratory:       Per HPI, otherwise negative  Cardiovascular:       Per HPI, otherwise negative  Gastrointestinal: Positive for nausea. Negative for vomiting.  Endocrine:       Negative aside from HPI  Genitourinary:       Neg aside from HPI   Musculoskeletal:       Per HPI, otherwise negative  Skin: Negative.   Neurological: Positive for dizziness, weakness, light-headedness and numbness. Negative for syncope, facial asymmetry and headaches.  Psychiatric/Behavioral: Positive for dysphoric mood.      Allergies  Hydrocodone and Penicillins  Home Medications   Prior to Admission medications   Medication Sig Start Date End Date Taking? Authorizing Provider  Aspirin-Salicylamide-Caffeine (BC HEADACHE POWDER PO) Take 1 each by mouth every 6 (six) hours. pain    Historical Provider, MD  clonazePAM (KLONOPIN) 0.5 MG tablet Take 1 tablet (0.5 mg total) by mouth 2 (two) times daily. 10/30/13   Knox Royalty, NP  escitalopram (LEXAPRO) 20 MG tablet Take 1 tablet (20 mg total) by mouth daily. 10/30/13   Patrecia Pour, NP  hydrOXYzine (ATARAX/VISTARIL) 50 MG tablet Take 1 tablet (50 mg total) by mouth at bedtime. 10/30/13   Patrecia Pour,  NP  ibuprofen (ADVIL,MOTRIN) 800 MG tablet Take 1 tablet (800 mg total) by mouth 3 (three) times daily. 03/05/15   Dahlia Bailiff, PA-C  traZODone (DESYREL) 100 MG tablet Take 1 tablet (100 mg total) by mouth at bedtime. 10/30/13   Patrecia Pour, NP   BP 141/84 mmHg  Pulse 100  Temp(Src) 99.1 F (37.3 C) (Oral)  Resp 20  SpO2 98% Physical Exam  Constitutional: She is oriented to person, place, and time. She appears well-developed and well-nourished. No distress.  HENT:  Head: Normocephalic and atraumatic.  Eyes: Conjunctivae and EOM are normal.  Neck: No spinous process tenderness and no muscular tenderness present. No rigidity. No edema,  no erythema and normal range of motion present.  Negative Spurling test  Cardiovascular: Normal rate and regular rhythm.   Pulmonary/Chest: Effort normal and breath sounds normal. No stridor. No respiratory distress.  Abdominal: She exhibits no distension. There is tenderness in the right lower quadrant. There is guarding and tenderness at McBurney's point. There is no rigidity.  Musculoskeletal: She exhibits no edema.  Neurological: She is alert and oriented to person, place, and time. She displays no atrophy and no tremor. No cranial nerve deficit or sensory deficit. She exhibits normal muscle tone. She displays no seizure activity. Coordination normal.  Skin: Skin is warm and dry.  Psychiatric: She has a normal mood and affect.  Nursing note and vitals reviewed.   ED Course  Procedures (including critical care time) Labs Review Labs Reviewed  COMPREHENSIVE METABOLIC PANEL - Abnormal; Notable for the following:    Creatinine, Ser 1.07 (*)    All other components within normal limits  CBC WITH DIFFERENTIAL/PLATELET - Abnormal; Notable for the following:    WBC 11.9 (*)    All other components within normal limits  URINALYSIS, ROUTINE W REFLEX MICROSCOPIC (NOT AT [facility]) - Abnormal; Notable for the following:    Ketones, ur 15 (*)    Leukocytes, UA SMALL (*)    All other components within normal limits  URINE MICROSCOPIC-ADD ON - Abnormal; Notable for the following:    Squamous Epithelial / LPF FEW (*)    Crystals CA OXALATE CRYSTALS (*)    All other components within normal limits  LIPASE, BLOOD  ETHANOL  POC URINE PREG, ED    Imaging Review Ct Angio Abdomen W/cm &/or Wo Contrast  04/24/2015   CLINICAL DATA:  Abdominal pain  EXAM: CT ANGIOGRAPHY ABDOMEN  TECHNIQUE: Multidetector CT imaging of the abdomen was performed using the standard protocol during bolus administration of intravenous contrast. Multiplanar reconstructed images including MIPs were obtained and reviewed to  evaluate the vascular anatomy.  CONTRAST:  66mL OMNIPAQUE IOHEXOL 350 MG/ML SOLN  COMPARISON:  None.  FINDINGS: Aorta is non aneurysmal and patent. 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 Branch vessels are non aneurysmal and patent.  There is 60-70% narrowing of the SMA just beyond its origin secondary to atherosclerotic change. There is post stenotic dilatation. Branch vessels are grossly patent.  IMA is diminutive and patent.  Branch vessels are grossly patent.  Single renal arteries are patent bilaterally.  There are atherosclerotic changes of the common iliac artery without significant narrowing.  Review of the MIP images confirms the above findings.  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 This could certainly contribute to abdominal pain, particularly if pain is postprandial and associated with  weight loss.   Electronically Signed   By: Marybelle Killings M.D.   On: 04/24/2015 22:23   Ct Abdomen Pelvis W Contrast  04/24/2015   CLINICAL DATA:  Right lower quadrant pain  EXAM: CT ABDOMEN AND PELVIS WITH CONTRAST  TECHNIQUE: Multidetector CT imaging of the abdomen and pelvis was performed using the standard protocol following bolus administration of intravenous contrast.  CONTRAST:  136mL OMNIPAQUE IOHEXOL 300 MG/ML  SOLN  COMPARISON:  06/23/2007  FINDINGS: Postcholecystectomy  Mild diffuse hepatic steatosis  Spleen, adrenal glands, kidneys are within normal limits  The pancreatic head is within normal limits. The body and tail are atrophic.  Normal appendix. Bladder and adnexa are unremarkable. Uterus is absent  Significant narrowing of the SMA secondary to atherosclerotic plaque, just beyond the origin is suspected.  No definitive evidence of inflammatory change of the colon.  IMPRESSION: No acute intra-abdominal pathology.  No evidence of appendicitis  Significant  narrowing at the origin of the SMA is suspected. CT angiogram of the abdomen is recommended.   Electronically Signed   By: Marybelle Killings M.D.   On: 04/24/2015 19:28     EKG Interpretation   Date/Time:  Monday April 24 2015 17:17:35 EDT Ventricular Rate:  83 PR Interval:  123 QRS Duration: 80 QT Interval:  398 QTC Calculation: 468 R Axis:   62 Text Interpretation:  Sinus rhythm Abnormal R-wave progression, early  transition Baseline wander in lead(s) V5 Sinus rhythm Artifact Abnormal  ekg Confirmed by Carmin Muskrat  MD [phone]) on 04/24/2015 5:21:23 PM      Chart review demonstrates multiple visits recently, and psychiatric evaluation several months ago. Patient has 2 head CTs within the past 1 year.  8:14 PM Patient awake and alert, aware of all results.  With radiographic concern for SMA changes, CT angiography will be performed.   On repeat exam, after all imaging is back, patient is aware of all findings. Family members present, and we discussed all findings at length, also discussed the patient's prior provision of location, which she has not been taking. I advocated for the patient to have inpatient evaluation of her SMA stenosis. Patient declined this recommendation, requests discharge for outpatient treatment. I counseled her on risks, benefits, return precautions.  MDM   Final diagnoses:  SMA stenosis  Nausea   patient presents with ongoing abdominal pain, nausea, vomiting, episodic dizziness. Patient is awake, alert, oriented appropriately, in no distress, with no neurologic deficiencies. She does have a tender abdomen, and given her description of new symptomatic severity, CT scan was performed. Per radiologist recognition, CT angiography was also performed. 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 No evidence for bacteremia, sepsis, peritonitis, acute neurologic change. Patient requested  discharge, and will follow up with outpatient providers for further evaluation, management.  Carmin Muskrat, MD 04/24/15 2259

## 2015-04-24 NOTE — ED Notes (Signed)
CT notified that the pt has an appropriate IV for her scan.

## 2015-04-24 NOTE — ED Notes (Signed)
With permission from Vanita Panda, MD pt has taken her own Excedrin for her headache.

## 2015-04-24 NOTE — Discharge Instructions (Signed)
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 This is likely contributing to your symptoms, and requires additional evaluation, and management to occur.  Please return here for any concerning changes in your condition.

## 2015-04-24 NOTE — ED Notes (Signed)
Pt is in CT scan.

## 2015-04-24 NOTE — ED Notes (Signed)
CT called to ask if the pt could receive some extra fluid due to the amount of contrast she was given today.

## 2015-04-25 ENCOUNTER — Telehealth: Payer: Self-pay

## 2015-04-25 NOTE — Telephone Encounter (Signed)
Phone call from pt.  Reported she was seen in the ER yesterday, and was told she needed to be evaluated for mesenteric stenosis ASAP.  Reported for approx. 3 weeks, she has had intermittent abdominal pain, bloating, and nausea.  Denied vomiting.  Stated on Sunday, the symptoms got worse with "ongoing nausea, breaking out in cold sweat, and feeling like voices were coming from a tunnel."  Stated she doesn't think her symptoms are necessarily associated with eating, but stated if she eats a few bites, she gets very full, then can't eat anymore.  Stated her abdomen is distended like she is 3 mos. pregnant.  Reported having regular BM's and passing gas.  Also stated she is "more gassy than usual."  Denied weight loss.  Denied fever.   Discussed with Dr. Kellie Simmering. Recommended pt. to be evaluated by her PCP for underlying cause of symptoms, since nausea and bloating are not typical symptoms associated with Mesenteric Stenosis.  Appt. had previously been made for pt. on 05/10/15 with Dr. Oneida Alar.  Dr. Kellie Simmering agrees that it is okay to have pt. eval. for the mesenteric stenosis in 2 weeks. Pt. verb.understanding.  Agreed to contact her PCP.

## 2015-05-09 ENCOUNTER — Encounter: Payer: Self-pay | Admitting: Vascular Surgery

## 2015-05-10 ENCOUNTER — Encounter: Payer: Self-pay | Admitting: Vascular Surgery

## 2015-05-10 ENCOUNTER — Ambulatory Visit (INDEPENDENT_AMBULATORY_CARE_PROVIDER_SITE_OTHER): Payer: BLUE CROSS/BLUE SHIELD | Admitting: Vascular Surgery

## 2015-05-10 VITALS — BP 120/88 | HR 100 | Temp 97.4°F | Resp 16 | Ht 61.0 in | Wt 166.0 lb

## 2015-05-10 DIAGNOSIS — R109 Unspecified abdominal pain: Secondary | ICD-10-CM | POA: Diagnosis not present

## 2015-05-10 MED ORDER — ASPIRIN EC 81 MG PO TBEC: 81.0000 mg | DELAYED_RELEASE_TABLET | Freq: Every day | ORAL | Status: DC

## 2015-05-10 MED ORDER — PANTOPRAZOLE SODIUM 40 MG PO TBEC: 40.0000 mg | DELAYED_RELEASE_TABLET | Freq: Every day | ORAL | Status: DC

## 2015-05-10 NOTE — Progress Notes (Signed)
[facility] [place] HISTORY AND PHYSICAL   History of Present Illness:  Patient is a 44 y.o. year old female who presents for evaluation of chronic abdominal pain. The patient states she has a 4 week history of epigastric and lower abdominal pain that is continuous lasting all day long every day. She states she feels bloated all the time. She has had some nausea but denies emesis. She also describes a pulling sensation in her upper abdomen when having bowel movements. She denies constipation or diarrhea. She denies blood in her stool. She denies weight loss and states she has actually gained 11 pounds. She states she does have pain when eating bread or steak after 7-8 bites. She states that this pain improves after 15-20 minutes and she is able to complete a meal. She currently smokes 1 pack of cigarettes per day. Greater than 3 minutes today were spent regarding smoking cessation counseling. She denies history of diabetes hypertension or elevated cholesterol. She denies family history of early atherosclerotic disease. She does not really describe food fear. She does not really describe any improvement in symptoms with certain types of food. She does not really describe worsening symptoms with greasy or spicy foods. Other medical problems include history of cervical cancer, anxiety, depression.  She did have a hysterectomy removal of one ovary and removal of a portion of the other ovary for her cervical cancer. She has also had a cholecystectomy.  Past Medical History  Diagnosis Date  . 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 Seasonal allergies   . History of cervical cancer   . Distal radius fracture, right 02/22/2013  . Laceration of forehead 02/22/2013    sutures in place  . Anxiety   . Cancer     Past Surgical History  Procedure Laterality Date  . Cesarean section  1997  . Abdominal hysterectomy      partial  . Knee arthroscopy Right   .  Cholecystectomy    . Laparoscopic lysis of adhesions  01/31/2006  . Open reduction internal fixation (orif) distal radial fracture Right 02/24/2013    Procedure: OPEN REDUCTION INTERNAL FIXATION (ORIF) RIGHT DISTAL RADIUS FRACTURE;  Surgeon: Jolyn Nap, MD;  Location: [facility];  Service: Orthopedics;  Laterality: Right;    Social History Social History  Substance Use Topics  . Smoking status: Current Every Day Smoker -- 1.00 packs/day for 24 years    Types: Cigarettes  . Smokeless tobacco: Never Used  . Alcohol Use: 0.0 oz/week    0 Standard drinks or equivalent per week     Comment: occasionally    Family History Family History  Problem Relation Age of Onset  . Cancer Mother     Allergies  Allergies  Allergen Reactions  . Hydrocodone Other (See Comments)    ABD. PAIN  . Penicillins Hives     Current Outpatient Prescriptions  Medication Sig Dispense Refill  . aspirin-acetaminophen-caffeine (EXCEDRIN MIGRAINE) 250-250-65 MG per tablet Take 2 tablets by mouth every 6 (six) hours as needed for headache or migraine.    Marland Kitchen loratadine-pseudoephedrine (CLARITIN-D 12-HOUR) 5-120 MG per tablet Take 1 tablet by mouth 2 (two) times daily.    . Multiple Vitamins-Minerals (WOMENS MULTIVITAMIN PLUS PO) Take 1 tablet by mouth daily.    Marland Kitchen aspirin EC 81 MG tablet Take 1 tablet (81 mg total) by mouth daily. 150 tablet 2  . clonazePAM (KLONOPIN) 0.5 MG  tablet Take 1 tablet (0.5 mg total) by mouth 2 (two) times daily. (Patient not taking: Reported on 04/24/2015) 14 tablet 0  . escitalopram (LEXAPRO) 20 MG tablet Take 1 tablet (20 mg total) by mouth daily. (Patient not taking: Reported on 04/24/2015) 30 tablet 0  . hydrOXYzine (ATARAX/VISTARIL) 50 MG tablet Take 1 tablet (50 mg total) by mouth at bedtime. (Patient not taking: Reported on 04/24/2015) 30 tablet 0  . ondansetron (ZOFRAN ODT) 4 MG disintegrating tablet Take 1 tablet (4 mg total) by mouth every 8 (eight) hours as  needed for nausea or vomiting. (Patient not taking: Reported on 05/10/2015) 20 tablet 0  . pantoprazole (PROTONIX) 40 MG tablet Take 1 tablet (40 mg total) by mouth daily. 30 tablet 2  . traMADol (ULTRAM) 50 MG tablet Take 1 tablet (50 mg total) by mouth every 6 (six) hours as needed. (Patient not taking: Reported on 05/10/2015) 15 tablet 0  . traZODone (DESYREL) 100 MG tablet Take 1 tablet (100 mg total) by mouth at bedtime. (Patient not taking: Reported on 04/24/2015) 30 tablet 0   No current facility-administered medications for this visit.    ROS:   General:  No weight loss, Fever, chills  HEENT: No recent headaches, no nasal bleeding, no visual changes, no sore throat  Neurologic: No dizziness, blackouts, seizures. No recent symptoms of stroke or mini- stroke. No recent episodes of slurred speech, or temporary blindness.  Cardiac: No recent episodes of chest pain/pressure, no shortness of breath at rest.  No shortness of breath with exertion.  Denies history of atrial fibrillation or irregular heartbeat  Vascular: No history of rest pain in feet.  No history of claudication.  No history of non-healing ulcer, No history of DVT   Pulmonary: No home oxygen, no productive cough, no hemoptysis,  No asthma or wheezing  Musculoskeletal:  [ ]  Arthritis, [ ]  Low back pain,  [ ]  Joint pain  Hematologic:No history of hypercoagulable state.  No history of easy bleeding.  No history of anemia  Gastrointestinal: No hematochezia or melena,  No gastroesophageal reflux, no trouble swallowing  Urinary: [ ]  chronic Kidney disease, [ ]  on HD - [ ]  MWF or [ ]  TTHS, [ ]  Burning with urination, [ ]  Frequent urination, [ ]  Difficulty urinating;   Skin: No rashes  Psychological: No history of anxiety,  No history of depression   Physical Examination  Filed Vitals:   05/10/15 1017  BP: 120/88  Pulse: 100  Temp: 97.4 F (36.3 C)  Resp: 16  Height: 5\' 1"  (1.549 m)  Weight: 166 lb (75.297 kg)   SpO2: 100%    Body mass index is 31.38 kg/(m^2).  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 Celiac and inferior mesenteric arteries are patent.   ASSESSMENT:  Patient complains of chronic continuous abdominal pain. Her symptoms are not completely consistent with that usually seen in chronic mesenteric ischemia. Although she does have evidence of severe mesenteric artery stenosis, her celiac  and inferior mesenteric arteries are patent. It is unusual to see chronic mesenteric artery symptoms with only one vessel disease.   PLAN:  #1 the patient was placed on omeprazole today for possible peptic ulcer disease. I also started her on 81 mg aspirin daily since she does have evidence of atherosclerosis. #2 she was encouraged to quit smoking. #3 we will have her evaluated by gastroenterology for other possible etiologies of her abdominal pain. If a GI workup is completely negative we will we will revisit whether or not mesenteric ischemia may be the cause of her pain  Ruta Hinds, MD [facility] [place]: [phone] Pager: [phone]

## 2015-05-30 ENCOUNTER — Encounter: Payer: Self-pay | Admitting: Physician Assistant

## 2015-05-30 ENCOUNTER — Ambulatory Visit (INDEPENDENT_AMBULATORY_CARE_PROVIDER_SITE_OTHER): Payer: BLUE CROSS/BLUE SHIELD | Admitting: Physician Assistant

## 2015-05-30 VITALS — BP 122/90 | HR 94 | Temp 97.9°F | Ht 61.5 in | Wt 168.8 lb

## 2015-05-30 DIAGNOSIS — R1013 Epigastric pain: Secondary | ICD-10-CM

## 2015-05-30 DIAGNOSIS — R1310 Dysphagia, unspecified: Secondary | ICD-10-CM

## 2015-05-30 DIAGNOSIS — K219 Gastro-esophageal reflux disease without esophagitis: Secondary | ICD-10-CM

## 2015-05-30 MED ORDER — PANTOPRAZOLE SODIUM 40 MG PO TBEC: 40.0000 mg | DELAYED_RELEASE_TABLET | Freq: Every day | ORAL | Status: DC

## 2015-05-30 NOTE — Progress Notes (Signed)
Patient ID: Mary Benson, female   DOB: May 12, 1971, 44 y.o.   MRN: [id number]    HPI:  Mary Benson is a 44 y.o.   female  referred by Mary Chimes, NP for evaluation of abdominal pain. Mary Benson has been seen by Dr. Deatra Benson in the past, unfortunately these records are not available.  [name] presents today with a 2 to three-month history of epigastric and mid abdominal pain. The pain comes and goes and is typically worse after meals, and is associated with nausea but no vomiting. Mary Benson reports that she feels full after 2 or 3 bites of food. She feels as if her food is getting stuck in the bottom of her esophagus. She is able to push solid foods down with liquids but she has had several episodes where she has had to regurgitate her food because it would not go down. He has been getting frequent heartburn and has been getting regurgitation to the point that it comes out of her nose. She has been coughing a lot at night in getting a lot of nocturnal regurgitation. The morning she has been coughing up copious amounts of mucus. She had seen her primary care physician and was started on pantoprazole but she discontinued it because she says it caused her to have cold symptoms. She reports that she has a history of ulcers diagnosed 25-30 years ago with an EGD at [facility] gastroenterology 25-30 years ago. She denies use of non-steroidal anti-inflammatory medications. She admits to occasional use of alcohol. She smokes less than half a pack a day. 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 She was evaluated in the emergency room on 04/24/2015 for lower abdominal pain. She had an abdominal pelvic CT with no acute intra-abdominal pathology. No evidence of appendicitis. Significant narrowing at the origin of the SMA was suspected. She then had a CT angiogram of the abdomen that showed a 60-70% narrowing of the SMA just beyond its origin secondary to  atherosclerotic change there is post stenotic dilatation. Branch vessels are grossly patent. IMA is diminutive and patent. Single renal arteries are patent bilaterally. There are atherosclerotic changes of the common iliac artery without significant narrowing.   Past Medical History  Diagnosis Date  . 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 Seasonal allergies   . History of cervical cancer   . Distal radius fracture, right 02/22/2013  . Laceration of forehead 02/22/2013    sutures in place  . Anxiety   . Cancer     cervical, uterine,     Past Surgical History  Procedure Laterality Date  . Cesarean section  1997  . Abdominal hysterectomy      partial  . Knee arthroscopy Right 09/2014  . Cholecystectomy    . Laparoscopic lysis of adhesions  01/31/2006  . Open reduction internal fixation (orif) distal radial fracture Right 02/24/2013    Procedure: OPEN REDUCTION INTERNAL FIXATION (ORIF) RIGHT DISTAL RADIUS FRACTURE;  Surgeon: Jolyn Nap, MD;  Location: [facility];  Service: Orthopedics;  Laterality: Right;   Family History  Problem Relation Age of Onset  . Cervical cancer Mother    Social History  Substance Use Topics  . Smoking status: Current Every Day Smoker -- 1.00 packs/day for 24 years    Types: Cigarettes  . Smokeless tobacco: Never Used  . Alcohol Use: 0.0 oz/week    0 Standard drinks  or equivalent per week     Comment: occasionally   Current Outpatient Prescriptions  Medication Sig Dispense Refill  . aspirin EC 81 MG tablet Take 1 tablet (81 mg total) by mouth daily. 150 tablet 2  . aspirin-acetaminophen-caffeine (EXCEDRIN MIGRAINE) 628-315-17 MG per tablet Take 2 tablets by mouth every 6 (six) hours as needed for headache or migraine.    . Multiple Vitamins-Minerals (WOMENS MULTIVITAMIN PLUS PO) Take 1 tablet by mouth daily.    . pantoprazole (PROTONIX) 40 MG tablet Take 1 tablet (40 mg total) by mouth daily. 30 tablet  3   No current facility-administered medications for this visit.   Allergies  Allergen Reactions  . Hydrocodone Other (See Comments)    ABD. PAIN  . Penicillins Hives     Review of Systems: Gen: Denies any fever, chills, sweats, anorexia, fatigue, weakness, malaise, weight loss, and sleep disorder CV: Denies chest pain, angina, palpitations, syncope, orthopnea, PND, peripheral edema, and claudication. Resp: Denies dyspnea at rest, dyspnea with exercise, cough, sputum, wheezing, coughing up blood, and pleurisy. GI: Denies vomiting blood, jaundice, and fecal incontinence.   Reports dysphagia to solids and liquids GU : Denies urinary burning, blood in urine, urinary frequency, urinary hesitancy, nocturnal urination, and urinary incontinence. MS: Denies joint pain, limitation of movement, and swelling, stiffness, low back pain, extremity pain. Denies muscle weakness, cramps, atrophy.  Derm: Denies rash, itching, dry skin, hives, moles, warts, or unhealing ulcers.  Psych: Has a history of depression and anxiety. Heme: Denies bruising, bleeding, and enlarged lymph nodes. 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   Studies: Pelvis W Contrast   Status: Final result       PACS Images     Show images for CT Abdomen Pelvis W Contrast     Study Result     CLINICAL DATA: Right lower quadrant pain  EXAM: CT ABDOMEN AND PELVIS WITH CONTRAST  TECHNIQUE: Multidetector CT imaging of the abdomen and pelvis was performed using the standard protocol following bolus administration of intravenous contrast.  CONTRAST: 112mL OMNIPAQUE IOHEXOL 300 MG/ML SOLN  COMPARISON: 06/23/2007  FINDINGS: Postcholecystectomy  Mild diffuse hepatic steatosis  Spleen, adrenal glands, kidneys are within normal limits  The pancreatic head is within normal limits. The body and tail are atrophic.  Normal appendix. Bladder and adnexa are unremarkable. Uterus  is absent  Significant narrowing of the SMA secondary to atherosclerotic plaque, just beyond the origin is suspected.  No definitive evidence of inflammatory change of the colon.  IMPRESSION: No acute intra-abdominal pathology. No evidence of appendicitis  Significant narrowing at the origin of the SMA is suspected. CT angiogram of the abdomen is recommended.   Electronically Signed  By: Marybelle Killings M.D.  On: 04/24/2015 19:28   Abdomen W/Cm &/Or Wo Contrast   Status: Final result       PACS Images     Show images for CT Angio Abdomen W/Cm &/Or Wo Contrast     Study Result     CLINICAL DATA: Abdominal pain  EXAM: CT ANGIOGRAPHY ABDOMEN  TECHNIQUE: Multidetector CT imaging of the abdomen was performed using the standard protocol during bolus administration of intravenous contrast. Multiplanar reconstructed images including MIPs were obtained and reviewed to evaluate the vascular anatomy.  CONTRAST: 49mL OMNIPAQUE IOHEXOL 350 MG/ML SOLN  COMPARISON: None.  FINDINGS: Aorta is non aneurysmal and patent. 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 Branch vessels are non aneurysmal and patent.  There is 60-70% narrowing of the SMA just beyond its origin secondary to atherosclerotic change. There is post stenotic dilatation. Branch vessels are grossly patent.  IMA is diminutive and patent. Branch vessels are grossly patent.  Single renal arteries are patent bilaterally.  There are atherosclerotic changes of the common iliac artery without significant narrowing.  Review of the MIP images confirms the above findings.  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 This could certainly contribute to abdominal pain, particularly if pain is postprandial and associated with weight  loss.   Electronically Signed  By: Marybelle Killings M.D.  On: 04/24/2015 22:23     LAB RESULTS: Blood work on 04/24/2015 revealed a chem profile with sodium 139, potassium 3.7, chloride 105, CO2 25, BUN 12, creatinine 1.07. Alkaline phosphatase 72, lipase 26, AST 25, ALT 17, total bili 0.5. CBC white count 11.9, hemoglobin 14.4, hematocrit 41.3, platelets 327,000.   Physical Exam: BP 122/90 mmHg  Pulse 94  Temp(Src) 97.9 F (36.6 C)  Ht 5' 1.5" (1.562 m)  Wt 168 lb 12.8 oz (76.567 kg)  BMI 31.38 kg/m2 Constitutional: Pleasant,well-developed female in no acute distress. HEENT: Normocephalic and atraumatic. Conjunctivae are normal. No scleral icterus. Neck supple. No JVD Cardiovascular: Normal rate, regular rhythm.  Pulmonary/chest: Effort normal and breath sounds normal. No wheezing, rales or rhonchi. Abdominal: Soft, nondistended, nontender. Bowel sounds active throughout. There are no masses palpable. No hepatomegaly. Extremities: no edema Lymphadenopathy: No cervical adenopathy noted. Neurological: Alert and oriented to person place and time. Skin: Skin is warm and dry. No rashes noted. Psychiatric: Normal mood and affect. Behavior is normal.  ASSESSMENT AND PLAN: 44 year old female with a 2 to three-month history of epigastric pain associated with nausea referred for evaluation. Patient has been experiencing significant regurgitation along with 7-12 months of dysphagia to solids and liquids that has been progressing in severity. An anti-reflux regimen has been reviewed, and she will be given a trial of pantoprazole 40 mg 1 by mouth every morning 30 minutes prior to breakfast. Patient has been instructed to adhere to small-volume, low-fat meals. She will be scheduled for a barium swallow with tablet to evaluate for possible stricture, esophagitis, etc. She will then be scheduled for an EGD to assess for esophagitis, gastritis, ulcer, etc.The risks, benefits, and alternatives to  endoscopy with possible biopsy and possible dilation were discussed with the patient and they consent to proceed. The procedure will be scheduled with Dr. Deatra Benson. Further recommendations will be made pending the findings of the above tests.    Jonathon Castelo, Deloris Ping 05/30/2015, 10:31 PM  CC: Mary Chimes, NP

## 2015-05-30 NOTE — Patient Instructions (Addendum)
You have been scheduled for an endoscopy. Please follow written instructions given to you at your visit today. If you use inhalers (even only as needed), please bring them with you on the day of your procedure. Your physician has requested that you go to www.startemmi.com and enter the access code given to you at your visit today. This web site gives a general overview about your procedure. However, you should still follow specific instructions given to you by our office regarding your preparation for the procedure. You have been scheduled for a Barium Esophogram at [facility] on 06/01/15 at 11:10 am . Please arrive 15 minutes prior to your appointment for registration.  If you need to reschedule for any reason, please contact radiology at [phone] to do so. __________________________________________________________________ A barium swallow is an examination that concentrates on views of the esophagus. This tends to be a double contrast exam (barium and two liquids which, when combined, create a gas to distend the wall of the oesophagus) or single contrast (non-ionic iodine based). The study is usually tailored to your symptoms so a good history is essential. Attention is paid during the study to the form, structure and configuration of the esophagus, looking for functional disorders (such as aspiration, dysphagia, achalasia, motility and reflux) EXAMINATION You may be asked to change into a gown, depending on the type of swallow being performed. A radiologist and radiographer will perform the procedure. The radiologist will advise you of the type of contrast selected for your procedure and direct you during the exam. You will be asked to stand, sit or lie in several different positions and to hold a small amount of fluid in your mouth before being asked to swallow while the imaging is performed .In some instances you may be asked to swallow barium coated marshmallows to assess the  motility of a solid food bolus. The exam can be recorded as a digital or video fluoroscopy procedure. POST PROCEDURE It will take 1-2 days for the barium to pass through your system. To facilitate this, it is important, unless otherwise directed, to increase your fluids for the next 24-48hrs and to resume your normal diet.  This test typically takes about 30 minutes to perform. __________________________________________________________________________________ We have sent the following medications to your pharmacy for you to pick up at your convenience: We have given you anti reflux precautions to follow. Small volume, frequent low fat meals

## 2015-06-01 ENCOUNTER — Ambulatory Visit
Admission: RE | Admit: 2015-06-01 | Discharge: 2015-06-01 | Disposition: A | Discharge status: Home / Self Care | Payer: BLUE CROSS/BLUE SHIELD | Source: Ambulatory Visit | Attending: Physician Assistant | Admitting: Physician Assistant

## 2015-06-01 ENCOUNTER — Other Ambulatory Visit: Payer: Self-pay | Admitting: *Deleted

## 2015-06-01 DIAGNOSIS — K219 Gastro-esophageal reflux disease without esophagitis: Secondary | ICD-10-CM

## 2015-06-01 DIAGNOSIS — R1013 Epigastric pain: Secondary | ICD-10-CM

## 2015-06-01 DIAGNOSIS — E049 Nontoxic goiter, unspecified: Secondary | ICD-10-CM

## 2015-06-01 DIAGNOSIS — R1310 Dysphagia, unspecified: Secondary | ICD-10-CM

## 2015-06-06 ENCOUNTER — Telehealth: Payer: Self-pay | Admitting: *Deleted

## 2015-06-06 NOTE — Telephone Encounter (Signed)
Patient given appointment date and time.

## 2015-06-06 NOTE — Telephone Encounter (Signed)
-----   Message from Algernon Huxley, RN sent at 06/02/2015 10:19 AM EDT ----- Regarding: [place] Per Cecille Rubin the date of the [place] is fine but I was not sure if the pt knew about the appt yet. Just wanted to make sure since I completed the result note.

## 2015-06-07 ENCOUNTER — Ambulatory Visit (AMBULATORY_SURGERY_CENTER): Payer: BLUE CROSS/BLUE SHIELD | Admitting: Gastroenterology

## 2015-06-07 ENCOUNTER — Encounter: Payer: Self-pay | Admitting: Gastroenterology

## 2015-06-07 VITALS — BP 106/62 | HR 70 | Temp 96.7°F | Resp 20 | Ht 61.5 in | Wt 168.0 lb

## 2015-06-07 DIAGNOSIS — R1013 Epigastric pain: Secondary | ICD-10-CM | POA: Diagnosis not present

## 2015-06-07 DIAGNOSIS — R1314 Dysphagia, pharyngoesophageal phase: Secondary | ICD-10-CM

## 2015-06-07 DIAGNOSIS — K2980 Duodenitis without bleeding: Secondary | ICD-10-CM

## 2015-06-07 MED ORDER — HYOSCYAMINE SULFATE ER 0.375 MG PO TBCR
EXTENDED_RELEASE_TABLET | ORAL | Status: DC
Start: 2015-06-07 — End: 2019-01-27

## 2015-06-07 MED ORDER — SODIUM CHLORIDE 0.9 % IV SOLN: 500.0000 mL | INTRAVENOUS | Status: DC

## 2015-06-07 NOTE — Progress Notes (Signed)
Called to room to assist during endoscopic procedure.  Patient ID and intended procedure confirmed with present staff. Received instructions for my participation in the procedure from the performing physician.  

## 2015-06-07 NOTE — Patient Instructions (Signed)
YOU HAD AN ENDOSCOPIC PROCEDURE TODAY AT [facility]:   Refer to the procedure report that was given to you for any specific questions about what was found during the examination.  If the procedure report does not answer your questions, please call your gastroenterologist to clarify.  If you requested that your care partner not be given the details of your procedure findings, then the procedure report has been included in a sealed envelope for you to review at your convenience later.  YOU SHOULD EXPECT: Some feelings of bloating in the abdomen. Passage of more gas than usual.  Walking can help get rid of the air that was put into your GI tract during the procedure and reduce the bloating. If you had a lower endoscopy (such as a colonoscopy or flexible sigmoidoscopy) you may notice spotting of blood in your stool or on the toilet paper. If you underwent a bowel prep for your procedure, you may not have a normal bowel movement for a few days.  Please Note:  You might notice some irritation and congestion in your nose or some drainage.  This is from the oxygen used during your procedure.  There is no need for concern and it should clear up in a day or so.  SYMPTOMS TO REPORT IMMEDIATELY:     Following upper endoscopy (EGD)  Vomiting of blood or coffee ground material  New chest pain or pain under the shoulder blades  Painful or persistently difficult swallowing  New shortness of breath  Fever of 100F or higher  Black, tarry-looking stools  For urgent or emergent issues, a gastroenterologist can be reached at any hour by calling [phone].   DIET: FOLLOW DILATION HANDOUT  ACTIVITY:  You should plan to take it easy for the rest of today and you should NOT DRIVE or use heavy machinery until tomorrow (because of the sedation medicines used during the test).    FOLLOW UP: Our staff will call the number listed on your records the next business day following your procedure to  check on you and address any questions or concerns that you may have regarding the information given to you following your procedure. If we do not reach you, we will leave a message.  However, if you are feeling well and you are not experiencing any problems, there is no need to return our call.  We will assume that you have returned to your regular daily activities without incident.  If any biopsies were taken you will be contacted by phone or by letter within the next 1-3 weeks.  Please call us at [phone] if you have not heard about the biopsies in 3 weeks.    SIGNATURES/CONFIDENTIALITY: You and/or your care partner have signed paperwork which will be entered into your electronic medical record.  These signatures attest to the fact that that the information above on your After Visit Summary has been reviewed and is understood.  Full responsibility of the confidentiality of this discharge information lies with you and/or your care-partner.   AVOID NSAIDS, resume remainder of medications. Follow up in Office in 4-5 weeks.

## 2015-06-07 NOTE — Op Note (Signed)
[place]  [employer] [address]. [place], [address]   ENDOSCOPY PROCEDURE REPORT  PATIENT: Mary, Benson  MR#: #[id number] BIRTHDATE: 12/05/1970 , 43  yrs. old GENDER: female ENDOSCOPIST: Inda Castle, MD REFERRED BY: PROCEDURE DATE:  06/07/2015 PROCEDURE:  EGD w/ biopsy and Maloney dilation of esophagus ASA CLASS:     Class II INDICATIONS:  dysphagia and epigastric pain. MEDICATIONS: Monitored anesthesia care and Propofol 170 mg IV TOPICAL ANESTHETIC:  DESCRIPTION OF PROCEDURE: After the risks benefits and alternatives of the procedure were thoroughly explained, informed consent was obtained.  The LB [id number] endoscope was introduced through the mouth and advanced to the second portion of the duodenum , Without limitations.  The instrument was slowly withdrawn as the mucosa was fully examined.    STOMACH: Mild erosive gastritis (inflammation) was found in the gastric antrum.   Multiple biopsies were performed using cold forceps.   Except for the findings listed, the EGD was otherwise normal.   DUODENUM: Moderate duodenal inflammation was found in the 2nd part of the duodenum and duodenal bulb.  Retroflexed views revealed no abnormalities.     The scope was then withdrawn from the patient and the procedure completed. cause of complaints of dysphagia a #52 [place] Maloney dilator was passed with mild resistance.  There was no heme.  COMPLICATIONS: There were no immediate complications.  ENDOSCOPIC IMPRESSION: 1.  gastroduodenitis 2.  dysphagia without stricture?"status post Maloney dilation  RECOMMENDATIONS: 1.  continue Protonix 2.  await biopsy results 3.  hyomax for pain 4.  Office visit 4-5 weeks 5.  Avoid NSAIDs  REPEAT EXAM:  eSigned:  Inda Castle, MD 06/07/2015 8:56 AM    CC: Delia Chimes, MD  PATIENT NAME:  Mary, Benson MR#: #[id number]

## 2015-06-07 NOTE — Progress Notes (Signed)
Reviewed and agree with management. Naysa Puskas D. Price Lachapelle, M.D., FACG  

## 2015-06-07 NOTE — Progress Notes (Signed)
Transferred to recovery room. A/O x3, pleased with MAC.  VSS.  Report to Shelia, RN. 

## 2015-06-08 ENCOUNTER — Telehealth: Payer: Self-pay

## 2015-06-08 ENCOUNTER — Ambulatory Visit (HOSPITAL_COMMUNITY)
Admission: RE | Admit: 2015-06-08 | Discharge: 2015-06-08 | Disposition: A | Discharge status: Home / Self Care | Payer: BLUE CROSS/BLUE SHIELD | Source: Ambulatory Visit | Attending: Physician Assistant | Admitting: Physician Assistant

## 2015-06-08 DIAGNOSIS — E041 Nontoxic single thyroid nodule: Secondary | ICD-10-CM | POA: Insufficient documentation

## 2015-06-08 DIAGNOSIS — E049 Nontoxic goiter, unspecified: Secondary | ICD-10-CM | POA: Diagnosis present

## 2015-06-08 NOTE — Telephone Encounter (Signed)
Left a message at [phone] for the pt to call us if any questions or concerns. maw

## 2015-06-13 ENCOUNTER — Encounter: Payer: Self-pay | Admitting: Gastroenterology

## 2015-07-14 ENCOUNTER — Ambulatory Visit: Payer: BLUE CROSS/BLUE SHIELD | Admitting: Gastroenterology

## 2015-07-17 ENCOUNTER — Ambulatory Visit: Payer: BLUE CROSS/BLUE SHIELD | Admitting: Gastroenterology

## 2015-09-08 ENCOUNTER — Ambulatory Visit: Payer: BLUE CROSS/BLUE SHIELD | Admitting: Gastroenterology

## 2016-07-21 ENCOUNTER — Encounter (HOSPITAL_COMMUNITY): Payer: Self-pay | Admitting: Emergency Medicine

## 2016-07-21 ENCOUNTER — Emergency Department (HOSPITAL_COMMUNITY): Payer: No Typology Code available for payment source

## 2016-07-21 ENCOUNTER — Emergency Department (HOSPITAL_COMMUNITY)
Admission: EM | Admit: 2016-07-21 | Discharge: 2016-07-22 | Disposition: A | Discharge status: Home / Self Care | Payer: No Typology Code available for payment source | Attending: Emergency Medicine | Admitting: Emergency Medicine

## 2016-07-21 DIAGNOSIS — R140 Abdominal distension (gaseous): Secondary | ICD-10-CM | POA: Diagnosis not present

## 2016-07-21 DIAGNOSIS — R1084 Generalized abdominal pain: Secondary | ICD-10-CM

## 2016-07-21 DIAGNOSIS — F17210 Nicotine dependence, cigarettes, uncomplicated: Secondary | ICD-10-CM | POA: Insufficient documentation

## 2016-07-21 DIAGNOSIS — Z79899 Other long term (current) drug therapy: Secondary | ICD-10-CM | POA: Insufficient documentation

## 2016-07-21 DIAGNOSIS — E876 Hypokalemia: Secondary | ICD-10-CM | POA: Diagnosis not present

## 2016-07-21 DIAGNOSIS — Z7982 Long term (current) use of aspirin: Secondary | ICD-10-CM | POA: Insufficient documentation

## 2016-07-21 DIAGNOSIS — R102 Pelvic and perineal pain: Secondary | ICD-10-CM

## 2016-07-21 DIAGNOSIS — R197 Diarrhea, unspecified: Secondary | ICD-10-CM

## 2016-07-21 DIAGNOSIS — J069 Acute upper respiratory infection, unspecified: Secondary | ICD-10-CM

## 2016-07-21 DIAGNOSIS — M542 Cervicalgia: Secondary | ICD-10-CM

## 2016-07-21 DIAGNOSIS — R52 Pain, unspecified: Secondary | ICD-10-CM

## 2016-07-21 LAB — COMPREHENSIVE METABOLIC PANEL
ALT: 16 U/L (ref 14–54)
AST: 22 U/L (ref 15–41)
Albumin: 3.9 g/dL (ref 3.5–5.0)
Alkaline Phosphatase: 74 U/L (ref 38–126)
Anion gap: 8 (ref 5–15)
BILIRUBIN TOTAL: 0.5 mg/dL (ref 0.3–1.2)
BUN: 19 mg/dL (ref 6–20)
CHLORIDE: 106 mmol/L (ref 101–111)
CO2: 23 mmol/L (ref 22–32)
Calcium: 8.9 mg/dL (ref 8.9–10.3)
Creatinine, Ser: 1.52 mg/dL — ABNORMAL HIGH (ref 0.44–1.00)
GFR, EST AFRICAN AMERICAN: 47 mL/min — AB (ref >60)
GFR, EST NON AFRICAN AMERICAN: 41 mL/min — AB (ref >60)
Glucose, Bld: 100 mg/dL — ABNORMAL HIGH (ref 65–99)
POTASSIUM: 3.2 mmol/L — AB (ref 3.5–5.1)
Sodium: 137 mmol/L (ref 135–145)
TOTAL PROTEIN: 6.9 g/dL (ref 6.5–8.1)

## 2016-07-21 LAB — URINALYSIS, ROUTINE W REFLEX MICROSCOPIC
Bilirubin Urine: NEGATIVE
GLUCOSE, UA: NEGATIVE mg/dL
KETONES UR: NEGATIVE mg/dL
LEUKOCYTES UA: NEGATIVE
NITRITE: POSITIVE — AB
PROTEIN: NEGATIVE mg/dL
Specific Gravity, Urine: 1.029 (ref 1.005–1.030)
pH: 6 (ref 5.0–8.0)

## 2016-07-21 LAB — URINE MICROSCOPIC-ADD ON

## 2016-07-21 LAB — CBC
HEMATOCRIT: 39.8 % (ref 36.0–46.0)
Hemoglobin: 13.8 g/dL (ref 12.0–15.0)
MCH: 30.1 pg (ref 26.0–34.0)
MCHC: 34.7 g/dL (ref 30.0–36.0)
MCV: 86.9 fL (ref 78.0–100.0)
PLATELETS: 335 10*3/uL (ref 150–400)
RBC: 4.58 MIL/uL (ref 3.87–5.11)
RDW: 13.2 % (ref 11.5–15.5)
WBC: 13.4 10*3/uL — ABNORMAL HIGH (ref 4.0–10.5)

## 2016-07-21 LAB — PREGNANCY, URINE: Preg Test, Ur: NEGATIVE

## 2016-07-21 LAB — LIPASE, BLOOD: LIPASE: 31 U/L (ref 11–51)

## 2016-07-21 MED ORDER — IOPAMIDOL (ISOVUE-300) INJECTION 61%
100.0000 mL | Freq: Once | INTRAVENOUS | Status: AC | PRN
  Administered 2016-07-21: 80 mL via INTRAVENOUS

## 2016-07-21 MED ORDER — SODIUM CHLORIDE 0.9 % IV BOLUS (SEPSIS)
1000.0000 mL | Freq: Once | INTRAVENOUS | Status: AC
  Administered 2016-07-21: 1000 mL via INTRAVENOUS

## 2016-07-21 NOTE — ED Notes (Signed)
US at bedside

## 2016-07-21 NOTE — ED Notes (Signed)
Pt is unable to urinate at this time. 

## 2016-07-21 NOTE — ED Provider Notes (Signed)
[facility] DEPT Provider Note   CSN: [id number] Arrival date & time: 07/21/16  1931     History   Chief Complaint Chief Complaint  Patient presents with  . Neck Pain  . Diarrhea    HPI Mary Benson is a 45 y.o. female with a past medical history of cervical and uterine cancer status post hysterectomy who presents with chief complaint of painful neck mass and abdominal pain. Patient states that over the past several weeks she has had worsening distention and pain in her abdomen. She states she is only eating about 1 meal a day. She denies any constipation and impaction. She's had multiple loose stools and diarrhea today. She denies nausea, vomiting, loss of appetite, soaking night sweats or unexplained weight loss. She denies urinary symptoms. The patient also noticed a painful mass in her left upper neck. She states that it came on suddenly 2 days ago and has progressively enlarged. She denies any dental pain or sore throat. She denies any symptoms of URI.  HPI  Past Medical History:  Diagnosis Date  . Anxiety   . Cancer (HCC)    cervical, uterine,   . Distal radius fracture, right 02/22/2013  . History of cervical cancer   . Laceration of forehead 02/22/2013   sutures in place  . Seasonal allergies   . 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 MDD (major depressive disorder), single episode, severe with psychotic features ([place]) 10/24/2013    Past Surgical History:  Procedure Laterality Date  . ABDOMINAL HYSTERECTOMY     partial  . CESAREAN SECTION  1997  . CHOLECYSTECTOMY    . KNEE ARTHROSCOPY Right 09/2014  . LAPAROSCOPIC LYSIS OF ADHESIONS  01/31/2006  . OPEN REDUCTION INTERNAL FIXATION (ORIF) DISTAL RADIAL FRACTURE Right 02/24/2013   Procedure: OPEN REDUCTION INTERNAL FIXATION (ORIF) RIGHT DISTAL RADIUS FRACTURE;  Surgeon: Jolyn Nap, MD;  Location: [facility];  Service: Orthopedics;  Laterality: Right;    OB History    No data available       Home Medications    Prior to Admission medications   Medication Sig Start Date End Date Taking? Authorizing Provider  aspirin EC 81 MG tablet Take 1 tablet (81 mg total) by mouth daily. Patient taking differently: Take 81 mg by mouth daily as needed for moderate pain.  05/10/15  Yes Elam Dutch, MD  aspirin-acetaminophen-caffeine [facility] MIGRAINE) [phone] MG per tablet Take 2 tablets by mouth every 6 (six) hours as needed for headache or migraine.   Yes Historical Provider, MD  ibuprofen (ADVIL,MOTRIN) 200 MG tablet Take 200 mg by mouth every 6 (six) hours as needed for moderate pain.   Yes Historical Provider, MD  Multiple Vitamins-Minerals (WOMENS MULTIVITAMIN PLUS PO) Take 1 tablet by mouth daily.   Yes Historical Provider, MD  Hyoscyamine Sulfate 0.375 MG TBCR One tablet twice a day for 5 days, then as needed, abdominal pain Patient not taking: Reported on 07/21/2016 06/07/15   Inda Castle, MD  pantoprazole (PROTONIX) 40 MG tablet Take 1 tablet (40 mg total) by mouth daily. Patient not taking: Reported on 07/21/2016 05/30/15   Lori P Hvozdovic, PA-C    Family History Family History  Problem Relation Age of Onset  . Cervical cancer Mother     Social History Social History  Substance Use Topics  . Smoking status:  Current Every Day Smoker    Packs/day: 1.00    Years: 24.00    Types: Cigarettes  . Smokeless tobacco: Never Used  . Alcohol use 0.0 oz/week     Comment: occasionally     Allergies   Hydrocodone and Penicillins   Review of Systems Review of Systems Ten systems reviewed and are negative for acute change, except as noted in the HPI.   Marland Kitchen Physical Exam Updated Vital Signs BP 118/91 (BP Location: Left Arm)   Pulse 95   Temp 98.2 F (36.8 C) (Oral)   Resp 20   Ht 5' (1.524 m)   Wt 70.8 kg   SpO2 100%   BMI 30.47 kg/m   Physical Exam    Constitutional: She is oriented to person, place, and time. She appears well-developed and well-nourished. No distress.  HENT:  Head: Normocephalic and atraumatic.  Eyes: Conjunctivae are normal. No scleral icterus.  Neck: Normal range of motion.  Cardiovascular: Normal rate, regular rhythm and normal heart sounds.  Exam reveals no gallop and no friction rub.   No murmur heard. Pulmonary/Chest: Effort normal and breath sounds normal. No respiratory distress.  Abdominal: Soft. Bowel sounds are normal. She exhibits distension. She exhibits no mass. There is tenderness. There is no guarding.    Neurological: She is alert and oriented to person, place, and time.  Skin: Skin is warm and dry. She is not diaphoretic.  Nursing note and vitals reviewed.    ED Treatments / Results  Labs (all labs ordered are listed, but only abnormal results are displayed) Labs Reviewed  COMPREHENSIVE METABOLIC PANEL - Abnormal; Notable for the following:       Result Value   Potassium 3.2 (*)    Glucose, Bld 100 (*)    Creatinine, Ser 1.52 (*)    GFR calc non Af Amer 41 (*)    GFR calc Af Amer 47 (*)    All other components within normal limits  CBC - Abnormal; Notable for the following:    WBC 13.4 (*)    All other components within normal limits  LIPASE, BLOOD  URINALYSIS, ROUTINE W REFLEX MICROSCOPIC (NOT AT [facility])    EKG  EKG Interpretation None       Radiology No results found.  Procedures Procedures (including critical care time)  Medications Ordered in ED Medications - No data to display   Initial Impression / Assessment and Plan / ED Course  I have reviewed the triage vital signs and the nursing notes.  Pertinent labs & imaging results that were available during my care of the patient were reviewed by me and considered in my medical decision making (see chart for details).  Clinical Course  Comment By Time  Patient with neck mass, abdominal pain and distention. She is an  elevated leukocytosis. I have ordered imaging. Margarita Mail, PA-C 10/22 2115   I have given sign out to PA Lucio Edward who will assume care of the patient for disposition  Final Clinical Impressions(s) / ED Diagnoses   Final diagnoses:  None    New Prescriptions New Prescriptions   No medications on file     Margarita Mail, PA-C 07/22/16 1904    Malvin Johns, MD 07/26/16 [phone]

## 2016-07-21 NOTE — ED Triage Notes (Signed)
Pt states that she noticed a lump/swelling on the L side of her neck 2 days ago. States she has also had  Nausea and diarrhea. Alert and oriented.

## 2016-07-21 NOTE — ED Notes (Signed)
PA at bedside.

## 2016-07-21 NOTE — ED Notes (Signed)
Pt to CT

## 2016-07-22 MED ORDER — PANTOPRAZOLE SODIUM 20 MG PO TBEC: 20.0000 mg | DELAYED_RELEASE_TABLET | Freq: Every day | ORAL | 0 refills | Status: DC

## 2016-07-22 MED ORDER — POTASSIUM CHLORIDE ER 10 MEQ PO TBCR: 20.0000 meq | EXTENDED_RELEASE_TABLET | Freq: Two times a day (BID) | ORAL | 0 refills | Status: DC

## 2016-07-22 MED ORDER — METOCLOPRAMIDE HCL 10 MG PO TABS: 10.0000 mg | ORAL_TABLET | Freq: Three times a day (TID) | ORAL | 0 refills | Status: DC | PRN

## 2016-07-22 MED ORDER — CEPHALEXIN 500 MG PO CAPS
500.0000 mg | ORAL_CAPSULE | Freq: Once | ORAL | Status: DC
  Filled 2016-07-22: qty 1

## 2016-07-22 MED ORDER — DICYCLOMINE HCL 20 MG PO TABS: 20.0000 mg | ORAL_TABLET | Freq: Two times a day (BID) | ORAL | 0 refills | Status: DC

## 2016-07-22 MED ORDER — POTASSIUM CHLORIDE CRYS ER 20 MEQ PO TBCR
20.0000 meq | EXTENDED_RELEASE_TABLET | Freq: Once | ORAL | Status: AC
  Administered 2016-07-22: 20 meq via ORAL
  Filled 2016-07-22: qty 1

## 2016-07-22 NOTE — ED Provider Notes (Signed)
Patient given to me at shift change with imaging pending including neck CT with contrast, abdominal pelvis CT with contrast, pelvic ultrasound. Labs and urinalysis also pending.  Results for orders placed or performed during the hospital encounter of 07/21/16  Lipase, blood  Result Value Ref Range   Lipase 31 11 - 51 U/L  Comprehensive metabolic panel  Result Value Ref Range   Sodium 137 135 - 145 mmol/L   Potassium 3.2 (L) 3.5 - 5.1 mmol/L   Chloride 106 101 - 111 mmol/L   CO2 23 22 - 32 mmol/L   Glucose, Bld 100 (H) 65 - 99 mg/dL   BUN 19 6 - 20 mg/dL   Creatinine, Ser 1.52 (H) 0.44 - 1.00 mg/dL   Calcium 8.9 8.9 - 10.3 mg/dL   Total Protein 6.9 6.5 - 8.1 g/dL   Albumin 3.9 3.5 - 5.0 g/dL   AST 22 15 - 41 U/L   ALT 16 14 - 54 U/L   Alkaline Phosphatase 74 38 - 126 U/L   Total Bilirubin 0.5 0.3 - 1.2 mg/dL   GFR calc non Af Amer 41 (L) >60 mL/min   GFR calc Af Amer 47 (L) >60 mL/min   Anion gap 8 5 - 15  CBC  Result Value Ref Range   WBC 13.4 (H) 4.0 - 10.5 K/uL   RBC 4.58 3.87 - 5.11 MIL/uL   Hemoglobin 13.8 12.0 - 15.0 g/dL   HCT 39.8 36.0 - 46.0 %   MCV 86.9 78.0 - 100.0 fL   MCH 30.1 26.0 - 34.0 pg   MCHC 34.7 30.0 - 36.0 g/dL   RDW 13.2 11.5 - 15.5 %   Platelets 335 150 - 400 K/uL  Urinalysis, Routine w reflex microscopic  Result Value Ref Range   Color, Urine YELLOW YELLOW   APPearance CLOUDY (A) CLEAR   Specific Gravity, Urine 1.029 1.005 - 1.030   pH 6.0 5.0 - 8.0   Glucose, UA NEGATIVE NEGATIVE mg/dL   Hgb urine dipstick TRACE (A) NEGATIVE   Bilirubin Urine NEGATIVE NEGATIVE   Ketones, ur NEGATIVE NEGATIVE mg/dL   Protein, ur NEGATIVE NEGATIVE mg/dL   Nitrite POSITIVE (A) NEGATIVE   Leukocytes, UA NEGATIVE NEGATIVE  Pregnancy, urine  Result Value Ref Range   Preg Test, Ur NEGATIVE NEGATIVE  Urine microscopic-add on  Result Value Ref Range   Squamous Epithelial / LPF 6-30 (A) NONE SEEN   WBC, UA 0-5 0 - 5 WBC/hpf   RBC / HPF 0-5 0 - 5 RBC/hpf    Bacteria, UA MANY (A) NONE SEEN   Ct Soft Tissue Neck W Contrast  Result Date: 07/21/2016 CLINICAL DATA:  LEFT neck lump for 2 days. Nausea and diarrhea. History of seizures and gynecological cancer. EXAM: CT NECK WITH CONTRAST TECHNIQUE: Multidetector CT imaging of the neck was performed using the standard protocol following the bolus administration of intravenous contrast. CONTRAST:  9mL ISOVUE-300 IOPAMIDOL (ISOVUE-300) INJECTION 61% COMPARISON:  None. FINDINGS: Pharynx and larynx: Normal. Salivary glands: Normal. Thyroid: Sub cm nodule RIGHT thyroid lobe is below size followup recommendation. Thyroid gland is otherwise unremarkable. Lymph nodes: Sub cm bilateral level IIa and IIb lymph nodes measure up to 9 mm, likely reactive. A few additional scattered sub cm lymph nodes, bilateral level 5A. No pathologic lymphadenopathy by CT size criteria. Vascular: Trace calcific atherosclerosis of LEFT carotid bifurcation. Limited intracranial: Normal. Visualized orbits: Normal. Mastoids and visualized paranasal sinuses: Well aerated. Skeleton: Absent maxillary teeth. Multiple mandible teeth caries. Moderate C5-6  disc height loss with mild endplate spurring compatible with degenerative disc resulting in mild to moderate neural foraminal narrowing. No destructive bony lesions. Ossified stylohyoid ligaments. Upper chest: Lung apices are clear. No superior mediastinal lymphadenopathy. Other: LEFT posterior upper thoracic subcutaneous fat sub cm nodules likely a lymph node. IMPRESSION: No acute process in the neck; no mass or LEFT neck asymmetry by CT. Scattered lymph nodes are likely reactive, no pathologic lymphadenopathy. Electronically Signed   By: Elon Alas M.D.   On: 07/21/2016 23:50   US Transvaginal Non-ob  Result Date: 07/22/2016 CLINICAL DATA:  Pelvic pain for 1 year. History of hysterectomy for cervical/uterine cancer. EXAM: TRANSABDOMINAL AND TRANSVAGINAL ULTRASOUND OF PELVIS DOPPLER ULTRASOUND  OF OVARIES TECHNIQUE: Both transabdominal and transvaginal ultrasound examinations of the pelvis were performed. Transabdominal technique was performed for global imaging of the pelvis including uterus, ovaries, adnexal regions, and pelvic cul-de-sac. It was necessary to proceed with endovaginal exam following the transabdominal exam to visualize the adnexa and endometrium. Color and duplex Doppler ultrasound was utilized to evaluate blood flow to the ovaries. COMPARISON:  CT abdomen and pelvis May 21, 2016 at 2317 hours FINDINGS: Uterus Surgically absent. Right ovary Measurements: 2.8 x 1.7 x 1.5 cm. Normal appearance/no adnexal mass. Left ovary Measurements: 3.5 x 1.4 x 2 cm. Normal appearance/no adnexal mass. 15 mm dominant follicle. Pulsed Doppler evaluation of both ovaries demonstrates normal low-resistance arterial and venous waveforms. Other findings No abnormal free fluid. IMPRESSION: Negative pelvic ultrasound, status post hysterectomy. Electronically Signed   By: Elon Alas M.D.   On: 07/22/2016 00:28   US Pelvis Complete  Result Date: 07/22/2016 CLINICAL DATA:  Pelvic pain for 1 year. History of hysterectomy for cervical/uterine cancer. EXAM: TRANSABDOMINAL AND TRANSVAGINAL ULTRASOUND OF PELVIS DOPPLER ULTRASOUND OF OVARIES TECHNIQUE: Both transabdominal and transvaginal ultrasound examinations of the pelvis were performed. Transabdominal technique was performed for global imaging of the pelvis including uterus, ovaries, adnexal regions, and pelvic cul-de-sac. It was necessary to proceed with endovaginal exam following the transabdominal exam to visualize the adnexa and endometrium. Color and duplex Doppler ultrasound was utilized to evaluate blood flow to the ovaries. COMPARISON:  CT abdomen and pelvis May 21, 2016 at 2317 hours FINDINGS: Uterus Surgically absent. Right ovary Measurements: 2.8 x 1.7 x 1.5 cm. Normal appearance/no adnexal mass. Left ovary Measurements: 3.5 x 1.4 x 2 cm.  Normal appearance/no adnexal mass. 15 mm dominant follicle. Pulsed Doppler evaluation of both ovaries demonstrates normal low-resistance arterial and venous waveforms. Other findings No abnormal free fluid. IMPRESSION: Negative pelvic ultrasound, status post hysterectomy. Electronically Signed   By: Elon Alas M.D.   On: 07/22/2016 00:28   Ct Abdomen Pelvis W Contrast  Result Date: 07/22/2016 CLINICAL DATA:  Nausea, diarrhea. Abdominal pain and distention. History of cervical cancer and hysterectomy. EXAM: CT ABDOMEN AND PELVIS WITH CONTRAST TECHNIQUE: Multidetector CT imaging of the abdomen and pelvis was performed using the standard protocol following bolus administration of intravenous contrast. CONTRAST:  37mL ISOVUE-300 IOPAMIDOL (ISOVUE-300) INJECTION 61% COMPARISON:  Pelvic ultrasound July 21, 2016 at 2208 hours and CT abdomen and pelvis April 24, 2015 FINDINGS: LOWER CHEST: Mild bronchial wall thickening. Included heart size is normal. No pericardial effusion. HEPATOBILIARY: The liver is normal.  Status post cholecystectomy. PANCREAS: Hypoplastic pancreatic body and tail. Normal pancreatic head. SPLEEN: Normal. ADRENALS/URINARY TRACT: Kidneys are orthotopic, demonstrating symmetric enhancement. No nephrolithiasis, hydronephrosis or solid renal masses. The unopacified ureters are normal in course and caliber. Delayed imaging through the kidneys demonstrates symmetric  prompt contrast excretion within the proximal urinary collecting system. Urinary bladder is partially distended and unremarkable. Normal adrenal glands. STOMACH/BOWEL: The stomach, small and large bowel are normal in course and caliber without inflammatory changes, evaluation limited by lack of oral contrast. Normal appendix. VASCULAR/LYMPHATIC: Aortoiliac vessels are normal in course and caliber, mild to moderate calcific atherosclerosis. No lymphadenopathy by CT size criteria. REPRODUCTIVE: Testis hysterectomy. OTHER: No  intraperitoneal free fluid or free air. MUSCULOSKELETAL: Nonacute. Anterior abdominal wall scarring. Mild degenerative change of the lumbar spine. IMPRESSION: No acute intra-abdominal or pelvic process. Bronchial wall thickening associated with reactive airway disease or bronchitis. Mild to moderate atherosclerosis. Electronically Signed   By: Elon Alas M.D.   On: 07/22/2016 01:30   [place] Art/ven Flow Abd Pelv Doppler  Result Date: 07/22/2016 CLINICAL DATA:  Pelvic pain for 1 year. History of hysterectomy for cervical/uterine cancer. EXAM: TRANSABDOMINAL AND TRANSVAGINAL ULTRASOUND OF PELVIS DOPPLER ULTRASOUND OF OVARIES TECHNIQUE: Both transabdominal and transvaginal ultrasound examinations of the pelvis were performed. Transabdominal technique was performed for global imaging of the pelvis including uterus, ovaries, adnexal regions, and pelvic cul-de-sac. It was necessary to proceed with endovaginal exam following the transabdominal exam to visualize the adnexa and endometrium. Color and duplex Doppler ultrasound was utilized to evaluate blood flow to the ovaries. COMPARISON:  CT abdomen and pelvis May 21, 2016 at 2317 hours FINDINGS: Uterus Surgically absent. Right ovary Measurements: 2.8 x 1.7 x 1.5 cm. Normal appearance/no adnexal mass. Left ovary Measurements: 3.5 x 1.4 x 2 cm. Normal appearance/no adnexal mass. 15 mm dominant follicle. Pulsed Doppler evaluation of both ovaries demonstrates normal low-resistance arterial and venous waveforms. Other findings No abnormal free fluid. IMPRESSION: Negative pelvic ultrasound, status post hysterectomy. Electronically Signed   By: Elon Alas M.D.   On: 07/22/2016 00:28   Results of CT neck negative for acute process, mass or asymmetry, pertinent for likely reactive lymph nodes. CT abd/pelvis and pelvic US negative.  Discussed results with patient. UA was nitrite positive with many bacteria, discussed with pt who feels asymptomatic, will not  treat at this time, however pt is aware to follow up with PCP is experiencing any urinary sx. Diagnosis of URI likely the cause of reactive lymphadenopathy.  This may also be the etiology of some of her GI complaints, however she explained that she has more than 1 year of abdominal bloating, intermittent discomfort, loss of appetite.  Tx with PPI, bentyl and reglan.  Given GI follow up.  Pt was discharged home in good condition with stable vital signs, tolerating PO's, return precautions reviewed and she verbalized understanding.  She will follow up outpt with her PCP and GI.   Delsa Grana, PA-C 07/30/16 1742    Davonna Belling, MD 07/31/16 [phone]

## 2016-12-23 ENCOUNTER — Encounter (HOSPITAL_COMMUNITY): Payer: Self-pay | Admitting: Emergency Medicine

## 2016-12-23 ENCOUNTER — Emergency Department (HOSPITAL_COMMUNITY)
Admission: EM | Admit: 2016-12-23 | Discharge: 2016-12-23 | Disposition: A | Discharge status: Home / Self Care | Payer: No Typology Code available for payment source | Attending: Emergency Medicine | Admitting: Emergency Medicine

## 2016-12-23 ENCOUNTER — Emergency Department (HOSPITAL_COMMUNITY): Payer: No Typology Code available for payment source

## 2016-12-23 DIAGNOSIS — Y929 Unspecified place or not applicable: Secondary | ICD-10-CM | POA: Insufficient documentation

## 2016-12-23 DIAGNOSIS — Z7982 Long term (current) use of aspirin: Secondary | ICD-10-CM | POA: Diagnosis not present

## 2016-12-23 DIAGNOSIS — S86812A Strain of other muscle(s) and tendon(s) at lower leg level, left leg, initial encounter: Secondary | ICD-10-CM | POA: Insufficient documentation

## 2016-12-23 DIAGNOSIS — Y9389 Activity, other specified: Secondary | ICD-10-CM | POA: Insufficient documentation

## 2016-12-23 DIAGNOSIS — S86112A Strain of other muscle(s) and tendon(s) of posterior muscle group at lower leg level, left leg, initial encounter: Secondary | ICD-10-CM

## 2016-12-23 DIAGNOSIS — Z8541 Personal history of malignant neoplasm of cervix uteri: Secondary | ICD-10-CM | POA: Diagnosis not present

## 2016-12-23 DIAGNOSIS — S8992XA Unspecified injury of left lower leg, initial encounter: Secondary | ICD-10-CM | POA: Diagnosis present

## 2016-12-23 DIAGNOSIS — X509XXA Other and unspecified overexertion or strenuous movements or postures, initial encounter: Secondary | ICD-10-CM | POA: Insufficient documentation

## 2016-12-23 DIAGNOSIS — F17210 Nicotine dependence, cigarettes, uncomplicated: Secondary | ICD-10-CM | POA: Insufficient documentation

## 2016-12-23 DIAGNOSIS — Y999 Unspecified external cause status: Secondary | ICD-10-CM | POA: Insufficient documentation

## 2016-12-23 MED ORDER — KETOROLAC TROMETHAMINE 15 MG/ML IJ SOLN
15.0000 mg | Freq: Once | INTRAMUSCULAR | Status: AC
  Administered 2016-12-23: 15 mg via INTRAMUSCULAR
  Filled 2016-12-23: qty 1

## 2016-12-23 MED ORDER — LORAZEPAM 1 MG PO TABS
1.0000 mg | ORAL_TABLET | Freq: Once | ORAL | Status: AC
  Administered 2016-12-23: 1 mg via ORAL
  Filled 2016-12-23: qty 1

## 2016-12-23 MED ORDER — OXYCODONE-ACETAMINOPHEN 5-325 MG PO TABS: 1.0000 | ORAL_TABLET | Freq: Four times a day (QID) | ORAL | 0 refills | Status: DC | PRN

## 2016-12-23 MED ORDER — NAPROXEN 375 MG PO TABS: 375.0000 mg | ORAL_TABLET | Freq: Two times a day (BID) | ORAL | 0 refills | Status: DC | PRN

## 2016-12-23 MED ORDER — DIAZEPAM 5 MG PO TABS: 5.0000 mg | ORAL_TABLET | Freq: Three times a day (TID) | ORAL | 0 refills | Status: DC | PRN

## 2016-12-23 MED ORDER — HYDROMORPHONE HCL 1 MG/ML IJ SOLN
0.5000 mg | Freq: Once | INTRAMUSCULAR | Status: AC
  Administered 2016-12-23: 0.5 mg via INTRAMUSCULAR
  Filled 2016-12-23: qty 1

## 2016-12-23 NOTE — ED Provider Notes (Signed)
[facility] DEPT Provider Note   CSN: [id number] Arrival date & time: 12/23/16  0744     History   Chief Complaint Chief Complaint  Patient presents with  . Leg Pain    HPI Mary Benson is a 46 y.o. female.  HPI   45yF with L lower leg/calf pain. She was pushing a vehicle when she had sudden onset of severe pain in L calf and behind the knee. She "felt a pop." Pain exacerbated by any type of movement. Cannot bear weight on it. Family reports "she almost punched me when I took off her boot." No numbness or tingling.  Past Medical History:  Diagnosis Date  . Anxiety   . Cancer (HCC)    cervical, uterine,   . Distal radius fracture, right 02/22/2013  . History of cervical cancer   . Laceration of forehead 02/22/2013   sutures in place  . Seasonal allergies   . Seizures ([place])    last seizure > 1 yr.; seizures caused by head trauma    Patient Active Problem List   Diagnosis Date Noted  . MDD (major depressive disorder), single episode, severe with psychotic features ([place]) 10/24/2013    Past Surgical History:  Procedure Laterality Date  . ABDOMINAL HYSTERECTOMY     partial  . CESAREAN SECTION  1997  . CHOLECYSTECTOMY    . KNEE ARTHROSCOPY Right 09/2014  . LAPAROSCOPIC LYSIS OF ADHESIONS  01/31/2006  . OPEN REDUCTION INTERNAL FIXATION (ORIF) DISTAL RADIAL FRACTURE Right 02/24/2013   Procedure: OPEN REDUCTION INTERNAL FIXATION (ORIF) RIGHT DISTAL RADIUS FRACTURE;  Surgeon: Jolyn Nap, MD;  Location: [place];  Service: Orthopedics;  Laterality: Right;    OB History    No data available       Home Medications    Prior to Admission medications   Medication Sig Start Date End Date Taking? Authorizing Provider  aspirin EC 81 MG tablet Take 1 tablet (81 mg total) by mouth daily. Patient taking differently: Take 81 mg by mouth daily as needed for moderate pain.  05/10/15   Elam Dutch, MD  aspirin-acetaminophen-caffeine [facility]  MIGRAINE) [phone] MG per tablet Take 2 tablets by mouth every 6 (six) hours as needed for headache or migraine.    Historical Provider, MD  dicyclomine (BENTYL) 20 MG tablet Take 1 tablet (20 mg total) by mouth 2 (two) times daily. 07/22/16   Delsa Grana, PA-C  Hyoscyamine Sulfate 0.375 MG TBCR One tablet twice a day for 5 days, then as needed, abdominal pain Patient not taking: Reported on 07/21/2016 06/07/15   Inda Castle, MD  ibuprofen (ADVIL,MOTRIN) 200 MG tablet Take 200 mg by mouth every 6 (six) hours as needed for moderate pain.    Historical Provider, MD  metoCLOPramide (REGLAN) 10 MG tablet Take 1 tablet (10 mg total) by mouth 3 (three) times daily as needed for nausea (headache / nausea). 07/22/16   Delsa Grana, PA-C  Multiple Vitamins-Minerals (WOMENS MULTIVITAMIN PLUS PO) Take 1 tablet by mouth daily.    Historical Provider, MD  pantoprazole (PROTONIX) 20 MG tablet Take 1 tablet (20 mg total) by mouth daily. 07/22/16   Delsa Grana, PA-C  pantoprazole (PROTONIX) 40 MG tablet Take 1 tablet (40 mg total) by mouth daily. Patient not taking: Reported on 07/21/2016 05/30/15   Lori P Hvozdovic, PA-C  potassium chloride (K-DUR) 10 MEQ tablet Take 2 tablets (20 mEq total) by mouth 2 (two) times daily. 07/22/16   Delsa Grana, PA-C  Family History Family History  Problem Relation Age of Onset  . Cervical cancer Mother     Social History Social History  Substance Use Topics  . Smoking status: Current Every Day Smoker    Packs/day: 1.00    Years: 24.00    Types: Cigarettes  . Smokeless tobacco: Never Used  . Alcohol use 0.0 oz/week     Comment: occasionally     Allergies   Hydrocodone and Penicillins   Review of Systems Review of Systems  All systems reviewed and negative, other than as noted in HPI.   Physical Exam Updated Vital Signs Wt 156 lb (70.8 kg)   BMI 30.47 kg/m   Physical Exam  Constitutional: She appears well-developed and well-nourished. No distress.   Sitting up in bed. Appears uncomfortable.   HENT:  Head: Normocephalic and atraumatic.  Eyes: Conjunctivae are normal. Right eye exhibits no discharge. Left eye exhibits no discharge.  Neck: Neck supple.  Cardiovascular: Normal rate, regular rhythm and normal heart sounds.  Exam reveals no gallop and no friction rub.   No murmur heard. Pulmonary/Chest: Effort normal and breath sounds normal. No respiratory distress.  Abdominal: Soft. She exhibits no distension. There is no tenderness.  Musculoskeletal: She exhibits no edema or tenderness.  Pt sitting up in bed positioning herself with L hip and knee partially flexed and supporting L knee with her hands. Severe TTP posteriorly along calf extending into distal thigh near femoral condyles. Tender to a lesser degree along achilles. No tenderness anteriorly. No significant knee effusion. Knee seems stable but exam is limited by how apprehensive she is. Mild swelling L calf, but no discrete mass/bulge palpated. Compartments soft. Both active ROM of foot and passive stretching with severe pain. Foot warm. Palpable DP pulse. Sensation in foot is intact to light touch.   Neurological: She is alert.  Skin: Skin is warm and dry.  Psychiatric: She has a normal mood and affect. Her behavior is normal. Thought content normal.  Nursing note and vitals reviewed.    ED Treatments / Results  Labs (all labs ordered are listed, but only abnormal results are displayed) Labs Reviewed - No data to display  EKG  EKG Interpretation None       Radiology Dg Tibia/fibula Left  Result Date: 12/23/2016 CLINICAL DATA:  Pain. Popping in back of knee. This occurred earlier today. Pushing accident. EXAM: LEFT TIBIA AND FIBULA - 2 VIEW COMPARISON:  None. FINDINGS: There is no evidence of fracture or other focal bone lesions. Soft tissues are unremarkable. IMPRESSION: Negative. Electronically Signed   By: Staci Righter M.D.   On: 12/23/2016 09:08   Dg Knee Complete  4 Views Left  Result Date: 12/23/2016 CLINICAL DATA:  Pain after punching heavy object EXAM: LEFT KNEE - COMPLETE 4+ VIEW COMPARISON:  None. FINDINGS: Frontal, lateral, and bilateral oblique views were obtained. No fracture or dislocation evident. No joint effusion. The joint spaces appear normal. No erosive change. IMPRESSION: No fracture or joint effusion.  No evident arthropathy. Electronically Signed   By: Lowella Grip III M.D.   On: 12/23/2016 09:00    Procedures Procedures (including critical care time)  Medications Ordered in ED Medications  ketorolac (TORADOL) 15 MG/ML injection 15 mg (15 mg Intramuscular Given 12/23/16 0819)  LORazepam (ATIVAN) tablet 1 mg (1 mg Oral Given 12/23/16 0819)  HYDROmorphone (DILAUDID) injection 0.5 mg (0.5 mg Intramuscular Given 12/23/16 0819)     Initial Impression / Assessment and Plan / ED Course  I  have reviewed the triage vital signs and the nursing notes.  Pertinent labs & imaging results that were available during my care of the patient were reviewed by me and considered in my medical decision making (see chart for details).    45yF with L calf/L lower extremity pain. Symptoms, exam and mechanism consistent with likely gastrocnemius strain/tear. Compartments soft. NVI. Will XR with how tender she is, but I suspect this is a soft tissue injury. Will reassess after medications. If imaging negative and no other concerning features on re-exam then plan splint for comfort, crutches, PRN pain meds and ortho FU.  10:04 AM Imaging unremarkable. Pain somewhat improved. Exam about the same. Less diffuse tenderness. Localized more to medial head of gastroc. She has previously seen [facility]. Outpt ortho follow-up.    Final Clinical Impressions(s) / ED Diagnoses   Final diagnoses:  Gastrocnemius strain, left, initial encounter    New Prescriptions New Prescriptions   No medications on file     Virgel Manifold, MD 12/23/16 1005

## 2016-12-23 NOTE — ED Triage Notes (Signed)
Pt in with c/o stabbing L lower leg pain after hearing "pop" while pushing behind a truck this morning. Pt states the pain is mostly behind L calf, shoots up to posterior knee and c/o pain/pressure to L toes. Good sensation to L toes, very limited movement and pt unable to bear weight on L leg.

## 2016-12-31 ENCOUNTER — Ambulatory Visit (HOSPITAL_COMMUNITY)
Admission: RE | Admit: 2016-12-31 | Discharge: 2016-12-31 | Disposition: A | Discharge status: Home / Self Care | Payer: No Typology Code available for payment source | Source: Ambulatory Visit | Attending: Orthopedic Surgery | Admitting: Orthopedic Surgery

## 2016-12-31 ENCOUNTER — Other Ambulatory Visit (HOSPITAL_COMMUNITY): Payer: Self-pay | Admitting: Orthopedic Surgery

## 2016-12-31 DIAGNOSIS — M79605 Pain in left leg: Secondary | ICD-10-CM

## 2016-12-31 DIAGNOSIS — M7989 Other specified soft tissue disorders: Secondary | ICD-10-CM | POA: Insufficient documentation

## 2016-12-31 NOTE — Progress Notes (Signed)
VASCULAR LAB PRELIMINARY  PRELIMINARY  PRELIMINARY  PRELIMINARY  Left lower extremity venous duplex completed.    Preliminary report:  Left:  No evidence of DVT, superficial thrombosis, or Baker's cyst.  Tava Peery, RVS 12/31/2016, 11:49 AM

## 2017-02-21 ENCOUNTER — Emergency Department (HOSPITAL_COMMUNITY)
Admission: EM | Admit: 2017-02-21 | Discharge: 2017-02-21 | Disposition: A | Discharge status: Home / Self Care | Payer: No Typology Code available for payment source | Attending: Emergency Medicine | Admitting: Emergency Medicine

## 2017-02-21 ENCOUNTER — Emergency Department (HOSPITAL_COMMUNITY): Payer: No Typology Code available for payment source

## 2017-02-21 ENCOUNTER — Encounter (HOSPITAL_COMMUNITY): Payer: Self-pay

## 2017-02-21 DIAGNOSIS — Z7982 Long term (current) use of aspirin: Secondary | ICD-10-CM | POA: Insufficient documentation

## 2017-02-21 DIAGNOSIS — R791 Abnormal coagulation profile: Secondary | ICD-10-CM | POA: Diagnosis not present

## 2017-02-21 DIAGNOSIS — Z8541 Personal history of malignant neoplasm of cervix uteri: Secondary | ICD-10-CM | POA: Insufficient documentation

## 2017-02-21 DIAGNOSIS — R0789 Other chest pain: Secondary | ICD-10-CM | POA: Insufficient documentation

## 2017-02-21 DIAGNOSIS — R079 Chest pain, unspecified: Secondary | ICD-10-CM | POA: Diagnosis present

## 2017-02-21 DIAGNOSIS — F17210 Nicotine dependence, cigarettes, uncomplicated: Secondary | ICD-10-CM | POA: Diagnosis not present

## 2017-02-21 DIAGNOSIS — Z79899 Other long term (current) drug therapy: Secondary | ICD-10-CM | POA: Diagnosis not present

## 2017-02-21 DIAGNOSIS — R202 Paresthesia of skin: Secondary | ICD-10-CM | POA: Diagnosis not present

## 2017-02-21 LAB — CBC
HCT: 42.9 % (ref 36.0–46.0)
Hemoglobin: 15.3 g/dL — ABNORMAL HIGH (ref 12.0–15.0)
MCH: 30.3 pg (ref 26.0–34.0)
MCHC: 35.7 g/dL (ref 30.0–36.0)
MCV: 85 fL (ref 78.0–100.0)
PLATELETS: 343 10*3/uL (ref 150–400)
RBC: 5.05 MIL/uL (ref 3.87–5.11)
RDW: 13.2 % (ref 11.5–15.5)
WBC: 12.6 10*3/uL — ABNORMAL HIGH (ref 4.0–10.5)

## 2017-02-21 LAB — RAPID URINE DRUG SCREEN, HOSP PERFORMED
Amphetamines: NOT DETECTED
BARBITURATES: NOT DETECTED
BENZODIAZEPINES: NOT DETECTED
COCAINE: NOT DETECTED
OPIATES: NOT DETECTED
TETRAHYDROCANNABINOL: NOT DETECTED

## 2017-02-21 LAB — COMPREHENSIVE METABOLIC PANEL
ALBUMIN: 4 g/dL (ref 3.5–5.0)
ALT: 28 U/L (ref 14–54)
AST: 27 U/L (ref 15–41)
Alkaline Phosphatase: 80 U/L (ref 38–126)
Anion gap: 10 (ref 5–15)
BUN: 13 mg/dL (ref 6–20)
CHLORIDE: 105 mmol/L (ref 101–111)
CO2: 22 mmol/L (ref 22–32)
Calcium: 9.2 mg/dL (ref 8.9–10.3)
Creatinine, Ser: 0.88 mg/dL (ref 0.44–1.00)
GFR calc Af Amer: >60 mL/min (ref >60)
GFR calc non Af Amer: >60 mL/min (ref >60)
GLUCOSE: 101 mg/dL — AB (ref 65–99)
POTASSIUM: 3.6 mmol/L (ref 3.5–5.1)
SODIUM: 137 mmol/L (ref 135–145)
Total Bilirubin: 0.5 mg/dL (ref 0.3–1.2)
Total Protein: 7.3 g/dL (ref 6.5–8.1)

## 2017-02-21 LAB — PROTIME-INR
INR: 0.96
Prothrombin Time: 12.8 seconds (ref 11.4–15.2)

## 2017-02-21 LAB — DIFFERENTIAL
BASOS ABS: 0 10*3/uL (ref 0.0–0.1)
BASOS PCT: 0 %
EOS ABS: 0.1 10*3/uL (ref 0.0–0.7)
Eosinophils Relative: 1 %
Lymphocytes Relative: 19 %
Lymphs Abs: 2.5 10*3/uL (ref 0.7–4.0)
Monocytes Absolute: 0.9 10*3/uL (ref 0.1–1.0)
Monocytes Relative: 7 %
NEUTROS PCT: 73 %
Neutro Abs: 9.2 10*3/uL — ABNORMAL HIGH (ref 1.7–7.7)

## 2017-02-21 LAB — I-STAT CHEM 8, ED
BUN: 16 mg/dL (ref 6–20)
CREATININE: 0.9 mg/dL (ref 0.44–1.00)
Calcium, Ion: 1.11 mmol/L — ABNORMAL LOW (ref 1.15–1.40)
Chloride: 105 mmol/L (ref 101–111)
Glucose, Bld: 98 mg/dL (ref 65–99)
HEMATOCRIT: 44 % (ref 36.0–46.0)
HEMOGLOBIN: 15 g/dL (ref 12.0–15.0)
POTASSIUM: 3.6 mmol/L (ref 3.5–5.1)
Sodium: 139 mmol/L (ref 135–145)
TCO2: 24 mmol/L (ref 0–100)

## 2017-02-21 LAB — URINALYSIS, ROUTINE W REFLEX MICROSCOPIC
Bilirubin Urine: NEGATIVE
Glucose, UA: NEGATIVE mg/dL
Ketones, ur: NEGATIVE mg/dL
Leukocytes, UA: NEGATIVE
Nitrite: POSITIVE — AB
PH: 6 (ref 5.0–8.0)
Protein, ur: NEGATIVE mg/dL
SPECIFIC GRAVITY, URINE: 1.008 (ref 1.005–1.030)

## 2017-02-21 LAB — ETHANOL

## 2017-02-21 LAB — I-STAT TROPONIN, ED: Troponin i, poc: 0 ng/mL (ref 0.00–0.08)

## 2017-02-21 LAB — APTT: APTT: 32 s (ref 24–36)

## 2017-02-21 NOTE — ED Notes (Signed)
Pt transported to and from CT scanner on stretcher with tech, tolerated well. 

## 2017-02-21 NOTE — ED Provider Notes (Signed)
[facility] DEPT Provider Note   CSN: [id number] Arrival date & time: 02/21/17  1139     History   Chief Complaint Chief Complaint  Patient presents with  . Chest Pain    HPI Mary Benson is a 46 y.o. female.  PT is a 46 year old female with a history of prior panic attacks and petit mal seizures. Her last seizure she states was several years ago. She's no longer on antiepileptic medication. She states that she was driving with a coworker today and had a sudden onset of what she describes of his discomfort across her chest with monitoring of her chest. This was followed by tingling in both of her hands. She also felt like her right face was drawing up. She felt like her right face was numb. She felt like her right arm is a little bit weaker than her left. She denies any leg involvement. She did note some slurred speech. No vision changes. She states her symptoms have improved but she still feels like her right face is non-and still feels a little tingling in both of her hands.      Past Medical History:  Diagnosis Date  . Anxiety   . Cancer (HCC)    cervical, uterine,   . Distal radius fracture, right 02/22/2013  . History of cervical cancer   . Laceration of forehead 02/22/2013   sutures in place  . Seasonal allergies   . Seizures ([place])    last seizure > 1 yr.; seizures caused by head trauma    Patient Active Problem List   Diagnosis Date Noted  . MDD (major depressive disorder), single episode, severe with psychotic features ([place]) 10/24/2013    Past Surgical History:  Procedure Laterality Date  . ABDOMINAL HYSTERECTOMY     partial  . CESAREAN SECTION  1997  . CHOLECYSTECTOMY    . KNEE ARTHROSCOPY Right 09/2014  . LAPAROSCOPIC LYSIS OF ADHESIONS  01/31/2006  . OPEN REDUCTION INTERNAL FIXATION (ORIF) DISTAL RADIAL FRACTURE Right 02/24/2013   Procedure: OPEN REDUCTION INTERNAL FIXATION (ORIF) RIGHT DISTAL RADIUS FRACTURE;  Surgeon: Jolyn Nap, MD;   Location: [facility];  Service: Orthopedics;  Laterality: Right;    OB History    No data available       Home Medications    Prior to Admission medications   Medication Sig Start Date End Date Taking? Authorizing Provider  aspirin EC 81 MG tablet Take 1 tablet (81 mg total) by mouth daily. Patient taking differently: Take 81 mg by mouth daily as needed for moderate pain.  05/10/15   Elam Dutch, MD  aspirin-acetaminophen-caffeine (EXCEDRIN MIGRAINE) [phone] MG per tablet Take 2 tablets by mouth every 6 (six) hours as needed for headache or migraine.    [provider]  diazepam (VALIUM) 5 MG tablet Take 1 tablet (5 mg total) by mouth every 8 (eight) hours as needed for muscle spasms. 12/23/16   Virgel Manifold, MD  dicyclomine (BENTYL) 20 MG tablet Take 1 tablet (20 mg total) by mouth 2 (two) times daily. 07/22/16   Delsa Grana, PA-C  Hyoscyamine Sulfate 0.375 MG TBCR One tablet twice a day for 5 days, then as needed, abdominal pain Patient not taking: Reported on 07/21/2016 06/07/15   Inda Castle, MD  ibuprofen (ADVIL,MOTRIN) 200 MG tablet Take 200 mg by mouth every 6 (six) hours as needed for moderate pain.    [provider]  metoCLOPramide (REGLAN) 10 MG tablet Take 1 tablet (10 mg  total) by mouth 3 (three) times daily as needed for nausea (headache / nausea). 07/22/16   Delsa Grana, PA-C  Multiple Vitamins-Minerals (WOMENS MULTIVITAMIN PLUS PO) Take 1 tablet by mouth daily.    [provider]  naproxen (NAPROSYN) 375 MG tablet Take 1 tablet (375 mg total) by mouth 2 (two) times daily as needed (pain). 12/23/16   Virgel Manifold, MD  oxyCODONE-acetaminophen (PERCOCET/ROXICET) 5-325 MG tablet Take 1 tablet by mouth every 6 (six) hours as needed for severe pain. 12/23/16   Virgel Manifold, MD  pantoprazole (PROTONIX) 20 MG tablet Take 1 tablet (20 mg total) by mouth daily. 07/22/16   Delsa Grana, PA-C  pantoprazole (PROTONIX) 40 MG tablet  Take 1 tablet (40 mg total) by mouth daily. Patient not taking: Reported on 07/21/2016 05/30/15   Hvozdovic, Lori P, PA-C  potassium chloride (K-DUR) 10 MEQ tablet Take 2 tablets (20 mEq total) by mouth 2 (two) times daily. 07/22/16   Delsa Grana, PA-C    Family History Family History  Problem Relation Age of Onset  . Cervical cancer Mother     Social History Social History  Substance Use Topics  . Smoking status: Current Every Day Smoker    Packs/day: 1.00    Years: 24.00    Types: Cigarettes  . Smokeless tobacco: Never Used  . Alcohol use 0.0 oz/week     Comment: occasionally     Allergies   Hydrocodone and Penicillins   Review of Systems Review of Systems  Constitutional: Negative for chills, diaphoresis, fatigue and fever.  HENT: Negative for congestion, rhinorrhea and sneezing.   Eyes: Negative.   Respiratory: Negative for cough, chest tightness and shortness of breath.   Cardiovascular: Positive for chest pain. Negative for leg swelling.  Gastrointestinal: Negative for abdominal pain, blood in stool, diarrhea, nausea and vomiting.  Genitourinary: Negative for difficulty urinating, flank pain, frequency and hematuria.  Musculoskeletal: Negative for arthralgias and back pain.  Skin: Negative for rash.  Neurological: Positive for speech difficulty, weakness and numbness. Negative for dizziness and headaches.     Physical Exam Updated Vital Signs BP 121/83   Pulse 83   Temp 98.7 F (37.1 C)   Resp 15   Ht 5\' 1"  (1.549 m)   Wt 74.8 kg (165 lb)   SpO2 98%   BMI 31.18 kg/m   Physical Exam  Constitutional: She is oriented to person, place, and time. She appears well-developed and well-nourished.  HENT:  Head: Normocephalic and atraumatic.  Eyes: Pupils are equal, round, and reactive to light.  Neck: Normal range of motion. Neck supple.  Cardiovascular: Normal rate, regular rhythm and normal heart sounds.   Pulmonary/Chest: Effort normal and breath sounds  normal. No respiratory distress. She has no wheezes. She has no rales. She exhibits no tenderness.  Abdominal: Soft. Bowel sounds are normal. There is no tenderness. There is no rebound and no guarding.  Musculoskeletal: Normal range of motion. She exhibits no edema.  Lymphadenopathy:    She has no cervical adenopathy.  Neurological: She is alert and oriented to person, place, and time.  Motor 5 out of 5 all extremities, sensation grossly intact to light touch all extremities, finger-nose intact, no pronator drift, patient has some numbness to light touch throughout the right side of the face including the forehead. I don'tany facial drooping. Normal tongue protrusion. Normal shoulder shrug. No visual field deficits.  Skin: Skin is warm and dry. No rash noted.  Psychiatric: She has a normal mood and affect.  ED Treatments / Results  Labs (all labs ordered are listed, but only abnormal results are displayed) Labs Reviewed  CBC - Abnormal; Notable for the following:       Result Value   WBC 12.6 (*)    Hemoglobin 15.3 (*)    All other components within normal limits  DIFFERENTIAL - Abnormal; Notable for the following:    Neutro Abs 9.2 (*)    All other components within normal limits  COMPREHENSIVE METABOLIC PANEL - Abnormal; Notable for the following:    Glucose, Bld 101 (*)    All other components within normal limits  URINALYSIS, ROUTINE W REFLEX MICROSCOPIC - Abnormal; Notable for the following:    Hgb urine dipstick SMALL (*)    Nitrite POSITIVE (*)    Bacteria, UA RARE (*)    Squamous Epithelial / LPF 0-5 (*)    All other components within normal limits  I-STAT CHEM 8, ED - Abnormal; Notable for the following:    Calcium, Ion 1.11 (*)    All other components within normal limits  URINE CULTURE  ETHANOL  PROTIME-INR  APTT  RAPID URINE DRUG SCREEN, HOSP PERFORMED  I-STAT TROPOININ, ED    EKG  EKG Interpretation  Date/Time:  Friday Feb 21 2017 11:45:50  EDT Ventricular Rate:  82 PR Interval:    QRS Duration: 76 QT Interval:  400 QTC Calculation: 468 R Axis:   118 Text Interpretation:  Right and left arm electrode reversal, interpretation assumes no reversal Sinus rhythm Probable lateral infarct, old Confirmed by Malvin Johns [phone]) on 02/21/2017 11:52:44 AM       Radiology Ct Head Wo Contrast  Result Date: 02/21/2017 CLINICAL DATA:  Acute onset chest pain and subsequent right face and arm numbness today. EXAM: CT HEAD WITHOUT CONTRAST TECHNIQUE: Contiguous axial images were obtained from the base of the skull through the vertex without intravenous contrast. COMPARISON:  Head CT scan 10/21/2013. FINDINGS: Brain: Appears normal without hemorrhage, infarct, mass lesion, mass effect, midline shift or abnormal extra-axial fluid collection. No hydrocephalus or pneumocephalus. Vascular: Atherosclerosis noted. Skull: Intact. Sinuses/Orbits: Negative. Other: None. IMPRESSION: No acute abnormality. Atherosclerosis. Electronically Signed   By: Inge Rise M.D.   On: 02/21/2017 13:56    Procedures Procedures (including critical care time)  Medications Ordered in ED Medications - No data to display   Initial Impression / Assessment and Plan / ED Course  I have reviewed the triage vital signs and the nursing notes.  Pertinent labs & imaging results that were available during my care of the patient were reviewed by me and considered in my medical decision making (see chart for details).   patient presents with an atypical constellation symptoms. She presented with some chest pain that's spidered out of her chest as well as tingling in both of her hands and numbness of the right side of face. This included the forehead. Her exam wasn't consistent with a stroke. I spoke with Dr. Leonel Ramsay with neurology who has evaluated the patient. He does not feel the patient needs an MRI. He is concerned that maybe she had an atypical seizure presentation  given that she has a history of petit mal seizures. He requests the patient follow-up with neurology. I have given her name [facility] neurology referral to follow-up within the next week. She will need an EEG at that point. Dr. Leonel Ramsay and I have both advised her that she will not be allowed to drive until she is cleared by neurology. She has no  ongoing chest pain. She didn't have other symptoms that sound more cardiac in nature. Her EKG doesn't show any ischemic changes. Her troponin is negative. Her chest pain has resolved. Her other symptoms have almost completely resolved. Given this I would have a low suspicion for other things such as aortic dissection. She was also encouraged to follow-up with her PCP. Term precautions were given.    Final Clinical Impressions(s) / ED Diagnoses   Final diagnoses:  Atypical chest pain  Paresthesias    New Prescriptions New Prescriptions   No medications on file     Malvin Johns, MD 02/21/17 1442

## 2017-02-21 NOTE — ED Notes (Signed)
Dr. Leonel Ramsay with Neurology is at bedside for consult.

## 2017-02-21 NOTE — ED Triage Notes (Signed)
Pt presents with onset of mid-sternal chest pain while driving that was 86/77 pain, is now 2/10.  Pt reports both hands became numb with R sided facial paralysis, pt reports co-worker told her she was pale; pt reports she knew what she wanted to say but was unable to speak, she reports it sounded jibberish.  On arrival, pt is speaking normal but still has slight droop to R side.

## 2017-02-23 LAB — URINE CULTURE

## 2017-02-24 ENCOUNTER — Telehealth: Payer: Self-pay | Admitting: Emergency Medicine

## 2017-02-24 NOTE — Telephone Encounter (Signed)
Post ED Visit - Positive Culture Follow-up  Culture report reviewed by antimicrobial stewardship pharmacist:  []  Elenor Quinones, Pharm.D. []  Heide Guile, Pharm.D., BCPS AQ-ID []  Parks Neptune, Pharm.D., BCPS []  Alycia Rossetti, Pharm.D., BCPS []  [place], [place].D., BCPS, AAHIVP []  Legrand Como, Pharm.D., BCPS, AAHIVP [x]  Salome Arnt, PharmD, BCPS []  Dimitri Ped, PharmD, BCPS []  Vincenza Hews, PharmD, BCPS  Positive urine culture Treated with none, asymptomatic,  no further patient follow-up is required at this time.  Hazle Nordmann 02/24/2017, 2:30 PM

## 2017-02-26 ENCOUNTER — Ambulatory Visit (INDEPENDENT_AMBULATORY_CARE_PROVIDER_SITE_OTHER): Payer: No Typology Code available for payment source | Admitting: Neurology

## 2017-02-26 ENCOUNTER — Encounter: Payer: Self-pay | Admitting: Neurology

## 2017-02-26 VITALS — BP 137/87 | HR 83 | Ht 61.0 in | Wt 185.0 lb

## 2017-02-26 DIAGNOSIS — R4789 Other speech disturbances: Secondary | ICD-10-CM | POA: Diagnosis not present

## 2017-02-26 NOTE — Patient Instructions (Signed)
   We will check MRI of the brain and get EEG evaluation.
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
## 2017-03-05 ENCOUNTER — Other Ambulatory Visit: Payer: Self-pay | Admitting: Neurology

## 2017-03-13 ENCOUNTER — Ambulatory Visit
Admission: RE | Admit: 2017-03-13 | Discharge: 2017-03-13 | Disposition: A | Discharge status: Home / Self Care | Payer: No Typology Code available for payment source | Source: Ambulatory Visit | Attending: Neurology | Admitting: Neurology

## 2017-03-13 DIAGNOSIS — R4789 Other speech disturbances: Secondary | ICD-10-CM | POA: Diagnosis not present

## 2017-03-13 MED ORDER — GADOBENATE DIMEGLUMINE 529 MG/ML IV SOLN
13.0000 mL | Freq: Once | INTRAVENOUS | Status: AC | PRN
  Administered 2017-03-13: 13 mL via INTRAVENOUS

## 2017-03-14 ENCOUNTER — Telehealth: Payer: Self-pay | Admitting: Neurology

## 2017-03-14 NOTE — Telephone Encounter (Signed)
I called the patient. The MRI the brain shows nonspecific mild white matter changes. The patient claimed that she does have a history of migraine headaches, this could explain these changes. The patient is to be set up for an EEG study. She has not noted any further events of speech alteration.   MRi brain 03/13/17:  IMPRESSION:  This MRI of the brain with and without contrast shows the following: 1.    Scattered T2/FLAIR hyperintense foci in the subcortical and deep white matter of both frontal lobes most consistent with chronic microvascular ischemic change, more than expected for age, or sequela of migraine headaches. When compared to the MRI dated 09/12/2008, there has been some progression. None of the foci are acute. 2.     There are no acute findings and there was a normal enhancement pattern.

## 2017-03-27 ENCOUNTER — Ambulatory Visit (INDEPENDENT_AMBULATORY_CARE_PROVIDER_SITE_OTHER): Payer: No Typology Code available for payment source

## 2017-03-27 ENCOUNTER — Encounter (INDEPENDENT_AMBULATORY_CARE_PROVIDER_SITE_OTHER): Payer: Self-pay

## 2017-03-27 DIAGNOSIS — R4701 Aphasia: Secondary | ICD-10-CM | POA: Diagnosis not present

## 2017-03-27 DIAGNOSIS — R4789 Other speech disturbances: Secondary | ICD-10-CM

## 2017-03-31 ENCOUNTER — Telehealth: Payer: Self-pay | Admitting: Neurology

## 2017-03-31 NOTE — Telephone Encounter (Signed)
I called the patient.  The EEG study was normal. 

## 2017-03-31 NOTE — Procedures (Signed)
    History:  Mary Benson is a 46 year old patient with a history of episodes of tingling in the hands and numbness on the right side of the face associated with inability to talk. The patient last had an event on 02/21/2017. The patient is being evaluated for this episode.  This is a routine EEG. No skull defects are noted. Medications include aspirin, Valium, Bentyl, Motrin, Reglan, multivitamins, oxycodone, Protonix, and potassium supplementation.   EEG classification: Normal awake  Description of the recording: The background rhythms of this recording consists of a fairly well modulated medium amplitude alpha rhythm of 11 Hz that is reactive to eye opening and closure. As the record progresses, the patient appears to remain in the waking state throughout the recording. Photic stimulation was performed, resulting in a bilateral and symmetric photic driving response. Hyperventilation was also performed, resulting in a minimal buildup of the background rhythm activities without significant slowing seen. At no time during the recording does there appear to be evidence of spike or spike wave discharges or evidence of focal slowing. EKG monitor shows no evidence of cardiac rhythm abnormalities with a heart rate of 78.  Impression: This is a normal EEG recording in the waking state. No evidence of ictal or interictal discharges are seen.

## 2017-06-08 ENCOUNTER — Encounter (HOSPITAL_COMMUNITY): Payer: Self-pay | Admitting: Emergency Medicine

## 2017-06-08 ENCOUNTER — Emergency Department (HOSPITAL_COMMUNITY)
Admission: EM | Admit: 2017-06-08 | Discharge: 2017-06-09 | Disposition: A | Discharge status: Home / Self Care | Payer: No Typology Code available for payment source | Attending: Emergency Medicine | Admitting: Emergency Medicine

## 2017-06-08 ENCOUNTER — Emergency Department (HOSPITAL_COMMUNITY): Payer: No Typology Code available for payment source

## 2017-06-08 DIAGNOSIS — F17210 Nicotine dependence, cigarettes, uncomplicated: Secondary | ICD-10-CM | POA: Diagnosis not present

## 2017-06-08 DIAGNOSIS — Z7982 Long term (current) use of aspirin: Secondary | ICD-10-CM | POA: Insufficient documentation

## 2017-06-08 DIAGNOSIS — N739 Female pelvic inflammatory disease, unspecified: Secondary | ICD-10-CM | POA: Diagnosis not present

## 2017-06-08 DIAGNOSIS — Z79899 Other long term (current) drug therapy: Secondary | ICD-10-CM | POA: Insufficient documentation

## 2017-06-08 DIAGNOSIS — Z8541 Personal history of malignant neoplasm of cervix uteri: Secondary | ICD-10-CM | POA: Insufficient documentation

## 2017-06-08 DIAGNOSIS — N730 Acute parametritis and pelvic cellulitis: Secondary | ICD-10-CM

## 2017-06-08 DIAGNOSIS — R1031 Right lower quadrant pain: Secondary | ICD-10-CM | POA: Diagnosis present

## 2017-06-08 LAB — URINALYSIS, ROUTINE W REFLEX MICROSCOPIC
Bilirubin Urine: NEGATIVE
GLUCOSE, UA: NEGATIVE mg/dL
Hgb urine dipstick: NEGATIVE
KETONES UR: NEGATIVE mg/dL
LEUKOCYTES UA: NEGATIVE
Nitrite: NEGATIVE
PROTEIN: NEGATIVE mg/dL
Specific Gravity, Urine: 1.013 (ref 1.005–1.030)
pH: 7 (ref 5.0–8.0)

## 2017-06-08 LAB — COMPREHENSIVE METABOLIC PANEL
ALBUMIN: 3.8 g/dL (ref 3.5–5.0)
ALT: 18 U/L (ref 14–54)
AST: 19 U/L (ref 15–41)
Alkaline Phosphatase: 77 U/L (ref 38–126)
Anion gap: 10 (ref 5–15)
BUN: 16 mg/dL (ref 6–20)
CHLORIDE: 103 mmol/L (ref 101–111)
CO2: 25 mmol/L (ref 22–32)
CREATININE: 0.82 mg/dL (ref 0.44–1.00)
Calcium: 8.9 mg/dL (ref 8.9–10.3)
GFR calc Af Amer: >60 mL/min (ref >60)
GFR calc non Af Amer: >60 mL/min (ref >60)
GLUCOSE: 115 mg/dL — AB (ref 65–99)
POTASSIUM: 3.5 mmol/L (ref 3.5–5.1)
Sodium: 138 mmol/L (ref 135–145)
Total Bilirubin: 0.5 mg/dL (ref 0.3–1.2)
Total Protein: 7.4 g/dL (ref 6.5–8.1)

## 2017-06-08 LAB — CBC
HEMATOCRIT: 41.3 % (ref 36.0–46.0)
Hemoglobin: 14.7 g/dL (ref 12.0–15.0)
MCH: 31 pg (ref 26.0–34.0)
MCHC: 35.6 g/dL (ref 30.0–36.0)
MCV: 87.1 fL (ref 78.0–100.0)
PLATELETS: 346 10*3/uL (ref 150–400)
RBC: 4.74 MIL/uL (ref 3.87–5.11)
RDW: 13.4 % (ref 11.5–15.5)
WBC: 16.2 10*3/uL — AB (ref 4.0–10.5)

## 2017-06-08 LAB — LIPASE, BLOOD: LIPASE: 33 U/L (ref 11–51)

## 2017-06-08 MED ORDER — IOPAMIDOL (ISOVUE-300) INJECTION 61%
INTRAVENOUS | Status: AC
  Filled 2017-06-08: qty 100

## 2017-06-08 MED ORDER — MORPHINE SULFATE (PF) 2 MG/ML IV SOLN
2.0000 mg | Freq: Once | INTRAVENOUS | Status: AC
  Administered 2017-06-08: 2 mg via INTRAVENOUS
  Filled 2017-06-08: qty 1

## 2017-06-08 MED ORDER — SODIUM CHLORIDE 0.9 % IV BOLUS (SEPSIS)
1000.0000 mL | Freq: Once | INTRAVENOUS | Status: AC
  Administered 2017-06-08: 1000 mL via INTRAVENOUS

## 2017-06-08 MED ORDER — ONDANSETRON HCL 4 MG/2ML IJ SOLN
4.0000 mg | Freq: Once | INTRAMUSCULAR | Status: AC
  Administered 2017-06-08: 4 mg via INTRAVENOUS
  Filled 2017-06-08: qty 2

## 2017-06-08 NOTE — ED Provider Notes (Signed)
[facility] DEPT Provider Note   CSN: [id number] Arrival date & time: 06/08/17  2015     History   Chief Complaint Chief Complaint  Patient presents with  . Abdominal Pain    HPI Mary Benson is a 46 y.o. female.  HPI  46 y.o. female, presents to the Emergency Department today due to RLQ pain x 2 days ago. Noted without emesis or diarrhea. Noted fever yesterday. Subjective fevers. Notes extreme TTP on RLQ. Rates pain 10/10. Throbbing and constant in nature. Normals BMs. Pt with hx partial hysterectomy as well as cholecystectomy. Last PO intake around 1900. No CP/SOB. No headaches. No other symptoms noted.   Past Medical History:  Diagnosis Date  . Anxiety   . Cancer (HCC)    cervical, uterine,   . Distal radius fracture, right 02/22/2013  . History of cervical cancer   . Laceration of forehead 02/22/2013   sutures in place  . Seasonal allergies   . Seizures ([place])    last seizure > 1 yr.; seizures caused by head trauma    Patient Active Problem List   Diagnosis Date Noted  . Episodes of speech arrest 02/26/2017  . MDD (major depressive disorder), single episode, severe with psychotic features ([place]) 10/24/2013    Past Surgical History:  Procedure Laterality Date  . ABDOMINAL HYSTERECTOMY     partial  . CESAREAN SECTION  1997  . CHOLECYSTECTOMY    . KNEE ARTHROSCOPY Right 09/2014  . LAPAROSCOPIC LYSIS OF ADHESIONS  01/31/2006  . OPEN REDUCTION INTERNAL FIXATION (ORIF) DISTAL RADIAL FRACTURE Right 02/24/2013   Procedure: OPEN REDUCTION INTERNAL FIXATION (ORIF) RIGHT DISTAL RADIUS FRACTURE;  Surgeon: Jolyn Nap, MD;  Location: [facility];  Service: Orthopedics;  Laterality: Right;    OB History    No data available       Home Medications    Prior to Admission medications   Medication Sig Start Date End Date Taking? Authorizing Provider  aspirin EC 81 MG tablet Take 1 tablet (81 mg total) by mouth daily. Patient taking differently:  Take 81 mg by mouth daily as needed for moderate pain.  05/10/15   Elam Dutch, MD  aspirin-acetaminophen-caffeine (EXCEDRIN MIGRAINE) [phone] MG per tablet Take 2 tablets by mouth every 6 (six) hours as needed for headache or migraine.    [provider]  diazepam (VALIUM) 5 MG tablet Take 1 tablet (5 mg total) by mouth every 8 (eight) hours as needed for muscle spasms. 12/23/16   Virgel Manifold, MD  dicyclomine (BENTYL) 20 MG tablet Take 1 tablet (20 mg total) by mouth 2 (two) times daily. 07/22/16   Delsa Grana, PA-C  Hyoscyamine Sulfate 0.375 MG TBCR One tablet twice a day for 5 days, then as needed, abdominal pain Patient not taking: Reported on 07/21/2016 06/07/15   Inda Castle, MD  ibuprofen (ADVIL,MOTRIN) 200 MG tablet Take 200 mg by mouth every 6 (six) hours as needed for moderate pain.    [provider]  metoCLOPramide (REGLAN) 10 MG tablet Take 1 tablet (10 mg total) by mouth 3 (three) times daily as needed for nausea (headache / nausea). 07/22/16   Delsa Grana, PA-C  Multiple Vitamins-Minerals (WOMENS MULTIVITAMIN PLUS PO) Take 1 tablet by mouth daily.    [provider]  naproxen (NAPROSYN) 375 MG tablet Take 1 tablet (375 mg total) by mouth 2 (two) times daily as needed (pain). 12/23/16   Virgel Manifold, MD  oxyCODONE-acetaminophen (PERCOCET/ROXICET) 5-325 MG tablet Take  1 tablet by mouth every 6 (six) hours as needed for severe pain. 12/23/16   Virgel Manifold, MD  pantoprazole (PROTONIX) 20 MG tablet Take 1 tablet (20 mg total) by mouth daily. 07/22/16   Delsa Grana, PA-C  pantoprazole (PROTONIX) 40 MG tablet Take 1 tablet (40 mg total) by mouth daily. Patient not taking: Reported on 07/21/2016 05/30/15   Hvozdovic, Lori P, PA-C  potassium chloride (K-DUR) 10 MEQ tablet Take 2 tablets (20 mEq total) by mouth 2 (two) times daily. 07/22/16   Delsa Grana, PA-C    Family History Family History  Problem Relation Age of Onset  . Cervical cancer  Mother   . Diabetes Mother   . High blood pressure Mother   . Diabetes Sister   . High blood pressure Sister   . Diabetes Brother   . High blood pressure Brother     Social History Social History  Substance Use Topics  . Smoking status: Current Every Day Smoker    Packs/day: 1.00    Years: 24.00    Types: Cigarettes  . Smokeless tobacco: Never Used  . Alcohol use 0.0 oz/week     Comment: occasionally     Allergies   Hydrocodone and Penicillins   Review of Systems Review of Systems ROS reviewed and all are negative for acute change except as noted in the HPI.  Physical Exam Updated Vital Signs BP 112/76 (BP Location: Right Arm)   Pulse 95   Temp 97.9 F (36.6 C) (Oral)   Resp 18   Ht 5\' 1"  (1.549 m)   Wt 77.1 kg (170 lb)   SpO2 98%   BMI 32.12 kg/m   Physical Exam  Constitutional: She is oriented to person, place, and time. Vital signs are normal. She appears well-developed and well-nourished. No distress.  HENT:  Head: Normocephalic and atraumatic.  Right Ear: Hearing, tympanic membrane, external ear and ear canal normal.  Left Ear: Hearing, tympanic membrane, external ear and ear canal normal.  Nose: Nose normal.  Mouth/Throat: Uvula is midline, oropharynx is clear and moist and mucous membranes are normal. No trismus in the jaw. No oropharyngeal exudate, posterior oropharyngeal erythema or tonsillar abscesses.  Eyes: Pupils are equal, round, and reactive to light. Conjunctivae and EOM are normal.  Neck: Normal range of motion. Neck supple. No tracheal deviation present.  Cardiovascular: Normal rate, regular rhythm, S1 normal, S2 normal, normal heart sounds, intact distal pulses and normal pulses.   Pulmonary/Chest: Effort normal and breath sounds normal. No respiratory distress. She has no decreased breath sounds. She has no wheezes. She has no rhonchi. She has no rales.  Abdominal: Normal appearance and bowel sounds are normal. There is tenderness in the  right lower quadrant. There is rebound, guarding and tenderness at McBurney's point.  Abdomen soft  Musculoskeletal: Normal range of motion.  Neurological: She is alert and oriented to person, place, and time.  Skin: Skin is warm and dry.  Psychiatric: She has a normal mood and affect. Her speech is normal and behavior is normal. Thought content normal.  Nursing note and vitals reviewed.  Exam performed by Ozella Rocks,  exam chaperoned Date: 06/09/2017 Pelvic exam: normal external genitalia without evidence of trauma. VULVA: normal appearing vulva with no masses, tenderness or lesion. VAGINA: normal appearing vagina with normal color and discharge, no lesions. CERVIX: normal appearing cervix without lesions, cervical motion tenderness absent, cervical os closed with out purulent discharge; vaginal discharge - white, Wet prep and DNA probe for  chlamydia and GC obtained.   ADNEXA: normal adnexa in size, mild tenderness on right. No masses UTERUS: uterus is normal size, shape, consistency and nontender.    ED Treatments / Results  Labs (all labs ordered are listed, but only abnormal results are displayed) Labs Reviewed  COMPREHENSIVE METABOLIC PANEL - Abnormal; Notable for the following:       Result Value   Glucose, Bld 115 (*)    All other components within normal limits  CBC - Abnormal; Notable for the following:    WBC 16.2 (*)    All other components within normal limits  WET PREP, GENITAL  LIPASE, BLOOD  URINALYSIS, ROUTINE W REFLEX MICROSCOPIC  GC/CHLAMYDIA PROBE AMP ([place]) NOT AT [facility]    EKG  EKG Interpretation None       Radiology Ct Abdomen Pelvis W Contrast  Result Date: 06/09/2017 CLINICAL DATA:  Right lower quadrant pain for 2 days. Elevated white blood cell count. EXAM: CT ABDOMEN AND PELVIS WITH CONTRAST TECHNIQUE: Multidetector CT imaging of the abdomen and pelvis was performed using the standard protocol following bolus administration of intravenous  contrast. CONTRAST:  166mL ISOVUE-300 IOPAMIDOL (ISOVUE-300) INJECTION 61% COMPARISON:  CT 07/21/2016 FINDINGS: Lower chest: The lung bases are clear. Hepatobiliary: No focal hepatic lesion. Postcholecystectomy with mild intrahepatic biliary prominence, likely sequela of prior cholecystectomy. No extrahepatic biliary ductal dilatation. Pancreas: Truncated with hypoplastic body and tail. No peripancreatic inflammation or ductal dilatation. Spleen: Normal in size without focal abnormality. Splenule at the hilum. Adrenals/Urinary Tract: Normal adrenal glands. No hydronephrosis or perinephric edema. Homogeneous enhancement with symmetric excretion on delayed phase imaging. Urinary bladder is physiologically distended. No bladder wall thickening. Stomach/Bowel: Normal appendix, axial images #60-67. No periappendiceal inflammation. Stomach is nondistended. Proximal small bowel is fluid-filled but nondilated. No wall thickening or perienteric inflammation. Normal terminal ileum. Moderate colonic stool burden in the ascending and transverse colon. Descending and sigmoid colon are decompressed. No colonic inflammation. Vascular/Lymphatic: Aorta bi-iliac atherosclerosis. No abdominal or pelvic adenopathy. Reproductive: Post hysterectomy. Ovaries symmetric in size. No extra ovarian mass. Other: No free air or intra-abdominal ascites. No intra-abdominal abscess. Musculoskeletal: There are no acute or suspicious osseous abnormalities. IMPRESSION: 1. Normal appendix. No acute abnormality in the abdomen/pelvis or finding to explain right lower quadrant pain. 2.  Aortic Atherosclerosis (ICD10-I70.0). Electronically Signed   By: Jeb Levering M.D.   On: 06/09/2017 00:44    Procedures Procedures (including critical care time)  Medications Ordered in ED Medications  iopamidol (ISOVUE-300) 61 % injection (not administered)  cefTRIAXone (ROCEPHIN) injection 250 mg (not administered)  azithromycin (ZITHROMAX) tablet 1,000 mg  (not administered)  sodium chloride 0.9 % bolus 1,000 mL (0 mLs Intravenous Stopped 06/09/17 0022)  morphine 2 MG/ML injection 2 mg (2 mg Intravenous Given 06/08/17 2329)  ondansetron (ZOFRAN) injection 4 mg (4 mg Intravenous Given 06/08/17 2329)  iopamidol (ISOVUE-300) 61 % injection 100 mL (100 mLs Intravenous Contrast Given 06/09/17 0022)     Initial Impression / Assessment and Plan / ED Course  I have reviewed the triage vital signs and the nursing notes.  Pertinent labs & imaging results that were available during my care of the patient were reviewed by me and considered in my medical decision making (see chart for details).  Final Clinical Impressions(s) / ED Diagnoses  {I have reviewed and evaluated the relevant laboratory values. {I have reviewed and evaluated the relevant imaging studies.  {I have reviewed the relevant previous healthcare records.  {I obtained HPI from historian.  ED Course:  Assessment: Pt is a 46 y.o. female presents to the Emergency Department today due to RLQ pain x 2 days ago. Noted without emesis or diarrhea. Noted fever yesterday. Subjective fevers. Notes extreme TTP on RLQ. Rates pain 10/10. Throbbing and constant in nature. Normals BMs. Pt with hx partial hysterectomy as well as cholecystectomy. Last PO intake around 1900. No CP/SOB. No headaches.. On exam, pt in NAD. Nontoxic/nonseptic appearing. VSS. Afebrile. Lungs CTA. Heart RRR. Abdomen TTP RLQ.. McBurneys Pos. CBC with leukocytosis 16. High concern for Appendicitis. CT Abdomen ordered. Given analgesia and fluids in ED.   12:49 AM- CT abdomen/pelvis unremarkable. No appendicitis noted. Discussed with attending physician. Pelvic examination with mild adnexal tenderness on right. Mild discharge. GC and Wet prep obtained. Pt sexually active with one partner. Will treat for PID and strict return precaution given. Pt with partial hysterectomy and without fallopian tubes as well as uterus. Given Rocephin and Azithro  in ED. Will Rx Doxycyline. Strict return precautions given. At time of discharge, Patient is in no acute distress. Vital Signs are stable. Patient is able to ambulate. Patient able to tolerate PO.    Disposition/Plan:  DC Home Additional Verbal discharge instructions given and discussed with patient.  Pt Instructed to f/u with PCP in the next week for evaluation and treatment of symptoms. Return precautions given Pt acknowledges and agrees with plan  Supervising Physician Horton, Barbette Hair, MD  Final diagnoses:  PID (acute pelvic inflammatory disease)    New Prescriptions New Prescriptions   No medications on file     Shary Decamp, Hershal Coria 06/09/17 0112    Merryl Hacker, MD 06/10/17 2253

## 2017-06-08 NOTE — ED Triage Notes (Signed)
Pt comes in with complaints of right lower abdominal pain that started 2 days ago. Endorses nausea without vomiting or diarrhea.  Reports fever yesterday. States pain is running around to her back. Ambulatory and A&O x4.

## 2017-06-09 ENCOUNTER — Emergency Department (HOSPITAL_COMMUNITY): Payer: No Typology Code available for payment source

## 2017-06-09 ENCOUNTER — Encounter (HOSPITAL_COMMUNITY): Payer: Self-pay

## 2017-06-09 LAB — WET PREP, GENITAL
Sperm: NONE SEEN
Trich, Wet Prep: NONE SEEN
YEAST WET PREP: NONE SEEN

## 2017-06-09 MED ORDER — IBUPROFEN 600 MG PO TABS: 600.0000 mg | ORAL_TABLET | Freq: Four times a day (QID) | ORAL | 0 refills | Status: DC | PRN

## 2017-06-09 MED ORDER — IOPAMIDOL (ISOVUE-300) INJECTION 61%
100.0000 mL | Freq: Once | INTRAVENOUS | Status: AC | PRN
  Administered 2017-06-09: 100 mL via INTRAVENOUS

## 2017-06-09 MED ORDER — AZITHROMYCIN 250 MG PO TABS
1000.0000 mg | ORAL_TABLET | Freq: Once | ORAL | Status: AC
  Administered 2017-06-09: 1000 mg via ORAL
  Filled 2017-06-09: qty 4

## 2017-06-09 MED ORDER — DOXYCYCLINE HYCLATE 100 MG PO CAPS: 100.0000 mg | ORAL_CAPSULE | Freq: Two times a day (BID) | ORAL | 0 refills | Status: AC

## 2017-06-09 MED ORDER — CEFTRIAXONE SODIUM 250 MG IJ SOLR
250.0000 mg | Freq: Once | INTRAMUSCULAR | Status: AC
  Administered 2017-06-09: 250 mg via INTRAMUSCULAR
  Filled 2017-06-09: qty 250
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
## 2017-06-10 LAB — GC/CHLAMYDIA PROBE AMP (~~LOC~~) NOT AT ARMC
Chlamydia: NEGATIVE
Neisseria Gonorrhea: NEGATIVE

## 2017-06-30 DIAGNOSIS — E041 Nontoxic single thyroid nodule: Secondary | ICD-10-CM | POA: Insufficient documentation

## 2017-08-14 DIAGNOSIS — E782 Mixed hyperlipidemia: Secondary | ICD-10-CM | POA: Insufficient documentation

## 2017-12-28 ENCOUNTER — Emergency Department (HOSPITAL_COMMUNITY)
Admission: EM | Admit: 2017-12-28 | Discharge: 2017-12-28 | Disposition: A | Discharge status: Home / Self Care | Payer: No Typology Code available for payment source

## 2017-12-28 ENCOUNTER — Emergency Department (HOSPITAL_COMMUNITY)
Admission: EM | Admit: 2017-12-28 | Discharge: 2017-12-28 | Discharge status: Against Medical Advice | Payer: No Typology Code available for payment source
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
## 2018-04-06 DIAGNOSIS — F419 Anxiety disorder, unspecified: Secondary | ICD-10-CM | POA: Insufficient documentation

## 2018-11-09 DIAGNOSIS — Z720 Tobacco use: Secondary | ICD-10-CM | POA: Insufficient documentation

## 2019-01-27 ENCOUNTER — Encounter: Payer: Self-pay | Admitting: Cardiology

## 2019-01-27 ENCOUNTER — Other Ambulatory Visit: Payer: Self-pay

## 2019-01-27 ENCOUNTER — Ambulatory Visit (INDEPENDENT_AMBULATORY_CARE_PROVIDER_SITE_OTHER): Payer: No Typology Code available for payment source | Admitting: Cardiology

## 2019-01-27 VITALS — BP 118/92 | HR 96 | Ht 61.0 in | Wt 168.0 lb

## 2019-01-27 DIAGNOSIS — R002 Palpitations: Secondary | ICD-10-CM

## 2019-01-27 DIAGNOSIS — G4733 Obstructive sleep apnea (adult) (pediatric): Secondary | ICD-10-CM

## 2019-01-27 DIAGNOSIS — R0789 Other chest pain: Secondary | ICD-10-CM | POA: Diagnosis not present

## 2019-01-27 DIAGNOSIS — R42 Dizziness and giddiness: Secondary | ICD-10-CM | POA: Insufficient documentation

## 2019-01-27 DIAGNOSIS — R5381 Other malaise: Secondary | ICD-10-CM

## 2019-01-27 DIAGNOSIS — Z8673 Personal history of transient ischemic attack (TIA), and cerebral infarction without residual deficits: Secondary | ICD-10-CM

## 2019-01-27 DIAGNOSIS — R5383 Other fatigue: Secondary | ICD-10-CM

## 2019-01-27 DIAGNOSIS — R0609 Other forms of dyspnea: Secondary | ICD-10-CM | POA: Diagnosis not present

## 2019-01-27 DIAGNOSIS — R079 Chest pain, unspecified: Secondary | ICD-10-CM

## 2019-01-27 DIAGNOSIS — R55 Syncope and collapse: Secondary | ICD-10-CM

## 2019-01-27 NOTE — Progress Notes (Signed)
Virtual Visit via Video Note: This visit type was conducted due to national recommendations for restrictions regarding the COVID-19 Pandemic (e.g. social distancing).  This format is felt to be most appropriate for this patient at this time.  All issues noted in this document were discussed and addressed.  No physical exam was performed (except for noted visual exam findings with Telehealth visits).  The patient has consented to conduct a Telehealth visit and understands insurance will be billed.   I connected with@, on 01/27/19 at  by a video enabled telemedicine application and verified that I am speaking with the correct person using two identifiers.   I discussed the limitations of evaluation and management by telemedicine and the availability of in person appointments. The patient expressed understanding and agreed to proceed.   I have discussed with patient regarding the safety during COVID Pandemic and steps and precautions to be taken including social distancing, frequent hand wash and use of detergent soap, gels with the patient. I asked the patient to avoid touching mouth, nose, eyes, ears with the hands. I encouraged regular walking around the neighborhood and exercise and regular diet, as long as social distancing can be maintained.   Primary Physician/Referring:  Aura Dials, PA-C  Patient ID: Mary Benson, female    DOB: 11-Oct-1970, 48 y.o.   MRN: [id number]  Chief Complaint  Patient presents with  . Acute Visit  . Fatigue  . Chest Pain    HPI: Mary Benson  is a 48 y.o. female  with Patient presented to the emergency room on 01/22/2019 with numbness in her shoulders radiating to her arms, fatigue and not feeling well.  She also complained of sense of loss of taste.  She also has noticed chest tightness/pressure with pain in her shoulders that started 2 weeks ago and is getting worse. She states she had 4 episodes yesterday and occurs both at rest and with  exertion. It is associated with dyspnea. Chest pain is also described as worsening when she takes a deep breath or she moves a certain way and she has to lay very still and has to be calm and has to take shallow breaths for the symptoms to improve.  She has had prior history of seizure disorder but has been seizure-free since 2012.  EKG was normal, CT scan of the head and cervical spine was normal and she was discharged home.    Patient's past medical history is significant for migraine with aura and now controlled with topamax and has 2-3 episodes a week. Symtoms have improved over years, but still present.  She has hypercholesterolemia, hyperglycemia, mild obesity, mild obstructive sleep apnea by sleep evaluation in February 2020 and unable to tolerate CPAP and also restless leg syndrome, prior history of stroke in August 2018, when she presented with speech difficulty and left sided weakness and resolved in a few hours.  She also has tobacco use disorder, smokes a pack or less a day.    Along with associated symptoms of chest pain and dyspnea, she also started noticing episodes of sudden onset rapid palpitations, she is now having 3-4 episodes a day.  Along the last, last 47 minutes each episode.  Sudden onset and offset.  She was seen by her PCP yesterday and she also complained of near syncopal spell and palpitations that occurred on 4/24 and 4/27 and felt hot and sweaty prior to the episode and hence a stat consultation was made for further evaluation.  Past Medical History:  Diagnosis Date  . Anxiety   . Cancer (HCC)    cervical, uterine,   . Distal radius fracture, right 02/22/2013  . History of cervical cancer   . Laceration of forehead 02/22/2013   sutures in place  . Seasonal allergies   . Seizures ([facility])    last seizure > 1 yr.; seizures caused by head trauma    Past Surgical History:  Procedure Laterality Date  . ABDOMINAL HYSTERECTOMY     partial  . CESAREAN SECTION  1997   . CHOLECYSTECTOMY    . KNEE ARTHROSCOPY Right 09/2014  . LAPAROSCOPIC LYSIS OF ADHESIONS  01/31/2006  . OPEN REDUCTION INTERNAL FIXATION (ORIF) DISTAL RADIAL FRACTURE Right 02/24/2013   Procedure: OPEN REDUCTION INTERNAL FIXATION (ORIF) RIGHT DISTAL RADIUS FRACTURE;  Surgeon: Jolyn Nap, MD;  Location: [facility];  Service: Orthopedics;  Laterality: Right;    Social History   Socioeconomic History  . Marital status: Single    Spouse name: Not on file  . Number of children: 1  . Years of education: 36  . Highest education level: Not on file  Occupational History  . Occupation: Chief Financial Officer  Social Needs  . Financial resource strain: Not on file  . Food insecurity:    Worry: Not on file    Inability: Not on file  . Transportation needs:    Medical: Not on file    Non-medical: Not on file  Tobacco Use  . Smoking status: Current Every Day Smoker    Packs/day: 1.00    Years: 24.00    Pack years: 24.00    Types: Cigarettes  . Smokeless tobacco: Never Used  Substance and Sexual Activity  . Alcohol use: Yes    Alcohol/week: 0.0 standard drinks    Comment: occasionally  . Drug use: No  . Sexual activity: Not on file  Lifestyle  . Physical activity:    Days per week: Not on file    Minutes per session: Not on file  . Stress: Not on file  Relationships  . Social connections:    Talks on phone: Not on file    Gets together: Not on file    Attends religious service: Not on file    Active member of club or organization: Not on file    Attends meetings of clubs or organizations: Not on file    Relationship status: Not on file  . Intimate partner violence:    Fear of current or ex partner: Not on file    Emotionally abused: Not on file    Physically abused: Not on file    Forced sexual activity: Not on file  Other Topics Concern  . Not on file  Social History Narrative   Lives with fiance, [name]   Caffeine use: 2 cups coffee per day   1-2 Mt dew per  day   Right handed    Current Outpatient Medications on File Prior to Visit  Medication Sig Dispense Refill  . aspirin EC 81 MG tablet Take 1 tablet (81 mg total) by mouth daily. (Patient taking differently: Take 81 mg by mouth daily as needed for moderate pain. ) 150 tablet 2  . aspirin-acetaminophen-caffeine (EXCEDRIN MIGRAINE) 250-250-65 MG per tablet Take 2 tablets by mouth every 6 (six) hours as needed for headache or migraine.    . cetirizine (ZYRTEC) 10 MG tablet Take by mouth daily.    Marland Kitchen dicyclomine (BENTYL) 20 MG tablet Take 1 tablet (20 mg  total) by mouth 2 (two) times daily. 20 tablet 0  . gabapentin (NEURONTIN) 100 MG capsule TAKE 1 TO 3 CAPSULES BY MOUTH 1 HOUR PRIOR TO BEDTIME    . Multiple Vitamins-Minerals (WOMENS MULTIVITAMIN PLUS PO) Take 1 tablet by mouth daily.    . rosuvastatin (CRESTOR) 10 MG tablet Take by mouth daily.    . sertraline (ZOLOFT) 25 MG tablet TAKE 2 TABLETS BY MOUTH EVERY DAY    . topiramate (TOPAMAX) 25 MG tablet 2 (two) times a day.     No current facility-administered medications on file prior to visit.     Review of Systems  Constitution: Positive for malaise/fatigue.  Cardiovascular: Positive for chest pain and dyspnea on exertion. Negative for paroxysmal nocturnal dyspnea and syncope.  Respiratory: Positive for sleep disturbances due to breathing (mild sleep apnea and restless leg. Not on CPAP). Negative for cough.   Skin: Negative for unusual hair distribution.  Psychiatric/Behavioral: Positive for depression. The patient is nervous/anxious.       Objective  Blood pressure (!) 118/92, pulse 96, height _0  (1.549 m), weight 168 lb (76.2 kg). Body mass index is 31.74 kg/m.   Physical exam not performed or limited due to virtual visit. Limited exam Physical Exam  Constitutional: She is oriented to person, place, and time. She appears well-developed and well-nourished.  Eyes: Conjunctivae are normal.  Neck: Neck supple.  Pulmonary/Chest:  Effort normal.  Neurological: She is alert and oriented to person, place, and time.  Psychiatric: She has a normal mood and affect.   Radiology: No results found.  Laboratory examination:   Labs 01/22/2019: CBC normal, serum glucose 160 mg, nonfasting.  BUN 21, creatinine 0.92, eGFR greater than 61, CMP normal.  Lipid profile 04/07/2018: Total cholesterol 141, triglycerides 149, HDL 37, LDL 74.  TSH normal.  CMP Latest Ref Rng & Units 06/08/2017 02/21/2017 02/21/2017  Glucose 65 - 99 mg/dL 115(H) 98 101(H)  BUN 6 - 20 mg/dL _1 Creatinine 0.44 - 1.00 mg/dL 0.82 0.90 0.88  Sodium 135 - 145 mmol/L 138 139 137  Potassium 3.5 - 5.1 mmol/L 3.5 3.6 3.6  Chloride 101 - 111 mmol/L 103 105 105  CO2 22 - 32 mmol/L 25 - 22  Calcium 8.9 - 10.3 mg/dL 8.9 - 9.2  Total Protein 6.5 - 8.1 g/dL 7.4 - 7.3  Total Bilirubin 0.3 - 1.2 mg/dL 0.5 - 0.5  Alkaline Phos 38 - 126 U/L 77 - 80  AST 15 - 41 U/L 19 - 27  ALT 14 - 54 U/L 18 - 28   CBC Latest Ref Rng & Units 06/08/2017 02/21/2017 02/21/2017  WBC 4.0 - 10.5 K/uL 16.2(H) - 12.6(H)  Hemoglobin 12.0 - 15.0 g/dL 14.7 15.0 15.3(H)  Hematocrit 36.0 - 46.0 % 41.3 44.0 42.9  Platelets 150 - 400 K/uL 346 - 343   Cardiac Studies:   None  Assessment   Chest pain of uncertain etiology  Dyspnea on exertion  Palpitations  Malaise and fatigue  Near syncope  Mild obstructive sleep apnea  H/O: CVA (cerebrovascular accident) no residual deficit 2018  01/22/2019  ED visit normal by Care everywhere report   EKG 01/22/2017: Normal sinus rhythm, no evidence of ischemia, normal EKG.  Recommendations:   Patient with multiple and complex complaints with atypical chest pain which is occurring in the retrosternal area with exertion and also noted during rest, she does her multiple cardiovascular risk factors that includes hyperglycemia, Hyperlipidemia and tobacco use disorder.  She has  also had prior stroke in 2018.  In view of about risk factors I'll  set up for a exercise nuclear stress test along with an echocardiogram.  She is also noticed palpitations that started 2 weeks ago with rapid onset and offset, 2-3 episodes a day, I'll perform an Holter monitor For 48 hours. Her blood pressure is elevated today however her blood pressure both PCPs office and also in the ED visit was within normal limits.  I have extensively discussed with her regarding smoking cessation especially in view of prior stroke.  It will be interesting to know that patient with migraine with aura and prior stroke whether she has a PFO.  Her malaise and fatigue could be multifactorial that includes restless leg, mild obstructive sleep apnea that is not treated, depression that is contributing, along with new onset of symptoms as discussed above. I will see her back in the clinic after the test.  I have reviewed her clinical records from the PCP, ER visits and also prior records and updated them.  Her labs were reviewed. In view of ongoing risk factors and also ongoing chest discomfort and dyspnea that is relatively new onset, will go ahead and schedule the test for now.  She is aware of the Covid-19 issues.   Adrian Prows, MD, [facility] 01/27/2019, 12:25 PM [facility]. [place] Pager: [phone] Office: [phone] If no answer Cell [phone]

## 2019-02-01 ENCOUNTER — Ambulatory Visit: Payer: No Typology Code available for payment source

## 2019-02-01 ENCOUNTER — Other Ambulatory Visit: Payer: Self-pay

## 2019-02-01 DIAGNOSIS — R55 Syncope and collapse: Secondary | ICD-10-CM

## 2019-02-01 DIAGNOSIS — R002 Palpitations: Secondary | ICD-10-CM

## 2019-02-03 DIAGNOSIS — R002 Palpitations: Secondary | ICD-10-CM

## 2019-02-08 ENCOUNTER — Other Ambulatory Visit: Payer: No Typology Code available for payment source

## 2019-02-08 ENCOUNTER — Telehealth: Payer: Self-pay | Admitting: Cardiology

## 2019-02-08 NOTE — Telephone Encounter (Signed)
We can appeal

## 2019-02-08 NOTE — Telephone Encounter (Signed)
Spoke with patient regarding insurance not approving the nuc stress test.  I told her you would review her chart and decide what other test, if any, you want her to have.  She is scheduled for an echo on 02/18/19 & then a f/u appt the 1st week in June

## 2019-02-12 ENCOUNTER — Ambulatory Visit (INDEPENDENT_AMBULATORY_CARE_PROVIDER_SITE_OTHER): Payer: No Typology Code available for payment source

## 2019-02-12 DIAGNOSIS — R0609 Other forms of dyspnea: Secondary | ICD-10-CM | POA: Diagnosis not present

## 2019-02-12 DIAGNOSIS — Z8673 Personal history of transient ischemic attack (TIA), and cerebral infarction without residual deficits: Secondary | ICD-10-CM | POA: Diagnosis not present

## 2019-02-12 DIAGNOSIS — R0789 Other chest pain: Secondary | ICD-10-CM

## 2019-02-12 DIAGNOSIS — R079 Chest pain, unspecified: Secondary | ICD-10-CM

## 2019-02-15 NOTE — Progress Notes (Signed)
S/w pt advised her of normal echo

## 2019-02-18 ENCOUNTER — Other Ambulatory Visit: Payer: Self-pay | Admitting: Cardiology

## 2019-02-18 ENCOUNTER — Other Ambulatory Visit: Payer: No Typology Code available for payment source

## 2019-02-18 DIAGNOSIS — R0609 Other forms of dyspnea: Secondary | ICD-10-CM

## 2019-02-18 DIAGNOSIS — R079 Chest pain, unspecified: Secondary | ICD-10-CM

## 2019-03-01 ENCOUNTER — Telehealth (HOSPITAL_COMMUNITY): Payer: Self-pay | Admitting: Emergency Medicine

## 2019-03-01 NOTE — Telephone Encounter (Signed)
Unable to leave voicemail as mailbox was full  Marchia Bond RN [facility] [phone] Office  [phone] Cell

## 2019-03-02 ENCOUNTER — Ambulatory Visit (HOSPITAL_COMMUNITY)
Admission: RE | Admit: 2019-03-02 | Discharge: 2019-03-02 | Disposition: A | Discharge status: Home / Self Care | Payer: No Typology Code available for payment source | Source: Ambulatory Visit | Attending: Cardiology | Admitting: Cardiology

## 2019-03-02 ENCOUNTER — Other Ambulatory Visit: Payer: Self-pay

## 2019-03-02 ENCOUNTER — Ambulatory Visit (HOSPITAL_COMMUNITY): Payer: No Typology Code available for payment source

## 2019-03-02 DIAGNOSIS — R0789 Other chest pain: Secondary | ICD-10-CM | POA: Insufficient documentation

## 2019-03-02 DIAGNOSIS — R079 Chest pain, unspecified: Secondary | ICD-10-CM

## 2019-03-02 DIAGNOSIS — R0609 Other forms of dyspnea: Secondary | ICD-10-CM | POA: Diagnosis not present

## 2019-03-02 DIAGNOSIS — Z006 Encounter for examination for normal comparison and control in clinical research program: Secondary | ICD-10-CM

## 2019-03-02 MED ORDER — METOPROLOL TARTRATE 5 MG/5ML IV SOLN
INTRAVENOUS | Status: AC
  Administered 2019-03-02: 5 mg
  Filled 2019-03-02: qty 20

## 2019-03-02 MED ORDER — NITROGLYCERIN 0.4 MG SL SUBL
SUBLINGUAL_TABLET | SUBLINGUAL | Status: AC
  Filled 2019-03-02: qty 2

## 2019-03-02 MED ORDER — METOPROLOL TARTRATE 5 MG/5ML IV SOLN: 5.0000 mg | INTRAVENOUS | Status: DC | PRN

## 2019-03-02 MED ORDER — NITROGLYCERIN 0.4 MG SL SUBL
0.8000 mg | SUBLINGUAL_TABLET | SUBLINGUAL | Status: DC | PRN
  Administered 2019-03-02: 0.8 mg via SUBLINGUAL

## 2019-03-02 MED ORDER — IOHEXOL 350 MG/ML SOLN
80.0000 mL | Freq: Once | INTRAVENOUS | Status: AC | PRN
  Administered 2019-03-02: 80 mL via INTRAVENOUS

## 2019-03-02 NOTE — Research (Signed)
Mary Benson met inclusion and exclusion criteria.  The informed consent form, study requirements and expectations were reviewed with the subject and questions and concerns were addressed prior to the signing of the consent form.  The subject verbalized understanding of the trial requirements.  The subject agreed to participate in the [email or web address] trial and signed the informed consent.  The informed consent was obtained prior to performance of any protocol-specific procedures for the subject.  A copy of the signed informed consent was given to the subject and a copy was placed in the subject's medical record.   [name]. [employer]

## 2019-03-04 ENCOUNTER — Other Ambulatory Visit: Payer: Self-pay

## 2019-03-04 ENCOUNTER — Encounter: Payer: Self-pay | Admitting: Cardiology

## 2019-03-04 ENCOUNTER — Ambulatory Visit (INDEPENDENT_AMBULATORY_CARE_PROVIDER_SITE_OTHER): Payer: No Typology Code available for payment source | Admitting: Cardiology

## 2019-03-04 VITALS — Ht 61.0 in | Wt 155.0 lb

## 2019-03-04 DIAGNOSIS — R0789 Other chest pain: Secondary | ICD-10-CM

## 2019-03-04 DIAGNOSIS — E785 Hyperlipidemia, unspecified: Secondary | ICD-10-CM | POA: Diagnosis not present

## 2019-03-04 DIAGNOSIS — Z720 Tobacco use: Secondary | ICD-10-CM

## 2019-03-04 DIAGNOSIS — R0609 Other forms of dyspnea: Secondary | ICD-10-CM

## 2019-03-04 DIAGNOSIS — Z8673 Personal history of transient ischemic attack (TIA), and cerebral infarction without residual deficits: Secondary | ICD-10-CM

## 2019-03-04 NOTE — Progress Notes (Signed)
Virtual Visit via Video Note: This visit type was conducted due to national recommendations for restrictions regarding the COVID-19 Pandemic (e.g. social distancing).  This format is felt to be most appropriate for this patient at this time.  All issues noted in this document were discussed and addressed.  No physical exam was performed (except for noted visual exam findings with Telehealth visits).  The patient has consented to conduct a Telehealth visit and understands insurance will be billed.   I connected with@, on 03/04/19 at  by a video enabled telemedicine application and verified that I am speaking with the correct person using two identifiers.   I discussed the limitations of evaluation and management by telemedicine and the availability of in person appointments. The patient expressed understanding and agreed to proceed.   I have discussed with patient regarding the safety during COVID Pandemic and steps and precautions to be taken including social distancing, frequent hand wash and use of detergent soap, gels with the patient. I asked the patient to avoid touching mouth, nose, eyes, ears with the hands. I encouraged regular walking around the neighborhood and exercise and regular diet, as long as social distancing can be maintained.   Primary Physician/Referring:  Aura Dials, PA-C  Patient ID: Mary Benson, female    DOB: [birth year], 48 y.o.   MRN: [id number]  Chief Complaint  Patient presents with   Chest Pain    test results    HPI: Mary Benson  is a 48 y.o. female  with Patient presented to the emergency room on 01/22/2019 with numbness in her shoulders radiating to her arms, fatigue and not feeling well.  She also complained of sense of loss of taste.  She also has noticed chest tightness/pressure with pain in her shoulders that started 2 weeks ago and is getting worse. She states she had 4 episodes yesterday and occurs both at rest and with exertion. It is  associated with dyspnea. Chest pain is also described as worsening when she takes a deep breath or she moves a certain way and she has to lay very still and has to be calm and has to take shallow breaths for the symptoms to improve.  She has had prior history of seizure disorder but has been seizure-free since 2012.  EKG was normal, CT scan of the head and cervical spine was normal and she was discharged home.    Patient's past medical history is significant for migraine with aura and now controlled with topamax and has 2-3 episodes a week. Symtoms have improved over years, but still present.  She has hypercholesterolemia, hyperglycemia, mild obesity, mild obstructive sleep apnea by sleep evaluation in February 2020 and unable to tolerate CPAP and also restless leg syndrome, prior history of stroke in August 2018, when she presented with speech difficulty and left sided weakness and resolved in a few hours.  She also has tobacco use disorder, smokes a pack or less a day.  She was seen by me about 5 to 6 weeks ago for chest pain, dyspnea, she underwent echocardiogram, Holter monitor for palpitations and coronary CTA and presents for follow-up.  States that she is symptomatically better and has not had any further chest pain.  Continues to have mild dyspnea and attributes this to smoking.  Past Medical History:  Diagnosis Date   Anxiety    Cancer ([place])    cervical, uterine,    Distal radius fracture, right 02/22/2013   History of cervical cancer  Laceration of forehead 02/22/2013   sutures in place   Seasonal allergies    Seizures ([place])    last seizure > 1 yr.; seizures caused by head trauma    Past Surgical History:  Procedure Laterality Date   ABDOMINAL HYSTERECTOMY     partial   CESAREAN SECTION  1997   CHOLECYSTECTOMY     KNEE ARTHROSCOPY Right 09/2014   LAPAROSCOPIC LYSIS OF ADHESIONS  01/31/2006   OPEN REDUCTION INTERNAL FIXATION (ORIF) DISTAL RADIAL FRACTURE Right  02/24/2013   Procedure: OPEN REDUCTION INTERNAL FIXATION (ORIF) RIGHT DISTAL RADIUS FRACTURE;  Surgeon: Jolyn Nap, MD;  Location: [facility];  Service: Orthopedics;  Laterality: Right;    Social History   Socioeconomic History   Marital status: Single    Spouse name: Not on file   Number of children: 1   Years of education: 12   Highest education level: Not on file  Occupational History   Occupation: Secretary/administrator [employer]  Social Needs   Financial resource strain: Not on file   Food insecurity:    Worry: Not on file    Inability: Not on file   Transportation needs:    Medical: Not on file    Non-medical: Not on file  Tobacco Use   Smoking status: Current Every Day Smoker    Packs/day: 1.00    Years: 24.00    Pack years: 24.00    Types: Cigarettes   Smokeless tobacco: Never Used  Substance and Sexual Activity   Alcohol use: Yes    Alcohol/week: 0.0 standard drinks    Comment: occasionally   Drug use: No   Sexual activity: Not on file  Lifestyle   Physical activity:    Days per week: Not on file    Minutes per session: Not on file   Stress: Not on file  Relationships   Social connections:    Talks on phone: Not on file    Gets together: Not on file    Attends religious service: Not on file    Active member of club or organization: Not on file    Attends meetings of clubs or organizations: Not on file    Relationship status: Not on file   Intimate partner violence:    Fear of current or ex partner: Not on file    Emotionally abused: Not on file    Physically abused: Not on file    Forced sexual activity: Not on file  Other Topics Concern   Not on file  Social History Narrative   Lives with fiance, [name]   Caffeine use: 2 cups coffee per day   1-2 Mt dew per day   Right handed    Current Outpatient Medications on File Prior to Visit  Medication Sig Dispense Refill   aspirin EC 81 MG tablet Take 1 tablet (81 mg total) by  mouth daily. (Patient taking differently: Take 81 mg by mouth daily as needed for moderate pain. ) 150 tablet 2   cetirizine (ZYRTEC) 10 MG tablet Take by mouth daily.     gabapentin (NEURONTIN) 100 MG capsule TAKE 1 TO 3 CAPSULES BY MOUTH 1 HOUR PRIOR TO BEDTIME     Multiple Vitamins-Minerals (WOMENS MULTIVITAMIN PLUS PO) Take 1 tablet by mouth daily.     rosuvastatin (CRESTOR) 10 MG tablet Take by mouth daily.     sertraline (ZOLOFT) 25 MG tablet TAKE 2 TABLETS BY MOUTH EVERY DAY     topiramate (TOPAMAX) 25 MG tablet 2 (  two) times a day.     No current facility-administered medications on file prior to visit.     Review of Systems  Constitution: Positive for malaise/fatigue. Negative for chills, decreased appetite and weight gain.  Cardiovascular: Positive for chest pain and dyspnea on exertion. Negative for leg swelling, paroxysmal nocturnal dyspnea and syncope.  Respiratory: Positive for sleep disturbances due to breathing (mild sleep apnea and restless leg. Not on CPAP). Negative for cough.   Endocrine: Negative for cold intolerance.  Hematologic/Lymphatic: Does not bruise/bleed easily.  Skin: Negative for unusual hair distribution.  Musculoskeletal: Negative for joint swelling.  Gastrointestinal: Negative for abdominal pain, anorexia, change in bowel habit, hematochezia and melena.  Neurological: Negative for headaches and light-headedness.  Psychiatric/Behavioral: Positive for depression. Negative for substance abuse. The patient is nervous/anxious.   All other systems reviewed and are negative.     Objective  Height _0  (1.549 m), weight 155 lb (70.3 kg). Body mass index is 29.29 kg/m.   Physical exam not performed or limited due to virtual visit. Limited exam Physical Exam  Constitutional: She is oriented to person, place, and time. She appears well-developed and well-nourished.  Eyes: Conjunctivae are normal.  Neck: Neck supple.  Pulmonary/Chest: Effort normal.    Neurological: She is alert and oriented to person, place, and time.  Psychiatric: She has a normal mood and affect.   Laboratory examination:   Labs 01/22/2019: CBC normal, serum glucose 160 mg, nonfasting.  BUN 21, creatinine 0.92, eGFR greater than 61, CMP normal.  Lipid profile 04/07/2018: Total cholesterol 141, triglycerides 149, HDL 37, LDL 74.  TSH normal.  CMP Latest Ref Rng & Units 06/08/2017 02/21/2017 02/21/2017  Glucose 65 - 99 mg/dL 115(H) 98 101(H)  BUN 6 - 20 mg/dL _1 Creatinine 0.44 - 1.00 mg/dL 0.82 0.90 0.88  Sodium 135 - 145 mmol/L 138 139 137  Potassium 3.5 - 5.1 mmol/L 3.5 3.6 3.6  Chloride 101 - 111 mmol/L 103 105 105  CO2 22 - 32 mmol/L 25 - 22  Calcium 8.9 - 10.3 mg/dL 8.9 - 9.2  Total Protein 6.5 - 8.1 g/dL 7.4 - 7.3  Total Bilirubin 0.3 - 1.2 mg/dL 0.5 - 0.5  Alkaline Phos 38 - 126 U/L 77 - 80  AST 15 - 41 U/L 19 - 27  ALT 14 - 54 U/L 18 - 28   CBC Latest Ref Rng & Units 06/08/2017 02/21/2017 02/21/2017  WBC 4.0 - 10.5 K/uL 16.2(H) - 12.6(H)  Hemoglobin 12.0 - 15.0 g/dL 14.7 15.0 15.3(H)  Hematocrit 36.0 - 46.0 % 41.3 44.0 42.9  Platelets 150 - 400 K/uL 346 - 343   Cardiac Studies:   Holter Monitor for 48 hours  02/01/2019: No symptoms reported.  There are occasional PACs.  Uneventful Holter monitor.  Coronary CTA 03/02/2019: Non-cardiac:  Normal. Pulmonary veins drain normally to the left atrium. Calcium Score: 35 Agatston units. Coronary Arteries: Right dominant with no anomalies  LM: No plaque or stenosis. LAD system: Mixed plaque proximal LAD, mild (<50%) stenosis. Circumflex system: Calcified plaque proximal LCx, no significant stenosis. RCA system: No plaque or stenosis.  IMPRESSION: 1. Coronary artery calcium score 35 Agatston units. This places the patient in the 98th percentile for age and gender, suggesting high risk for future cardiac events. 2.  Mild stenosis in the proximal LAD.  Echocardiogram 02/12/2019: Normal LV  systolic function with EF 55%. Left ventricle cavity is normal in size. Mild concentric hypertrophy of the left ventricle. Normal global  wall motion. Doppler evidence of grade II (pseudonormal) diastolic dysfunction, elevated LAP. Calculated EF 55%. Trace aortic regurgitation. Mild tricuspid regurgitation. Estimated pulmonary artery systolic pressure 26 mmHg. IVC is dilated with respiratory variation. Estimated RA pressure 8 mmHg.  Assessment   Non-cardiac chest pain  Mild hyperlipidemia  Dyspnea on exertion  H/O: CVA (cerebrovascular accident) no residual deficit 2018  Tobacco use  01/22/2019  ED visit normal by Care everywhere report   EKG 01/22/2017: Normal sinus rhythm, no evidence of ischemia, normal EKG.  Recommendations:   Patient with multiple and complex complaints with atypical chest pain which is occurring in the retrosternal area with exertion and also noted during rest, she does her multiple cardiovascular risk factors that includes hyperglycemia, Hyperlipidemia and tobacco use disorder.  She has also had prior stroke in 2018.  I have reviewed the results of the Holter monitor, echocardiogram and notes coronary CTA that was performed recently, and simply reassured her.  She does have LAD plaque her coronary calcium score is relatively low however because of her age would place her at high risk for future events.  Aggressive risk factor modification including control of lipids is indicated, I reviewed her labs, lipids are under excellent control.  Smoking cessation is a must, she appears to be motivated.  Her chest pain symptoms do not appear to be from cardiac etiology and have simply reassured her.  She is willing to start exercise program.  Dyspnea is probably related to underlying COPD.  I will see her back on a as needed basis.  Adrian Prows, MD, [facility] 03/04/2019, 11:29 AM [facility]. Start Pager: [phone] Office: [phone] If no answer Cell [phone]

## 2019-05-21 ENCOUNTER — Other Ambulatory Visit: Payer: Self-pay | Admitting: Physician Assistant

## 2019-05-21 DIAGNOSIS — R928 Other abnormal and inconclusive findings on diagnostic imaging of breast: Secondary | ICD-10-CM

## 2019-06-01 ENCOUNTER — Encounter (HOSPITAL_COMMUNITY): Payer: Self-pay | Admitting: *Deleted

## 2019-06-01 ENCOUNTER — Emergency Department (HOSPITAL_COMMUNITY): Payer: No Typology Code available for payment source

## 2019-06-01 ENCOUNTER — Emergency Department (HOSPITAL_COMMUNITY)
Admission: EM | Admit: 2019-06-01 | Discharge: 2019-06-01 | Discharge status: Against Medical Advice | Payer: No Typology Code available for payment source | Attending: Emergency Medicine | Admitting: Emergency Medicine

## 2019-06-01 DIAGNOSIS — Z5321 Procedure and treatment not carried out due to patient leaving prior to being seen by health care provider: Secondary | ICD-10-CM | POA: Diagnosis not present

## 2019-06-01 DIAGNOSIS — R0789 Other chest pain: Secondary | ICD-10-CM | POA: Diagnosis present

## 2019-06-01 LAB — I-STAT BETA HCG BLOOD, ED (MC, WL, AP ONLY): I-stat hCG, quantitative: 5.8 m[IU]/mL — ABNORMAL HIGH (ref <5)

## 2019-06-01 LAB — BASIC METABOLIC PANEL
Anion gap: 16 — ABNORMAL HIGH (ref 5–15)
BUN: 18 mg/dL (ref 6–20)
CO2: 16 mmol/L — ABNORMAL LOW (ref 22–32)
Calcium: 8.9 mg/dL (ref 8.9–10.3)
Chloride: 108 mmol/L (ref 98–111)
Creatinine, Ser: 0.93 mg/dL (ref 0.44–1.00)
GFR calc Af Amer: >60 mL/min (ref >60)
GFR calc non Af Amer: >60 mL/min (ref >60)
Glucose, Bld: 93 mg/dL (ref 70–99)
Potassium: 4.4 mmol/L (ref 3.5–5.1)
Sodium: 140 mmol/L (ref 135–145)

## 2019-06-01 LAB — TROPONIN I (HIGH SENSITIVITY): Troponin I (High Sensitivity): 13 ng/L (ref <18)

## 2019-06-01 MED ORDER — SODIUM CHLORIDE 0.9% FLUSH: 3.0000 mL | Freq: Once | INTRAVENOUS | Status: DC

## 2019-06-01 NOTE — ED Triage Notes (Signed)
To ED for eval of generalized 'fullness' to her body. Started yesterday. States she was seen at [facility] and sent to ED for further eval. CP is described as a fullness. Speaks in full clear sentences. No difficulty - more tired than normal per pt.

## 2019-06-04 ENCOUNTER — Telehealth: Payer: Self-pay

## 2019-06-04 NOTE — Telephone Encounter (Signed)
Please schedule

## 2019-06-04 NOTE — Telephone Encounter (Signed)
Patient was recently seen by her PCP, shortness of breath, Lasix has been started.  Please set her up to see me in the office within the next 1 week.  JG

## 2019-06-04 NOTE — Telephone Encounter (Signed)
Pt called stating that her pcp wants her to be seen by [place]. See ov note from [facility]. Pt said she is having fluid build up and not lay down without having difficulty breathing. Please advise.//ah

## 2019-06-09 ENCOUNTER — Encounter: Payer: Self-pay | Admitting: Cardiology

## 2019-06-10 ENCOUNTER — Other Ambulatory Visit: Payer: Self-pay

## 2019-06-10 ENCOUNTER — Ambulatory Visit (INDEPENDENT_AMBULATORY_CARE_PROVIDER_SITE_OTHER): Payer: No Typology Code available for payment source | Admitting: Cardiology

## 2019-06-10 ENCOUNTER — Telehealth: Payer: Self-pay

## 2019-06-10 ENCOUNTER — Encounter: Payer: Self-pay | Admitting: Cardiology

## 2019-06-10 VITALS — BP 132/90 | HR 89 | Temp 98.2°F | Ht 61.0 in | Wt 186.0 lb

## 2019-06-10 DIAGNOSIS — I5033 Acute on chronic diastolic (congestive) heart failure: Secondary | ICD-10-CM | POA: Diagnosis not present

## 2019-06-10 DIAGNOSIS — E8779 Other fluid overload: Secondary | ICD-10-CM

## 2019-06-10 DIAGNOSIS — R0609 Other forms of dyspnea: Secondary | ICD-10-CM

## 2019-06-10 DIAGNOSIS — Z720 Tobacco use: Secondary | ICD-10-CM | POA: Diagnosis not present

## 2019-06-10 DIAGNOSIS — G4733 Obstructive sleep apnea (adult) (pediatric): Secondary | ICD-10-CM

## 2019-06-10 MED ORDER — SPIRONOLACTONE 25 MG PO TABS: 25.0000 mg | ORAL_TABLET | Freq: Every day | ORAL | 2 refills | Status: DC

## 2019-06-10 NOTE — Telephone Encounter (Signed)
Pt went to pharmacy and says she was suppose to pick up aldactone and it wasn't there?

## 2019-06-10 NOTE — Patient Instructions (Addendum)
Diastolic dysfunction likely related to hypertension, COPD, OSA, and weight.  Follow low sodium diet. Avoid canned vegetables, processed meats, excessive fluids, etc. Take it easy until seen by [place] next. Start Aldactone 25 mg daily in the morning. BMP and BNP in 2 weeks before you see [place] next.    Heart Failure, Diagnosis  Heart failure means that your heart is not able to pump blood in the right way. This makes it hard for your body to work well. Heart failure is usually a long-term (chronic) condition. You must take good care of yourself and follow your treatment plan from your doctor. What are the causes? This condition may be caused by:  High blood pressure.  Build up of cholesterol and fat in the arteries.  Heart attack. This injures the heart muscle.  Heart valves that do not open and close properly.  Damage of the heart muscle. This is also called cardiomyopathy.  Lung disease.  Abnormal heart rhythms. What increases the risk? The risk of heart failure goes up as a person ages. This condition is also more likely to develop in people who:  Are overweight.  Are female.  Smoke or chew tobacco.  Abuse alcohol or illegal drugs.  Have taken medicines that can damage the heart.  Have diabetes.  Have abnormal heart rhythms.  Have thyroid problems.  Have low blood counts (anemia). What are the signs or symptoms? Symptoms of this condition include:  Shortness of breath.  Coughing.  Swelling of the feet, ankles, legs, or belly.  Losing weight for no reason.  Trouble breathing.  Waking from sleep because of the need to sit up and get more air.  Rapid heartbeat.  Being very tired.  Feeling dizzy, or feeling like you may pass out (faint).  Having no desire to eat.  Feeling like you may vomit (nauseous).  Peeing (urinating) more at night.  Feeling confused. How is this treated?     This condition may be treated with:  Medicines. These can be given to  treat blood pressure and to make the heart muscles stronger.  Changes in your daily life. These may include eating a healthy diet, staying at a healthy body weight, quitting tobacco and illegal drug use, or doing exercises.  Surgery. Surgery can be done to open blocked valves, or to put devices in the heart, such as pacemakers.  A donor heart (heart transplant). You will receive a healthy heart from a donor. Follow these instructions at home:  Treat other conditions as told by your doctor. These may include high blood pressure, diabetes, thyroid disease, or abnormal heart rhythms.  Learn as much as you can about heart failure.  Get support as you need it.  Keep all follow-up visits as told by your doctor. This is important. Summary  Heart failure means that your heart is not able to pump blood in the right way.  This condition is caused by high blood pressure, heart attack, or damage of the heart muscle.  Symptoms of this condition include shortness of breath and swelling of the feet, ankles, legs, or belly. You may also feel very tired or feel like you may vomit.  You may be treated with medicines, surgery, or changes in your daily life.  Treat other health conditions as told by your doctor. This information is not intended to replace advice given to you by your health care provider. Make sure you discuss any questions you have with your health care provider. Document Released: 06/25/2008 Document  Revised: 12/04/2018 Document Reviewed: 12/04/2018 Elsevier Patient Education  [place].

## 2019-06-10 NOTE — Telephone Encounter (Signed)
Pt aware.

## 2019-06-10 NOTE — Telephone Encounter (Signed)
It is now at [facility] on [address]

## 2019-06-10 NOTE — Progress Notes (Signed)
Primary Physician/Referring:  Aura Dials, PA-C  Patient ID: Mary Benson, female    DOB: [birth year], 48 y.o.   MRN: [id number]  Chief Complaint  Patient presents with  . Shortness of Breath  . Follow-up    HPI: Mary Benson  is a 48 y.o. female  with past medical history is significant for migraine with aura and now controlled with topamax and has 2-3 episodes a week. Symtoms have improved over years, but still present.  She has hypercholesterolemia, hyperglycemia, mild obesity, mild obstructive sleep apnea by sleep evaluation in February 2020 and unable to tolerate CPAP and also restless leg syndrome, prior history of stroke in August 2018, when she presented with speech difficulty and left sided weakness and resolved in a few hours.  She also has tobacco use disorder, smokes a pack or less a day. She has had prior history of seizure disorder but has been seizure-free since 2012.    Here today for acute visit for swelling and shortness of breath. She has had generalized swelling over the last few weeks and has noted 20 lbs weight gain since April. She has had pro-BNP of 264 and has been started on Lasix with some improvement. She reports her symptoms got worse one week ago, and states that her feet were so swollen after getting off work that she had to cut her shoes off. Swelling is all over. 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 Does mention that she has been sleeping propped up due to feeling like she is drowning with laying flat. She denies any chest pain like she had had before.   She was last seen by [place] in June, underwent coronary CTA that showed mild stenosis in her LAD (<50%). Echocardiogram at that time also showed normal LVEF and grade 2 diastolic dysfunction.   She states that she has tried to make diet changes and feels that she eats healthy. She is working to quit  smoking and now smokes maybe 5 cigarettes per day.      Past Medical History:  Diagnosis Date  . Anxiety   . Cancer (HCC)    cervical, uterine,   . Distal radius fracture, right 02/22/2013  . History of cervical cancer   . Laceration of forehead 02/22/2013   sutures in place  . Seasonal allergies   . Seizures ([place])    last seizure > 1 yr.; seizures caused by head trauma    Past Surgical History:  Procedure Laterality Date  . ABDOMINAL HYSTERECTOMY     partial  . CESAREAN SECTION  1997  . CHOLECYSTECTOMY    . KNEE ARTHROSCOPY Right 09/2014  . LAPAROSCOPIC LYSIS OF ADHESIONS  01/31/2006  . OPEN REDUCTION INTERNAL FIXATION (ORIF) DISTAL RADIAL FRACTURE Right 02/24/2013   Procedure: OPEN REDUCTION INTERNAL FIXATION (ORIF) RIGHT DISTAL RADIUS FRACTURE;  Surgeon: Jolyn Nap, MD;  Location: [facility];  Service: Orthopedics;  Laterality: Right;    Social History   Socioeconomic History  . Marital status: Single    Spouse name: Not on file  . Number of children: 1  . Years of education: 13  . Highest education level: Not on file  Occupational History  . Occupation: Chief Financial Officer  Social Needs  . Financial resource strain: Not on file  . Food insecurity    Worry: Not on file    Inability: Not  on file  . Transportation needs    Medical: Not on file    Non-medical: Not on file  Tobacco Use  . Smoking status: Current Every Day Smoker    Packs/day: 1.00    Years: 24.00    Pack years: 24.00    Types: Cigarettes  . Smokeless tobacco: Never Used  Substance and Sexual Activity  . Alcohol use: Yes    Alcohol/week: 0.0 standard drinks    Comment: occasionally  . Drug use: No  . Sexual activity: Not on file  Lifestyle  . Physical activity    Days per week: Not on file    Minutes per session: Not on file  . Stress: Not on file  Relationships  . Social Herbalist on phone: Not on file    Gets together: Not on file    Attends religious  service: Not on file    Active member of club or organization: Not on file    Attends meetings of clubs or organizations: Not on file    Relationship status: Not on file  . Intimate partner violence    Fear of current or ex partner: Not on file    Emotionally abused: Not on file    Physically abused: Not on file    Forced sexual activity: Not on file  Other Topics Concern  . Not on file  Social History Narrative   Lives with fiance, Mary Benson   Caffeine use: 2 cups coffee per day   1-2 Mt dew per day   Right handed    Current Outpatient Medications on File Prior to Visit  Medication Sig Dispense Refill  . furosemide (LASIX) 20 MG tablet Take 20 mg by mouth 2 (two) times daily.    Marland Kitchen aspirin EC 81 MG tablet Take 1 tablet (81 mg total) by mouth daily. (Patient taking differently: Take 81 mg by mouth daily as needed for moderate pain. ) 150 tablet 2  . cetirizine (ZYRTEC) 10 MG tablet Take by mouth as needed.    . gabapentin (NEURONTIN) 100 MG capsule daily as needed.    . rosuvastatin (CRESTOR) 10 MG tablet Take by mouth daily.    . sertraline (ZOLOFT) 25 MG tablet TAKE 2 TABLETS BY MOUTH EVERY DAY    . topiramate (TOPAMAX) 25 MG tablet 2 (two) times a day.     No current facility-administered medications on file prior to visit.     Review of Systems  Constitution: Positive for malaise/fatigue. Negative for chills, decreased appetite and weight gain.  Cardiovascular: Positive for dyspnea on exertion, leg swelling and orthopnea. Negative for chest pain, paroxysmal nocturnal dyspnea and syncope.  Respiratory: Positive for sleep disturbances due to breathing (mild sleep apnea and restless leg. Not on CPAP). Negative for cough.   Endocrine: Negative for cold intolerance.  Hematologic/Lymphatic: Does not bruise/bleed easily.  Skin: Negative for unusual hair distribution.  Musculoskeletal: Negative for joint swelling.  Gastrointestinal: Negative for abdominal pain, anorexia, change in bowel  habit, hematochezia and melena.  Neurological: Negative for headaches and light-headedness.  Psychiatric/Behavioral: Positive for depression. Negative for substance abuse. The patient is nervous/anxious.   All other systems reviewed and are negative.     Objective  Height 5' 1"  (1.549 m), weight 186 lb (84.4 kg). Body mass index is 35.14 kg/m.  Physical Exam  Constitutional: She is oriented to person, place, and time. Vital signs are normal. She appears well-developed and well-nourished.  HENT:  Head: Normocephalic and atraumatic.  Eyes:  Conjunctivae are normal.  Neck: Normal range of motion. Neck supple.  Cardiovascular: Normal rate, regular rhythm, normal heart sounds and intact distal pulses.  Pulmonary/Chest: Effort normal and breath sounds normal. No accessory muscle usage. No respiratory distress.  Abdominal: Soft. Bowel sounds are normal.  Musculoskeletal: Normal range of motion.  Neurological: She is alert and oriented to person, place, and time.  Skin: Skin is warm and dry.  Psychiatric: She has a normal mood and affect.  Vitals reviewed.  Laboratory examination:   Labs 01/22/2019: CBC normal, serum glucose 160 mg, nonfasting.  BUN 21, creatinine 0.92, eGFR greater than 61, CMP normal.  Lipid profile 04/07/2018: Total cholesterol 141, triglycerides 149, HDL 37, LDL 74.  TSH normal.  CMP Latest Ref Rng & Units 06/01/2019 06/08/2017 02/21/2017  Glucose 70 - 99 mg/dL 93 115(H) 98  BUN 6 - 20 mg/dL 18 16 16   Creatinine 0.44 - 1.00 mg/dL 0.93 0.82 0.90  Sodium 135 - 145 mmol/L 140 138 139  Potassium 3.5 - 5.1 mmol/L 4.4 3.5 3.6  Chloride 98 - 111 mmol/L 108 103 105  CO2 22 - 32 mmol/L 16(L) 25 -  Calcium 8.9 - 10.3 mg/dL 8.9 8.9 -  Total Protein 6.5 - 8.1 g/dL - 7.4 -  Total Bilirubin 0.3 - 1.2 mg/dL - 0.5 -  Alkaline Phos 38 - 126 U/L - 77 -  AST 15 - 41 U/L - 19 -  ALT 14 - 54 U/L - 18 -   CBC Latest Ref Rng & Units 06/08/2017 02/21/2017 02/21/2017  WBC 4.0 - 10.5 K/uL  16.2(H) - 12.6(H)  Hemoglobin 12.0 - 15.0 g/dL 14.7 15.0 15.3(H)  Hematocrit 36.0 - 46.0 % 41.3 44.0 42.9  Platelets 150 - 400 K/uL 346 - 343   Cardiac Studies:   Holter Monitor for 48 hours  02/01/2019: No symptoms reported.  There are occasional PACs.  Uneventful Holter monitor.  Coronary CTA 03/02/2019: Non-cardiac:  Normal. Pulmonary veins drain normally to the left atrium. Calcium Score: 35 Agatston units. Coronary Arteries: Right dominant with no anomalies  LM: No plaque or stenosis. LAD system: Mixed plaque proximal LAD, mild (<50%) stenosis. Circumflex system: Calcified plaque proximal LCx, no significant stenosis. RCA system: No plaque or stenosis.  IMPRESSION: 1. Coronary artery calcium score 35 Agatston units. This places the patient in the 98th percentile for age and gender, suggesting high risk for future cardiac events. 2.  Mild stenosis in the proximal LAD.  Echocardiogram 02/12/2019: Normal LV systolic function with EF 55%. Left ventricle cavity is normal in size. Mild concentric hypertrophy of the left ventricle. Normal global wall motion. Doppler evidence of grade II (pseudonormal) diastolic dysfunction, elevated LAP. Calculated EF 55%. Trace aortic regurgitation. Mild tricuspid regurgitation. Estimated pulmonary artery systolic pressure 26 mmHg. IVC is dilated with respiratory variation. Estimated RA pressure 8 mmHg.  Assessment   Dyspnea on exertion  Other fluid overload  Mild hyperlipidemia  Tobacco use  Mild obstructive sleep apnea  01/22/2019  ED visit normal by Care everywhere report   EKG in ER 06/01/2019: Normal sinus rhythm at 84 bpm, normal axis, no evidence of ischemia.  Normal EKG.  Recommendations:   Patient has had progressively worsening dyspnea on exertion, but particularly 1 week ago she developed fatigue and significant leg swelling.  She reports approximate 20 pound weight gain over the last few months.  States that she had to  cut her shoes off 1 week ago due to her leg swelling and is unable to perform  her duties at her second job which is a physical.  I suspect her symptoms are related to diastolic heart failure exacerbation.  She previously had grade 2 diastolic dysfunction on echocardiogram and BNP was slightly elevated at 264.  She is on Lasix, and has had some improvement with this.  I will add Aldactone 25 mg daily.  She will need BMP and also repeat BNP in 2 weeks for follow-up on kidney function and heart failure.  I have counseled her on dietary changes and fluid restrictions to help with this.  Encouraged her to take it easy until she follows back up with me in a few weeks.  I will provide her with a work excuse for this.  She has not had any significant chest pain like she was having prior.  She previously had minimal disease in her LAD on coronary CTA.  She is on appropriate medical therapy.  I will see her back in 2 weeks for close monitoring.  Miquel Dunn, MSN, APRN, FNP-C [facility]. [place] Office: [phone] Fax: [phone]

## 2019-06-14 ENCOUNTER — Encounter: Payer: Self-pay | Admitting: Cardiology

## 2019-06-18 LAB — BASIC METABOLIC PANEL
BUN/Creatinine Ratio: 19 (ref 9–23)
BUN: 17 mg/dL (ref 6–24)
CO2: 22 mmol/L (ref 20–29)
Calcium: 9.4 mg/dL (ref 8.7–10.2)
Chloride: 101 mmol/L (ref 96–106)
Creatinine, Ser: 0.91 mg/dL (ref 0.57–1.00)
GFR calc Af Amer: 88 mL/min/{1.73_m2} (ref >59)
GFR calc non Af Amer: 76 mL/min/{1.73_m2} (ref >59)
Glucose: 111 mg/dL — ABNORMAL HIGH (ref 65–99)
Potassium: 3.9 mmol/L (ref 3.5–5.2)
Sodium: 139 mmol/L (ref 134–144)

## 2019-06-18 LAB — BRAIN NATRIURETIC PEPTIDE: BNP: 20.7 pg/mL (ref 0.0–100.0)

## 2019-06-24 ENCOUNTER — Encounter: Payer: Self-pay | Admitting: Cardiology

## 2019-06-24 ENCOUNTER — Ambulatory Visit: Payer: No Typology Code available for payment source | Admitting: Cardiology

## 2019-06-24 ENCOUNTER — Other Ambulatory Visit: Payer: Self-pay

## 2019-06-24 VITALS — BP 128/82 | HR 91 | Temp 98.4°F | Ht 61.0 in | Wt 181.7 lb

## 2019-06-24 DIAGNOSIS — R0609 Other forms of dyspnea: Secondary | ICD-10-CM | POA: Diagnosis not present

## 2019-06-24 DIAGNOSIS — Z720 Tobacco use: Secondary | ICD-10-CM | POA: Diagnosis not present

## 2019-06-24 DIAGNOSIS — I5032 Chronic diastolic (congestive) heart failure: Secondary | ICD-10-CM

## 2019-06-24 MED ORDER — FUROSEMIDE 20 MG PO TABS: 20.0000 mg | ORAL_TABLET | Freq: Two times a day (BID) | ORAL | 4 refills | Status: DC

## 2019-06-24 NOTE — Progress Notes (Signed)
Primary Physician/Referring:  Selinda Orion  Patient ID: Mary Benson, female    DOB: 08-15-1971, 48 y.o.   MRN: [id number]  Chief Complaint  Patient presents with  . Follow-up  . Acute on chronic diastolic heart failure    HPI: Mary Benson  is a 48 y.o. female  with past medical history is significant for migraine with aura and now controlled with topamax and has 2-3 episodes a week. Symtoms have improved over years, but still present.  She has hypercholesterolemia, hyperglycemia, mild obesity, mild obstructive sleep apnea by sleep evaluation in February 2020 and unable to tolerate CPAP and also restless leg syndrome, prior history of stroke in August 2018, when she presented with speech difficulty and left sided weakness and resolved in a few hours.  She also has tobacco use disorder, smokes a pack or less a day. She has had prior history of seizure disorder but has been seizure-free since 2012.    Patient was seen 2 weeks ago for dyspnea on exertion, orthopnea, and significant leg swelling.  She is also had significant weight gain over the last few months.  BNP was noted to be elevated at 264.  Symptoms were felt to be related to diastolic heart failure exacerbation.  She was started on Aldactone.  She had previously been placed on Lasix by her PCP that she was taking twice a day. She denies any chest pain like she had had before.   Since being on Aldactone, her symptoms have significantly improved and she is feeling much better.  Denies any orthopnea or significant leg swelling.  She continues to have mild dyspnea on exertion.  She is recently had repeat labs that showed improvement in BNP to 20.  Prior to her recent visit, she had started a second job at [employer], she has been off of work due to her symptoms, and she feels that she will likely quit this job as she feels that it may be too strenuous for her.  She underwent coronary CTA that showed mild stenosis in her LAD  (<50%). Echocardiogram at that time also showed normal LVEF and grade 2 diastolic dysfunction.   She states that she has tried to make diet changes and feels that she eats healthy. She is working to quit smoking and now smokes maybe 5 cigarettes per day.    Past Medical History:  Diagnosis Date  . Anxiety   . Cancer (HCC)    cervical, uterine,   . Distal radius fracture, right 02/22/2013  . History of cervical cancer   . Laceration of forehead 02/22/2013   sutures in place  . Seasonal allergies   . Seizures ([place])    last seizure > 1 yr.; seizures caused by head trauma    Past Surgical History:  Procedure Laterality Date  . ABDOMINAL HYSTERECTOMY     partial  . CESAREAN SECTION  1997  . CHOLECYSTECTOMY    . KNEE ARTHROSCOPY Right 09/2014  . LAPAROSCOPIC LYSIS OF ADHESIONS  01/31/2006  . OPEN REDUCTION INTERNAL FIXATION (ORIF) DISTAL RADIAL FRACTURE Right 02/24/2013   Procedure: OPEN REDUCTION INTERNAL FIXATION (ORIF) RIGHT DISTAL RADIUS FRACTURE;  Surgeon: Jolyn Nap, MD;  Location: [facility];  Service: Orthopedics;  Laterality: Right;    Social History   Socioeconomic History  . Marital status: Single    Spouse name: Not on file  . Number of children: 1  . Years of education: 44  . Highest education level: Not on  file  Occupational History  . Occupation: Chief Financial Officer  Social Needs  . Financial resource strain: Not on file  . Food insecurity    Worry: Not on file    Inability: Not on file  . Transportation needs    Medical: Not on file    Non-medical: Not on file  Tobacco Use  . Smoking status: Current Every Day Smoker    Packs/day: 1.00    Years: 24.00    Pack years: 24.00    Types: Cigarettes  . Smokeless tobacco: Never Used  Substance and Sexual Activity  . Alcohol use: Yes    Alcohol/week: 0.0 standard drinks    Comment: occasionally  . Drug use: No  . Sexual activity: Not on file  Lifestyle  . Physical activity    Days per week:  Not on file    Minutes per session: Not on file  . Stress: Not on file  Relationships  . Social Herbalist on phone: Not on file    Gets together: Not on file    Attends religious service: Not on file    Active member of club or organization: Not on file    Attends meetings of clubs or organizations: Not on file    Relationship status: Not on file  . Intimate partner violence    Fear of current or ex partner: Not on file    Emotionally abused: Not on file    Physically abused: Not on file    Forced sexual activity: Not on file  Other Topics Concern  . Not on file  Social History Narrative   Lives with fiance, [name]   Caffeine use: 2 cups coffee per day   1-2 Mt dew per day   Right handed    Current Outpatient Medications on File Prior to Visit  Medication Sig Dispense Refill  . aspirin EC 81 MG tablet Take 1 tablet (81 mg total) by mouth daily. (Patient taking differently: Take 81 mg by mouth daily as needed for moderate pain. ) 150 tablet 2  . cetirizine (ZYRTEC) 10 MG tablet Take by mouth as needed.    . gabapentin (NEURONTIN) 100 MG capsule daily as needed.    . rosuvastatin (CRESTOR) 10 MG tablet Take by mouth daily.    . sertraline (ZOLOFT) 25 MG tablet TAKE 2 TABLETS BY MOUTH EVERY DAY    . spironolactone (ALDACTONE) 25 MG tablet Take 1 tablet (25 mg total) by mouth daily. 30 tablet 2  . topiramate (TOPAMAX) 25 MG tablet 2 (two) times a day.     No current facility-administered medications on file prior to visit.     Review of Systems  Constitution: Negative for chills, decreased appetite, malaise/fatigue and weight gain.  Cardiovascular: Negative for chest pain, dyspnea on exertion, leg swelling, orthopnea, paroxysmal nocturnal dyspnea and syncope.  Respiratory: Positive for sleep disturbances due to breathing (mild sleep apnea and restless leg. Not on CPAP). Negative for cough.   Endocrine: Negative for cold intolerance.  Hematologic/Lymphatic: Does not  bruise/bleed easily.  Skin: Negative for unusual hair distribution.  Musculoskeletal: Negative for joint swelling.  Gastrointestinal: Negative for abdominal pain, anorexia, change in bowel habit, hematochezia and melena.  Neurological: Negative for headaches and light-headedness.  Psychiatric/Behavioral: Positive for depression. Negative for substance abuse. The patient is nervous/anxious.   All other systems reviewed and are negative.     Objective  Blood pressure 128/82, pulse 91, temperature 98.4 F (36.9 C), height 5' 1" (1.549 m),  weight 181 lb 11.2 oz (82.4 kg), SpO2 98 %. Body mass index is 34.33 kg/m.  Physical Exam  Constitutional: She is oriented to person, place, and time. Vital signs are normal. She appears well-developed and well-nourished.  HENT:  Head: Normocephalic and atraumatic.  Eyes: Conjunctivae are normal.  Neck: Normal range of motion. Neck supple.  Cardiovascular: Normal rate, regular rhythm, normal heart sounds and intact distal pulses.  Pulmonary/Chest: Effort normal and breath sounds normal. No accessory muscle usage. No respiratory distress.  Abdominal: Soft. Bowel sounds are normal.  Musculoskeletal: Normal range of motion.  Neurological: She is alert and oriented to person, place, and time.  Skin: Skin is warm and dry.  Psychiatric: She has a normal mood and affect.  Vitals reviewed.  Laboratory examination:   Labs 01/22/2019: CBC normal, serum glucose 160 mg, nonfasting.  BUN 21, creatinine 0.92, eGFR greater than 61, CMP normal.  Lipid profile 04/07/2018: Total cholesterol 141, triglycerides 149, HDL 37, LDL 74.  TSH normal.  CMP Latest Ref Rng & Units 06/17/2019 06/01/2019 06/08/2017  Glucose 65 - 99 mg/dL 111(H) 93 115(H)  BUN 6 - 24 mg/dL _0 Creatinine 0.57 - 1.00 mg/dL 0.91 0.93 0.82  Sodium 134 - 144 mmol/L 139 140 138  Potassium 3.5 - 5.2 mmol/L 3.9 4.4 3.5  Chloride 96 - 106 mmol/L 101 108 103  CO2 20 - 29 mmol/L 22 16(L) 25  Calcium  8.7 - 10.2 mg/dL 9.4 8.9 8.9  Total Protein 6.5 - 8.1 g/dL - - 7.4  Total Bilirubin 0.3 - 1.2 mg/dL - - 0.5  Alkaline Phos 38 - 126 U/L - - 77  AST 15 - 41 U/L - - 19  ALT 14 - 54 U/L - - 18   CBC Latest Ref Rng & Units 06/08/2017 02/21/2017 02/21/2017  WBC 4.0 - 10.5 K/uL 16.2(H) - 12.6(H)  Hemoglobin 12.0 - 15.0 g/dL 14.7 15.0 15.3(H)  Hematocrit 36.0 - 46.0 % 41.3 44.0 42.9  Platelets 150 - 400 K/uL 346 - 343   Cardiac Studies:   Holter Monitor for 48 hours  02/01/2019: No symptoms reported.  There are occasional PACs.  Uneventful Holter monitor.  Coronary CTA 03/02/2019: Non-cardiac:  Normal. Pulmonary veins drain normally to the left atrium. Calcium Score: 35 Agatston units. Coronary Arteries: Right dominant with no anomalies  LM: No plaque or stenosis. LAD system: Mixed plaque proximal LAD, mild (<50%) stenosis. Circumflex system: Calcified plaque proximal LCx, no significant stenosis. RCA system: No plaque or stenosis.  IMPRESSION: 1. Coronary artery calcium score 35 Agatston units. This places the patient in the 98th percentile for age and gender, suggesting high risk for future cardiac events. 2.  Mild stenosis in the proximal LAD.  Echocardiogram 02/12/2019: Normal LV systolic function with EF 55%. Left ventricle cavity is normal in size. Mild concentric hypertrophy of the left ventricle. Normal global wall motion. Doppler evidence of grade II (pseudonormal) diastolic dysfunction, elevated LAP. Calculated EF 55%. Trace aortic regurgitation. Mild tricuspid regurgitation. Estimated pulmonary artery systolic pressure 26 mmHg. IVC is dilated with respiratory variation. Estimated RA pressure 8 mmHg.  Assessment   Chronic diastolic (congestive) heart failure (HCC)  Dyspnea on exertion  Tobacco use  01/22/2019  ED visit normal by [facility] everywhere report   EKG in ER 06/01/2019: Normal sinus rhythm at 84 bpm, normal axis, no evidence of ischemia.  Normal EKG.   Recommendations:   Since being on Aldactone, her symptoms have significantly improved.  BNP has also improved  and normalized.  Kidney function has remained stable.  Continue to feel her symptoms are related to diastolic heart failure exacerbation.  Continue with current medications, but have advised her to cut back on her Lasix to once a day and less she is having worsening shortness of breath or leg edema, can take an additional dose.  I recommended that she continue to work on quitting smoking and also weight loss to help with her symptoms as well as I do feel that they were likely contributing.  She previously had minimal disease in her LAD on coronary CTA.  She is on appropriate medical therapy.  No episodes of chest pain.  I will see her back in 3 months for follow-up on diastolic heart failure.  She is a candidate for deliver heart failure trial, will have her discuss with research about participating in this today.  Miquel Dunn, MSN, APRN, FNP-C [facility]. [place] Office: [phone] Fax: [phone]

## 2019-07-08 ENCOUNTER — Ambulatory Visit (INDEPENDENT_AMBULATORY_CARE_PROVIDER_SITE_OTHER): Payer: No Typology Code available for payment source | Admitting: Cardiology

## 2019-07-08 ENCOUNTER — Other Ambulatory Visit: Payer: Self-pay

## 2019-07-08 DIAGNOSIS — I5032 Chronic diastolic (congestive) heart failure: Secondary | ICD-10-CM

## 2019-07-08 DIAGNOSIS — Z006 Encounter for examination for normal comparison and control in clinical research program: Secondary | ICD-10-CM

## 2019-07-08 NOTE — Progress Notes (Signed)
Patient here for screening for Deliver study

## 2019-07-14 ENCOUNTER — Telehealth: Payer: Self-pay

## 2019-07-14 ENCOUNTER — Other Ambulatory Visit: Payer: Self-pay

## 2019-07-14 MED ORDER — NITROGLYCERIN 0.4 MG SL SUBL: 0.4000 mg | SUBLINGUAL_TABLET | SUBLINGUAL | 1 refills | Status: DC | PRN

## 2019-07-14 NOTE — Telephone Encounter (Signed)
Pt called stating that she has been having left sided chest heaviness x 45 min. Rt jaw pain. Denies SOB and arm pain, no other symptoms. Verbally discussed with [facility] and she said to send in nitro. PT aware of instructions and rx sent to pharmacy.//ah

## 2019-09-27 ENCOUNTER — Ambulatory Visit: Payer: No Typology Code available for payment source | Admitting: Cardiology

## 2019-09-29 ENCOUNTER — Encounter: Payer: Self-pay | Admitting: Cardiology

## 2019-09-29 ENCOUNTER — Other Ambulatory Visit: Payer: Self-pay

## 2019-09-29 ENCOUNTER — Ambulatory Visit (INDEPENDENT_AMBULATORY_CARE_PROVIDER_SITE_OTHER): Payer: No Typology Code available for payment source | Admitting: Cardiology

## 2019-09-29 VITALS — BP 130/83 | HR 100 | Temp 97.3°F | Resp 16 | Ht 61.0 in | Wt 191.6 lb

## 2019-09-29 DIAGNOSIS — K219 Gastro-esophageal reflux disease without esophagitis: Secondary | ICD-10-CM

## 2019-09-29 DIAGNOSIS — R55 Syncope and collapse: Secondary | ICD-10-CM | POA: Diagnosis not present

## 2019-09-29 DIAGNOSIS — I5032 Chronic diastolic (congestive) heart failure: Secondary | ICD-10-CM | POA: Diagnosis not present

## 2019-09-29 DIAGNOSIS — R0609 Other forms of dyspnea: Secondary | ICD-10-CM

## 2019-09-29 DIAGNOSIS — Z720 Tobacco use: Secondary | ICD-10-CM

## 2019-09-29 NOTE — Progress Notes (Signed)
Primary Physician/Referring:  Selinda Orion  Patient ID: Mary Benson, female    DOB: 12-07-1970, 48 y.o.   MRN: [id number]  Chief Complaint  Patient presents with  . chronic diastolic heart failure    3 month follow up    HPI: Mary Benson  is a 48 y.o. female  with past medical history is significant for migraine with aura, hypercholesterolemia, hyperglycemia, mild obesity, mild obstructive sleep apnea by sleep evaluation in February 2020 and unable to tolerate CPAP and also restless leg syndrome, prior history of stroke in August 2018, when she presented with speech difficulty and left sided weakness and resolved in a few hours.  She also has tobacco use disorder, smokes a pack or less a day. She has had prior history of seizure disorder but has been seizure-free since 2012.  She underwent coronary CTA in June 2020 that showed mild stenosis in her LAD (<50%). Echocardiogram at that time also showed normal LVEF and grade 2 diastolic dysfunction.  She is now here on 21-monthoffice visit.  She was seen in September 2020 with acute diastolic heart failure exacerbation.  Since being on Aldactone her symptoms have significantly improved.  She continues to have dyspnea on exertion that has been stable.  She reports that she is overall feeling well, but has been under significant amount of stress as she has recently separated from her fianc of 7 years.  Admits that her diet has not been the best and she has been eating a lot of chocolate.  She has noticed increased indigestion and hiccups.  She was also seen in the emergency room in early November with constipation that is now resolved.  She has had 2 episodes of chest pain since last seen by me, was given a prescription for nitroglycerin.  She states one episode lasted for several hours and required taking nitroglycerin 3 times, but did not completely resolve with this.  A few weeks later she again had chest discomfort that was  easily resolved with taking 1 nitroglycerin.  Both episodes were not associated with exertion.  Had severe headache.  She does mention in a few weeks ago while at work, she had not been feeling well and was standing talking to a coworker when she suddenly felt very hot and reportedly passed out for a few seconds.  Denies any chest discomfort, palpitations, or shortness of breath at that time.  She was not evaluated in the emergency room.  She has not had any further episodes of syncope or recurrence since.  No near syncope.  She did notice that her blood pressure was slightly elevated that day and has since been keeping a close eye on this.  She is actually supposed to see her PCP in the next few weeks for follow-up on this.  She unfortunately has not been able to quit smoking due to the personal life stress but is optimistic that she will continue to work on this.  At her last visit, she was screened for deliver trial, but failed screening due to low BNP.   Past Medical History:  Diagnosis Date  . Anxiety   . Cancer (HCC)    cervical, uterine,   . Distal radius fracture, right 02/22/2013  . History of cervical cancer   . Laceration of forehead 02/22/2013   sutures in place  . Seasonal allergies   . Seizures (HIvor    last seizure > 1 yr.; seizures caused by head trauma  Past Surgical History:  Procedure Laterality Date  . ABDOMINAL HYSTERECTOMY     partial  . CESAREAN SECTION  1997  . CHOLECYSTECTOMY    . KNEE ARTHROSCOPY Right 09/2014  . LAPAROSCOPIC LYSIS OF ADHESIONS  01/31/2006  . OPEN REDUCTION INTERNAL FIXATION (ORIF) DISTAL RADIAL FRACTURE Right 02/24/2013   Procedure: OPEN REDUCTION INTERNAL FIXATION (ORIF) RIGHT DISTAL RADIUS FRACTURE;  Surgeon: Jolyn Nap, MD;  Location: [facility];  Service: Orthopedics;  Laterality: Right;    Social History   Socioeconomic History  . Marital status: Single    Spouse name: Not on file  . Number of children: 1  .  Years of education: 69  . Highest education level: Not on file  Occupational History  . Occupation: [employer]  Tobacco Use  . Smoking status: Current Every Day Smoker    Packs/day: 1.00    Years: 24.00    Pack years: 24.00    Types: Cigarettes  . Smokeless tobacco: Never Used  Substance and Sexual Activity  . Alcohol use: Yes    Alcohol/week: 0.0 standard drinks    Comment: occasionally  . Drug use: No  . Sexual activity: Not on file  Other Topics Concern  . Not on file  Social History Narrative   Lives with fiance, [name]   Caffeine use: 2 cups coffee per day   1-2 Mt dew per day   Right handed   Social Determinants of Health   Financial Resource Strain:   . Difficulty of Paying Living Expenses: Not on file  Food Insecurity:   . Worried About Charity fundraiser in the Last Year: Not on file  . Ran Out of Food in the Last Year: Not on file  Transportation Needs:   . Lack of Transportation (Medical): Not on file  . Lack of Transportation (Non-Medical): Not on file  Physical Activity:   . Days of Exercise per Week: Not on file  . Minutes of Exercise per Session: Not on file  Stress:   . Feeling of Stress : Not on file  Social Connections:   . Frequency of Communication with Friends and Family: Not on file  . Frequency of Social Gatherings with Friends and Family: Not on file  . Attends Religious Services: Not on file  . Active Member of Clubs or Organizations: Not on file  . Attends Archivist Meetings: Not on file  . Marital Status: Not on file  Intimate Partner Violence:   . Fear of Current or Ex-Partner: Not on file  . Emotionally Abused: Not on file  . Physically Abused: Not on file  . Sexually Abused: Not on file    Current Outpatient Medications on File Prior to Visit  Medication Sig Dispense Refill  . aspirin EC 81 MG tablet Take 1 tablet (81 mg total) by mouth daily. (Patient taking differently: Take 81 mg by mouth daily as needed for  moderate pain. ) 150 tablet 2  . cetirizine (ZYRTEC) 10 MG tablet Take by mouth as needed.    . furosemide (LASIX) 20 MG tablet Take 1 tablet (20 mg total) by mouth 2 (two) times daily. 30 tablet 4  . nitroGLYCERIN (NITROSTAT) 0.4 MG SL tablet Place 1 tablet (0.4 mg total) under the tongue every 5 (five) minutes as needed for chest pain. Not to exceed 3 15 tablet 1  . rosuvastatin (CRESTOR) 10 MG tablet Take by mouth daily.    . sertraline (ZOLOFT) 25 MG tablet TAKE 2  TABLETS BY MOUTH EVERY DAY    . spironolactone (ALDACTONE) 25 MG tablet Take 25 mg by mouth daily.    Marland Kitchen gabapentin (NEURONTIN) 100 MG capsule daily as needed.    . topiramate (TOPAMAX) 25 MG tablet 2 (two) times a day.     No current facility-administered medications on file prior to visit.    Review of Systems  Constitution: Positive for weight gain. Negative for chills, decreased appetite and malaise/fatigue.  Cardiovascular: Positive for chest pain (occasional) and dyspnea on exertion. Negative for leg swelling, orthopnea, paroxysmal nocturnal dyspnea and syncope.  Respiratory: Positive for sleep disturbances due to breathing (mild sleep apnea and restless leg. Not on CPAP). Negative for cough.   Endocrine: Negative for cold intolerance.  Hematologic/Lymphatic: Does not bruise/bleed easily.  Skin: Negative for unusual hair distribution.  Musculoskeletal: Negative for joint swelling.  Gastrointestinal: Positive for heartburn. Negative for abdominal pain, anorexia, change in bowel habit, hematochezia and melena.  Neurological: Negative for headaches and light-headedness.  Psychiatric/Behavioral: Positive for depression. Negative for substance abuse. The patient is nervous/anxious.   All other systems reviewed and are negative.     Objective  Blood pressure 130/83, pulse 100, temperature (!) 97.3 F (36.3 C), temperature source Temporal, resp. rate 16, height 5' 1"  (1.549 m), weight 191 lb 9.6 oz (86.9 kg), SpO2 98 %. Body  mass index is 36.2 kg/m.    Physical Exam  Constitutional: She is oriented to person, place, and time. Vital signs are normal. She appears well-developed and well-nourished.  HENT:  Head: Normocephalic and atraumatic.  Eyes: Conjunctivae are normal.  Cardiovascular: Normal rate, regular rhythm, normal heart sounds and intact distal pulses.  Pulmonary/Chest: Effort normal and breath sounds normal. No accessory muscle usage. No respiratory distress.  Abdominal: Soft. Bowel sounds are normal.  Musculoskeletal:        General: Normal range of motion.     Cervical back: Normal range of motion and neck supple.  Neurological: She is alert and oriented to person, place, and time.  Skin: Skin is warm and dry.  Psychiatric: She has a normal mood and affect.  Vitals reviewed.  Laboratory examination:   Labs 01/22/2019: CBC normal, serum glucose 160 mg, nonfasting.  BUN 21, creatinine 0.92, eGFR greater than 61, CMP normal.  Lipid profile 04/07/2018: Total cholesterol 141, triglycerides 149, HDL 37, LDL 74.  TSH normal.  CMP Latest Ref Rng & Units 06/17/2019 06/01/2019 06/08/2017  Glucose 65 - 99 mg/dL 111(H) 93 115(H)  BUN 6 - 24 mg/dL 17 18 16   Creatinine 0.57 - 1.00 mg/dL 0.91 0.93 0.82  Sodium 134 - 144 mmol/L 139 140 138  Potassium 3.5 - 5.2 mmol/L 3.9 4.4 3.5  Chloride 96 - 106 mmol/L 101 108 103  CO2 20 - 29 mmol/L 22 16(L) 25  Calcium 8.7 - 10.2 mg/dL 9.4 8.9 8.9  Total Protein 6.5 - 8.1 g/dL - - 7.4  Total Bilirubin 0.3 - 1.2 mg/dL - - 0.5  Alkaline Phos 38 - 126 U/L - - 77  AST 15 - 41 U/L - - 19  ALT 14 - 54 U/L - - 18   CBC Latest Ref Rng & Units 06/08/2017 02/21/2017 02/21/2017  WBC 4.0 - 10.5 K/uL 16.2(H) - 12.6(H)  Hemoglobin 12.0 - 15.0 g/dL 14.7 15.0 15.3(H)  Hematocrit 36.0 - 46.0 % 41.3 44.0 42.9  Platelets 150 - 400 K/uL 346 - 343   Cardiac Studies:   Holter Monitor for 48 hours  02/01/2019: No symptoms reported.  There are occasional PACs.  Uneventful Holter  monitor.  Coronary CTA 03/02/2019: Non-cardiac:  Normal. Pulmonary veins drain normally to the left atrium. Calcium Score: 35 Agatston units. Coronary Arteries: Right dominant with no anomalies  LM: No plaque or stenosis. LAD system: Mixed plaque proximal LAD, mild (<50%) stenosis. Circumflex system: Calcified plaque proximal LCx, no significant stenosis. RCA system: No plaque or stenosis.  IMPRESSION: 1. Coronary artery calcium score 35 Agatston units. This places the patient in the 98th percentile for age and gender, suggesting high risk for future cardiac events. 2.  Mild stenosis in the proximal LAD.  Echocardiogram 02/12/2019: Normal LV systolic function with EF 55%. Left ventricle cavity is normal in size. Mild concentric hypertrophy of the left ventricle. Normal global wall motion. Doppler evidence of grade II (pseudonormal) diastolic dysfunction, elevated LAP. Calculated EF 55%. Trace aortic regurgitation. Mild tricuspid regurgitation. Estimated pulmonary artery systolic pressure 26 mmHg. IVC is dilated with respiratory variation. Estimated RA pressure 8 mmHg.  Assessment   Vasovagal syncope  GERD without esophagitis  Chronic diastolic (congestive) heart failure (HCC)  Dyspnea on exertion  Tobacco use  01/22/2019  ED visit normal by Care everywhere report   EKG in ER 06/01/2019: Normal sinus rhythm at 84 bpm, normal axis, no evidence of ischemia.  Normal EKG.  Recommendations:   Patient's recent episode of syncope is suggestive of vasovagal etiology.  She has previously worn event monitor in May 2020 without any arrhythmias and has mild stenosis by coronary CTA in June 2020.  Do not feel that she needs further cardiac evaluation at this point.  We will continue with watchful waiting.  Advised her that if she should have recurrence of symptoms to lay down to avoid syncopal episode.  Also question if stress is playing a role in this as she is currently under  significant amount of stress in dealing with depression from her recent break-up.  She will also discuss this with her PCP.  I suspect her indigestion is related to GERD from poor dietary compliance.  We have discussed dietary modifications.  Encouraged her to use omeprazole over-the-counter for the next few weeks to see if she has improvement.  She will notify me through the portal on how she is feeling with this.  She has not had any exertional chest pain.  She has had one episode of nitroglycerin responsive chest pain that occurred at rest.  We will continue with aggressive risk factor modification and medical management.  She is to see her PCP in the next few weeks, would recommend lipid panel be performed at that time as this has not been evaluated since 2019.  Blood pressure has been well controlled at home for the last few weeks.  Encouraged her to continue to monitor and to notify me if elevated.  She has not had any worsening shortness of breath.  No clinical evidence of decompensated heart failure.  Leg edema has been stable.  Continue with Lasix and Aldactone.  This will also hopefully continue to improve if she works on weight loss.  Encouraged her to continue to work on smoking cessation to further reduce her risk factors.  She is obviously going through any significant stressful time, but encouraged her to stay motivated and to continue to work on this.  We will plan to see her back in 8 weeks for close follow-up, but encouraged her to contact me sooner if needed.  Miquel Dunn, MSN, APRN, FNP-C [facility]. [place] Office: [phone] Fax: [phone]

## 2019-09-29 NOTE — Patient Instructions (Signed)
Start Omeprazole daily 30 minutes before breakfast

## 2019-09-30 ENCOUNTER — Encounter: Payer: Self-pay | Admitting: Cardiology

## 2019-11-24 ENCOUNTER — Other Ambulatory Visit: Payer: Self-pay

## 2019-11-24 ENCOUNTER — Encounter: Payer: Self-pay | Admitting: Cardiology

## 2019-11-24 ENCOUNTER — Ambulatory Visit: Payer: No Typology Code available for payment source | Admitting: Cardiology

## 2019-11-24 VITALS — BP 120/82 | Temp 98.9°F | Ht 61.0 in | Wt 193.0 lb

## 2019-11-24 DIAGNOSIS — I25118 Atherosclerotic heart disease of native coronary artery with other forms of angina pectoris: Secondary | ICD-10-CM

## 2019-11-24 DIAGNOSIS — F17200 Nicotine dependence, unspecified, uncomplicated: Secondary | ICD-10-CM

## 2019-11-24 DIAGNOSIS — K219 Gastro-esophageal reflux disease without esophagitis: Secondary | ICD-10-CM

## 2019-11-24 DIAGNOSIS — I5032 Chronic diastolic (congestive) heart failure: Secondary | ICD-10-CM

## 2019-11-24 DIAGNOSIS — R1084 Generalized abdominal pain: Secondary | ICD-10-CM

## 2019-11-24 MED ORDER — METOPROLOL TARTRATE 25 MG PO TABS: 25.0000 mg | ORAL_TABLET | Freq: Two times a day (BID) | ORAL | 1 refills | Status: DC

## 2019-11-24 MED ORDER — PANTOPRAZOLE SODIUM 40 MG PO TBEC: 40.0000 mg | DELAYED_RELEASE_TABLET | Freq: Every day | ORAL | 1 refills | Status: DC

## 2019-11-24 NOTE — Progress Notes (Signed)
Primary Physician/Referring:  Selinda Orion  Patient ID: Mary Benson, female    DOB: 29-Jul-1971, 49 y.o.   MRN: [id number]  Chief Complaint  Patient presents with  . Loss of Consciousness  . Congestive Heart Failure    HPI: Mary Benson  is a 49 y.o. female  with past medical history is significant for migraine with aura, hypercholesterolemia, hyperglycemia, mild obesity, mild obstructive sleep apnea by sleep evaluation in February 2020 and unable to tolerate CPAP and also restless leg syndrome, prior history of stroke in August 2018, when she presented with speech difficulty and left sided weakness and resolved in a few hours.  She also has tobacco use disorder, smokes a pack or less a day. She has had prior history of seizure disorder but has been seizure-free since 2012.  She underwent coronary CTA in June 2020 that showed mild stenosis in her LAD (<50%). Echocardiogram at that time also showed normal LVEF and grade 2 diastolic dysfunction.    Does not feel well today, has multiple complaints. She has increased fatigue. She is having some abdominal pain describes as pressure and discomfort. Also has loose stools. Of note was seen in ER in Nov 2020 with abdominal pain, fatigue, bloating, felt to be related to constipation and was given lactulose. Also having some back pain that is tender to touch and appears to be worsened by certain positions.   She has some intermittent episodes of dizziness, some better than her last visit. She continues to have some occasional sharp chest pain that resolves after 3-5 minutes. Makes her feel tired afterwards. Does have some issues with indigestion, tries to avoid certain foods. Blood pressure has been stable, with rare spikes in her blood pressure.   In December 2020, she had a questionable episode of syncope that was suggestive of vasovagal etiology. She had previously worn event monitor that was without arrhythmias. She has not had any  recurrence of syncope or near syncope.   She has been able to cut back on her smoking to now making a pack of cigarettes last 4 days.    Past Medical History:  Diagnosis Date  . Anxiety   . Cancer (HCC)    cervical, uterine,   . Distal radius fracture, right 02/22/2013  . History of cervical cancer   . Laceration of forehead 02/22/2013   sutures in place  . Seasonal allergies   . Seizures ([place])    last seizure > 1 yr.; seizures caused by head trauma    Past Surgical History:  Procedure Laterality Date  . ABDOMINAL HYSTERECTOMY     partial  . CESAREAN SECTION  1997  . CHOLECYSTECTOMY    . KNEE ARTHROSCOPY Right 09/2014  . LAPAROSCOPIC LYSIS OF ADHESIONS  01/31/2006  . OPEN REDUCTION INTERNAL FIXATION (ORIF) DISTAL RADIAL FRACTURE Right 02/24/2013   Procedure: OPEN REDUCTION INTERNAL FIXATION (ORIF) RIGHT DISTAL RADIUS FRACTURE;  Surgeon: Jolyn Nap, MD;  Location: [facility];  Service: Orthopedics;  Laterality: Right;    Social History   Socioeconomic History  . Marital status: Single    Spouse name: Not on file  . Number of children: 1  . Years of education: 78  . Highest education level: Not on file  Occupational History  . Occupation: [employer]  Tobacco Use  . Smoking status: Current Every Day Smoker    Packs/day: 1.00    Years: 24.00    Pack years: 24.00    Types: Cigarettes  .  Smokeless tobacco: Never Used  Substance and Sexual Activity  . Alcohol use: Yes    Alcohol/week: 0.0 standard drinks    Comment: occasionally  . Drug use: No  . Sexual activity: Not on file  Other Topics Concern  . Not on file  Social History Narrative   Lives with fiance, [name]   Caffeine use: 2 cups coffee per day   1-2 Mt dew per day   Right handed   Social Determinants of Health   Financial Resource Strain:   . Difficulty of Paying Living Expenses: Not on file  Food Insecurity:   . Worried About Charity fundraiser in the Last Year: Not on file    . Ran Out of Food in the Last Year: Not on file  Transportation Needs:   . Lack of Transportation (Medical): Not on file  . Lack of Transportation (Non-Medical): Not on file  Physical Activity:   . Days of Exercise per Week: Not on file  . Minutes of Exercise per Session: Not on file  Stress:   . Feeling of Stress : Not on file  Social Connections:   . Frequency of Communication with Friends and Family: Not on file  . Frequency of Social Gatherings with Friends and Family: Not on file  . Attends Religious Services: Not on file  . Active Member of Clubs or Organizations: Not on file  . Attends Archivist Meetings: Not on file  . Marital Status: Not on file  Intimate Partner Violence:   . Fear of Current or Ex-Partner: Not on file  . Emotionally Abused: Not on file  . Physically Abused: Not on file  . Sexually Abused: Not on file    Current Outpatient Medications on File Prior to Visit  Medication Sig Dispense Refill  . aspirin EC 81 MG tablet Take 1 tablet (81 mg total) by mouth daily. (Patient taking differently: Take 81 mg by mouth daily as needed for moderate pain. ) 150 tablet 2  . cetirizine (ZYRTEC) 10 MG tablet Take by mouth as needed.    . furosemide (LASIX) 20 MG tablet Take 1 tablet (20 mg total) by mouth 2 (two) times daily. 30 tablet 4  . gabapentin (NEURONTIN) 100 MG capsule daily as needed.    . nitroGLYCERIN (NITROSTAT) 0.4 MG SL tablet Place 1 tablet (0.4 mg total) under the tongue every 5 (five) minutes as needed for chest pain. Not to exceed 3 15 tablet 1  . rosuvastatin (CRESTOR) 10 MG tablet Take by mouth daily.    . sertraline (ZOLOFT) 25 MG tablet TAKE 2 TABLETS BY MOUTH EVERY DAY    . spironolactone (ALDACTONE) 25 MG tablet Take 25 mg by mouth daily.    Marland Kitchen topiramate (TOPAMAX) 25 MG tablet 2 (two) times a day.     No current facility-administered medications on file prior to visit.    Review of Systems  Constitution: Positive for weight gain.  Negative for chills, decreased appetite and malaise/fatigue.  Cardiovascular: Positive for chest pain (occasional) and dyspnea on exertion (stable). Negative for leg swelling, orthopnea, paroxysmal nocturnal dyspnea and syncope.  Respiratory: Positive for sleep disturbances due to breathing (mild sleep apnea and restless leg. Not on CPAP). Negative for cough.   Endocrine: Negative for cold intolerance.  Hematologic/Lymphatic: Does not bruise/bleed easily.  Skin: Negative for unusual hair distribution.  Musculoskeletal: Positive for back pain. Negative for joint swelling.  Gastrointestinal: Positive for bloating, abdominal pain and heartburn. Negative for anorexia, change in bowel  habit, hematochezia and melena.  Neurological: Negative for headaches and light-headedness. Dizziness: intermittent.  Psychiatric/Behavioral: Positive for depression. Negative for substance abuse. The patient is nervous/anxious.   All other systems reviewed and are negative.     Objective  Blood pressure 120/82, temperature 98.9 F (37.2 C), height 5' 1"  (1.549 m), weight 193 lb (87.5 kg), SpO2 97 %. Body mass index is 36.47 kg/m.   Orthostatic VS for the past 72 hrs (Last 3 readings):  Orthostatic BP Patient Position BP Location Cuff Size Orthostatic Pulse  11/24/19 1356 -- -- Right Arm -- --  11/24/19 1355 -- -- Left Arm -- --  11/24/19 1334 127/87 Standing Left Arm Normal 91  11/24/19 1333 120/85 Sitting Left Arm Normal 98  11/24/19 1323 128/86 Supine Left Arm Normal 63      Physical Exam  Constitutional: She is oriented to person, place, and time. Vital signs are normal. She appears well-developed and well-nourished.  HENT:  Head: Normocephalic and atraumatic.  Eyes: Conjunctivae are normal.  Cardiovascular: Normal rate, regular rhythm, normal heart sounds and intact distal pulses.  Pulmonary/Chest: Effort normal and breath sounds normal. No accessory muscle usage. No respiratory distress.  Abdominal:  Soft. Bowel sounds are normal.  Musculoskeletal:        General: Normal range of motion.     Cervical back: Normal range of motion and neck supple.     Thoracic back: Tenderness present.       Back:  Neurological: She is alert and oriented to person, place, and time.  Skin: Skin is warm and dry.  Psychiatric: She has a normal mood and affect.  Vitals reviewed.  Laboratory examination:   Labs 01/22/2019: CBC normal, serum glucose 160 mg, nonfasting.  BUN 21, creatinine 0.92, eGFR greater than 61, CMP normal.  Lipid profile 04/07/2018: Total cholesterol 141, triglycerides 149, HDL 37, LDL 74.  TSH normal.  CMP Latest Ref Rng & Units 06/17/2019 06/01/2019 06/08/2017  Glucose 65 - 99 mg/dL 111(H) 93 115(H)  BUN 6 - 24 mg/dL 17 18 16   Creatinine 0.57 - 1.00 mg/dL 0.91 0.93 0.82  Sodium 134 - 144 mmol/L 139 140 138  Potassium 3.5 - 5.2 mmol/L 3.9 4.4 3.5  Chloride 96 - 106 mmol/L 101 108 103  CO2 20 - 29 mmol/L 22 16(L) 25  Calcium 8.7 - 10.2 mg/dL 9.4 8.9 8.9  Total Protein 6.5 - 8.1 g/dL - - 7.4  Total Bilirubin 0.3 - 1.2 mg/dL - - 0.5  Alkaline Phos 38 - 126 U/L - - 77  AST 15 - 41 U/L - - 19  ALT 14 - 54 U/L - - 18   CBC Latest Ref Rng & Units 06/08/2017 02/21/2017 02/21/2017  WBC 4.0 - 10.5 K/uL 16.2(H) - 12.6(H)  Hemoglobin 12.0 - 15.0 g/dL 14.7 15.0 15.3(H)  Hematocrit 36.0 - 46.0 % 41.3 44.0 42.9  Platelets 150 - 400 K/uL 346 - 343   Cardiac Studies:   Holter Monitor for 48 hours  02/01/2019: No symptoms reported.  There are occasional PACs.  Uneventful Holter monitor.  Coronary CTA 03/02/2019: Non-cardiac:  Normal. Pulmonary veins drain normally to the left atrium. Calcium Score: 35 Agatston units. Coronary Arteries: Right dominant with no anomalies  LM: No plaque or stenosis. LAD system: Mixed plaque proximal LAD, mild (<50%) stenosis. Circumflex system: Calcified plaque proximal LCx, no significant stenosis. RCA system: No plaque or stenosis.  IMPRESSION: 1.  Coronary artery calcium score 35 Agatston units. This places the patient in the  98th percentile for age and gender, suggesting high risk for future cardiac events. 2.  Mild stenosis in the proximal LAD.  Echocardiogram 02/12/2019: Normal LV systolic function with EF 55%. Left ventricle cavity is normal in size. Mild concentric hypertrophy of the left ventricle. Normal global wall motion. Doppler evidence of grade II (pseudonormal) diastolic dysfunction, elevated LAP. Calculated EF 55%. Trace aortic regurgitation. Mild tricuspid regurgitation. Estimated pulmonary artery systolic pressure 26 mmHg. IVC is dilated with respiratory variation. Estimated RA pressure 8 mmHg.  Assessment   Atherosclerosis of native coronary artery of native heart with stable angina pectoris (HCC)  Chronic diastolic (congestive) heart failure ([place]) - Plan: EKG 12-Lead  GERD without esophagitis - Plan: Ambulatory referral to Gastroenterology  Generalized abdominal pain - Plan: Ambulatory referral to Gastroenterology   EKG 11/24/2019: Normal sinus rhythm at 92 bpm, normal axis, no evidence of ischemia.   Recommendations:   Patient is here for 8 week follow up. She has not had any recurrence of syncope or near syncope. She does have some occasional intermittent dizziness with sudden position changes, not noted to be orthostatic. She does continue to have occasional episodes of chest pain that in the past have been relieved by nitroglycerin; however, she has not recently used nitroglycerin and feels may be related to GERD. She does have mild LAD stenosis by previous coronary CTA and is high risk for future coronary events. Although her symptoms may be related to GERD, given her risk, will also medically treat her coronary disease. I will start her on Metoprolol tartrate 25 mg BID. Use nitroglycerin as needed. Encouraged her to use Aspirin 81 mg daily. Continue with statin therapy. She has not had a recent lipid panel,  will obtain this for further risk stratification. I will also start her on pantoprazole 40 mg daily. Will place GI referral given her abdominal complaints and GERD.  In regard to her back pain, do not suspect disection. Blood pressure are equal bilaterally. I suspect musculoskeletal etiology given her tenderness on palpation and worsening with certain positions. I have advised her to try Ibuprofen and heat/ice to her back.   In regard to diastolic heart failure, her dyspnea has been stable. Leg edema is also stable. Will continue with lasix daily and Aldactone. Would recommend continued dietary changes to help promote weight loss.  I have congratulated her on recently being able to cut back on tobacco use. Encouraged her to work towards complete smoking cessation. She continues to wish to hold off on medication at this time. I will continue to monitor her progress. I will see her back in 1 week for close follow up on her symptoms. Advised her to contact me for any worsening symptoms or to proceed to the ER.  Miquel Dunn, MSN, APRN, FNP-C [facility]. [place] Office: [phone] Fax: [phone]

## 2019-12-01 ENCOUNTER — Ambulatory Visit: Payer: No Typology Code available for payment source | Admitting: Cardiology

## 2019-12-01 NOTE — Progress Notes (Deleted)
Primary Physician/Referring:  Selinda Orion  Patient ID: Mary Benson, female    DOB: 1971/07/09, 49 y.o.   MRN: [id number]  No chief complaint on file.   HPI: Mary Benson  is a 49 y.o. female  with past medical history is significant for migraine with aura, hypercholesterolemia, hyperglycemia, mild obesity, mild obstructive sleep apnea by sleep evaluation in February 2020 and unable to tolerate CPAP and also restless leg syndrome, prior history of stroke in August 2018, when she presented with speech difficulty and left sided weakness and resolved in a few hours.  She also has tobacco use disorder, smokes a pack or less a day. She has had prior history of seizure disorder but has been seizure-free since 2012.  She underwent coronary CTA in June 2020 that showed mild stenosis in her LAD (<50%). Echocardiogram at that time also showed normal LVEF and grade 2 diastolic dysfunction.  Last seen 1 week ago with complaints of fatigue, generalized abdominal pain and significant back pain, also occasional atypical chest pain. She was started on Lopressor for her chest pain and occasional BP fluctuations. She has also been referred to GI.   Does not feel well today, has multiple complaints. She has increased fatigue. She is having some abdominal pain describes as pressure and discomfort. Also has loose stools. Of note was seen in ER in Nov 2020 with abdominal pain, fatigue, bloating, felt to be related to constipation and was given lactulose. Also having some back pain that is tender to touch and appears to be worsened by certain positions.   She has some intermittent episodes of dizziness, some better than her last visit. She continues to have some occasional sharp chest pain that resolves after 3-5 minutes. Makes her feel tired afterwards. Does have some issues with indigestion, tries to avoid certain foods. Blood pressure has been stable, with rare spikes in her blood pressure.   In  December 2020, she had a questionable episode of syncope that was suggestive of vasovagal etiology. She had previously worn event monitor that was without arrhythmias. She has not had any recurrence of syncope or near syncope.   She has been able to cut back on her smoking to now making a pack of cigarettes last 4 days.    Past Medical History:  Diagnosis Date  . Anxiety   . Cancer (HCC)    cervical, uterine,   . Distal radius fracture, right 02/22/2013  . History of cervical cancer   . Laceration of forehead 02/22/2013   sutures in place  . Seasonal allergies   . Seizures ([place])    last seizure > 1 yr.; seizures caused by head trauma    Past Surgical History:  Procedure Laterality Date  . ABDOMINAL HYSTERECTOMY     partial  . CESAREAN SECTION  1997  . CHOLECYSTECTOMY    . KNEE ARTHROSCOPY Right 09/2014  . LAPAROSCOPIC LYSIS OF ADHESIONS  01/31/2006  . OPEN REDUCTION INTERNAL FIXATION (ORIF) DISTAL RADIAL FRACTURE Right 02/24/2013   Procedure: OPEN REDUCTION INTERNAL FIXATION (ORIF) RIGHT DISTAL RADIUS FRACTURE;  Surgeon: Jolyn Nap, MD;  Location: [facility];  Service: Orthopedics;  Laterality: Right;    Social History   Socioeconomic History  . Marital status: Single    Spouse name: Not on file  . Number of children: 1  . Years of education: 8  . Highest education level: Not on file  Occupational History  . Occupation: [employer]  Tobacco Use  .  Smoking status: Current Every Day Smoker    Packs/day: 1.00    Years: 24.00    Pack years: 24.00    Types: Cigarettes  . Smokeless tobacco: Never Used  Substance and Sexual Activity  . Alcohol use: Yes    Alcohol/week: 0.0 standard drinks    Comment: occasionally  . Drug use: No  . Sexual activity: Not on file  Other Topics Concern  . Not on file  Social History Narrative   Lives with fiance, Mary Benson   Caffeine use: 2 cups coffee per day   1-2 Mt dew per day   Right handed   Social  Determinants of Health   Financial Resource Strain:   . Difficulty of Paying Living Expenses: Not on file  Food Insecurity:   . Worried About Charity fundraiser in the Last Year: Not on file  . Ran Out of Food in the Last Year: Not on file  Transportation Needs:   . Lack of Transportation (Medical): Not on file  . Lack of Transportation (Non-Medical): Not on file  Physical Activity:   . Days of Exercise per Week: Not on file  . Minutes of Exercise per Session: Not on file  Stress:   . Feeling of Stress : Not on file  Social Connections:   . Frequency of Communication with Friends and Family: Not on file  . Frequency of Social Gatherings with Friends and Family: Not on file  . Attends Religious Services: Not on file  . Active Member of Clubs or Organizations: Not on file  . Attends Archivist Meetings: Not on file  . Marital Status: Not on file  Intimate Partner Violence:   . Fear of Current or Ex-Partner: Not on file  . Emotionally Abused: Not on file  . Physically Abused: Not on file  . Sexually Abused: Not on file    Current Outpatient Medications on File Prior to Visit  Medication Sig Dispense Refill  . aspirin EC 81 MG tablet Take 1 tablet (81 mg total) by mouth daily. (Patient taking differently: Take 81 mg by mouth daily as needed for moderate pain. ) 150 tablet 2  . cetirizine (ZYRTEC) 10 MG tablet Take by mouth as needed.    . furosemide (LASIX) 20 MG tablet Take 1 tablet (20 mg total) by mouth 2 (two) times daily. 30 tablet 4  . gabapentin (NEURONTIN) 100 MG capsule daily as needed.    . metoprolol tartrate (LOPRESSOR) 25 MG tablet Take 1 tablet (25 mg total) by mouth 2 (two) times daily. 60 tablet 1  . nitroGLYCERIN (NITROSTAT) 0.4 MG SL tablet Place 1 tablet (0.4 mg total) under the tongue every 5 (five) minutes as needed for chest pain. Not to exceed 3 15 tablet 1  . pantoprazole (PROTONIX) 40 MG tablet Take 1 tablet (40 mg total) by mouth daily. 30 tablet  1  . rosuvastatin (CRESTOR) 10 MG tablet Take by mouth daily.    . sertraline (ZOLOFT) 25 MG tablet TAKE 2 TABLETS BY MOUTH EVERY DAY    . spironolactone (ALDACTONE) 25 MG tablet Take 25 mg by mouth daily.    Marland Kitchen topiramate (TOPAMAX) 25 MG tablet 2 (two) times a day.     No current facility-administered medications on file prior to visit.    Review of Systems  Constitution: Positive for weight gain. Negative for chills, decreased appetite and malaise/fatigue.  Cardiovascular: Positive for chest pain (occasional) and dyspnea on exertion (stable). Negative for leg swelling, orthopnea, paroxysmal  nocturnal dyspnea and syncope.  Respiratory: Positive for sleep disturbances due to breathing (mild sleep apnea and restless leg. Not on CPAP). Negative for cough.   Endocrine: Negative for cold intolerance.  Hematologic/Lymphatic: Does not bruise/bleed easily.  Skin: Negative for unusual hair distribution.  Musculoskeletal: Positive for back pain. Negative for joint swelling.  Gastrointestinal: Positive for bloating, abdominal pain and heartburn. Negative for anorexia, change in bowel habit, hematochezia and melena.  Neurological: Negative for headaches and light-headedness. Dizziness: intermittent.  Psychiatric/Behavioral: Positive for depression. Negative for substance abuse. The patient is nervous/anxious.   All other systems reviewed and are negative.     Objective  There were no vitals taken for this visit. There is no height or weight on file to calculate BMI.   No data found.    Physical Exam  Constitutional: She is oriented to person, place, and time. Vital signs are normal. She appears well-developed and well-nourished.  HENT:  Head: Normocephalic and atraumatic.  Eyes: Conjunctivae are normal.  Cardiovascular: Normal rate, regular rhythm, normal heart sounds and intact distal pulses.  Pulmonary/Chest: Effort normal and breath sounds normal. No accessory muscle usage. No respiratory  distress.  Abdominal: Soft. Bowel sounds are normal.  Musculoskeletal:        General: Normal range of motion.     Cervical back: Normal range of motion and neck supple.     Thoracic back: Tenderness present.       Back:  Neurological: She is alert and oriented to person, place, and time.  Skin: Skin is warm and dry.  Psychiatric: She has a normal mood and affect.  Vitals reviewed.  Laboratory examination:   Labs 01/22/2019: CBC normal, serum glucose 160 mg, nonfasting.  BUN 21, creatinine 0.92, eGFR greater than 61, CMP normal.  Lipid profile 04/07/2018: Total cholesterol 141, triglycerides 149, HDL 37, LDL 74.  TSH normal.  CMP Latest Ref Rng & Units 06/17/2019 06/01/2019 06/08/2017  Glucose 65 - 99 mg/dL 111(H) 93 115(H)  BUN 6 - 24 mg/dL _0 Creatinine 0.57 - 1.00 mg/dL 0.91 0.93 0.82  Sodium 134 - 144 mmol/L 139 140 138  Potassium 3.5 - 5.2 mmol/L 3.9 4.4 3.5  Chloride 96 - 106 mmol/L 101 108 103  CO2 20 - 29 mmol/L 22 16(L) 25  Calcium 8.7 - 10.2 mg/dL 9.4 8.9 8.9  Total Protein 6.5 - 8.1 g/dL - - 7.4  Total Bilirubin 0.3 - 1.2 mg/dL - - 0.5  Alkaline Phos 38 - 126 U/L - - 77  AST 15 - 41 U/L - - 19  ALT 14 - 54 U/L - - 18   CBC Latest Ref Rng & Units 06/08/2017 02/21/2017 02/21/2017  WBC 4.0 - 10.5 K/uL 16.2(H) - 12.6(H)  Hemoglobin 12.0 - 15.0 g/dL 14.7 15.0 15.3(H)  Hematocrit 36.0 - 46.0 % 41.3 44.0 42.9  Platelets 150 - 400 K/uL 346 - 343   Cardiac Studies:   Holter Monitor for 48 hours  02/01/2019: No symptoms reported.  There are occasional PACs.  Uneventful Holter monitor.  Coronary CTA 03/02/2019: Non-cardiac:  Normal. Pulmonary veins drain normally to the left atrium. Calcium Score: 35 Agatston units. Coronary Arteries: Right dominant with no anomalies  LM: No plaque or stenosis. LAD system: Mixed plaque proximal LAD, mild (<50%) stenosis. Circumflex system: Calcified plaque proximal LCx, no significant stenosis. RCA system: No plaque or  stenosis.  IMPRESSION: 1. Coronary artery calcium score 35 Agatston units. This places the patient in the [place] for  age and gender, suggesting high risk for future cardiac events. 2.  Mild stenosis in the proximal LAD.  Echocardiogram 02/12/2019: Normal LV systolic function with EF 55%. Left ventricle cavity is normal in size. Mild concentric hypertrophy of the left ventricle. Normal global wall motion. Doppler evidence of grade II (pseudonormal) diastolic dysfunction, elevated LAP. Calculated EF 55%. Trace aortic regurgitation. Mild tricuspid regurgitation. Estimated pulmonary artery systolic pressure 26 mmHg. IVC is dilated with respiratory variation. Estimated RA pressure 8 mmHg.  Assessment   Atherosclerosis of native coronary artery of native heart with stable angina pectoris (HCC)  Generalized abdominal pain   EKG 11/24/2019: Normal sinus rhythm at 92 bpm, normal axis, no evidence of ischemia.   Recommendations:   Patient is here for 8 week follow up. She has not had any recurrence of syncope or near syncope. She does have some occasional intermittent dizziness with sudden position changes, not noted to be orthostatic. She does continue to have occasional episodes of chest pain that in the past have been relieved by nitroglycerin; however, she has not recently used nitroglycerin and feels may be related to GERD. She does have mild LAD stenosis by previous coronary CTA and is high risk for future coronary events. Although her symptoms may be related to GERD, given her risk, will also medically treat her coronary disease. I will start her on Metoprolol tartrate 25 mg BID. Use nitroglycerin as needed. Encouraged her to use Aspirin 81 mg daily. Continue with statin therapy. She has not had a recent lipid panel, will obtain this for further risk stratification. I will also start her on pantoprazole 40 mg daily. Will place GI referral given her abdominal complaints and GERD.      In regard to diastolic heart failure, her dyspnea has been stable. Leg edema is also stable. Will continue with lasix daily and Aldactone. Would recommend continued dietary changes to help promote weight loss.  I have congratulated her on recently being able to cut back on tobacco use. Encouraged her to work towards complete smoking cessation. She continues to wish to hold off on medication at this time. I will continue to monitor her progress. I will see her back in 1 week for close follow up on her symptoms. Advised her to contact me for any worsening symptoms or to proceed to the ER.  Miquel Dunn, MSN, APRN, FNP-C [facility]. [place] Office: [phone] Fax: [phone]

## 2019-12-06 ENCOUNTER — Encounter: Payer: Self-pay | Admitting: Cardiology

## 2019-12-06 ENCOUNTER — Other Ambulatory Visit: Payer: Self-pay

## 2019-12-06 ENCOUNTER — Ambulatory Visit: Payer: No Typology Code available for payment source | Admitting: Cardiology

## 2019-12-06 VITALS — BP 123/82 | HR 77 | Temp 98.5°F | Ht 61.0 in | Wt 194.0 lb

## 2019-12-06 DIAGNOSIS — Z720 Tobacco use: Secondary | ICD-10-CM

## 2019-12-06 DIAGNOSIS — E782 Mixed hyperlipidemia: Secondary | ICD-10-CM

## 2019-12-06 DIAGNOSIS — R072 Precordial pain: Secondary | ICD-10-CM

## 2019-12-06 DIAGNOSIS — Z6836 Body mass index (BMI) 36.0-36.9, adult: Secondary | ICD-10-CM

## 2019-12-06 DIAGNOSIS — G4733 Obstructive sleep apnea (adult) (pediatric): Secondary | ICD-10-CM

## 2019-12-06 DIAGNOSIS — I2510 Atherosclerotic heart disease of native coronary artery without angina pectoris: Secondary | ICD-10-CM

## 2019-12-06 NOTE — Progress Notes (Signed)
Primary Physician/Referring:  Selinda Orion  Patient ID: Mary Benson, female    DOB: 1970-12-31, 48 y.o.   MRN: [id number]  Chief Complaint  Patient presents with  . Coronary Artery Disease    folllow up  . Chest Pain    HPI: Mary Benson  is a 49 y.o. female  with past medical history is significant for migraine with aura, hypercholesterolemia, obesity, mild obstructive sleep apnea, prior history of stroke in 2018, active tobacco use, and nonobstructive coronary artery disease per coronary CTA presents to the office with a chief complaint of chest discomfort.   Chest discomfort: Patient has history of nonobstructive coronary artery disease per recent coronary CTA performed in June 2020.  Patient was started on beta-blocker therapy at the last office visit and has tolerated the medication well without any side effects or intolerances.  Patient also has sublingual nitroglycerin tablets to use on as needed basis and has not required them since last office visit.  Patient continues to be on aspirin 81 mg p.o. daily without any intolerances or side effects.  Patient states that the chest discomfort is improving but not resolved.  The discomfort is nonexertional, nonsubsternal, not brought on with stress.  Patient states that she just feels just tired and fatigued, wants to take more naps on a regular basis, does snore at night, no orthopnea paroxysmal nocturnal dyspnea or lower extremity swelling. Echocardiogram at that time also showed normal LVEF and grade 2 diastolic dysfunction.    History of obstructive sleep apnea: Per electronic medical records she was noted to have a prior history of OSA and was on CPAP therapy.  But she was unable to tolerate CPAP secondary to nosebleed and feeling uncomfortable with the appliances.  Therefore she just returned the CPAP machine and no longer is on therapy for it.  She does not follow-up with anybody on a regular basis for alternative  options to treat her sleep apnea.  Past Medical History:  Diagnosis Date  . Anxiety   . Cancer (HCC)    cervical, uterine,   . Distal radius fracture, right 02/22/2013  . History of cervical cancer   . Laceration of forehead 02/22/2013   sutures in place  . Seasonal allergies   . Seizures ([place])    last seizure > 1 yr.; seizures caused by head trauma    Past Surgical History:  Procedure Laterality Date  . ABDOMINAL HYSTERECTOMY     partial  . CESAREAN SECTION  1997  . CHOLECYSTECTOMY    . KNEE ARTHROSCOPY Right 09/2014  . LAPAROSCOPIC LYSIS OF ADHESIONS  01/31/2006  . OPEN REDUCTION INTERNAL FIXATION (ORIF) DISTAL RADIAL FRACTURE Right 02/24/2013   Procedure: OPEN REDUCTION INTERNAL FIXATION (ORIF) RIGHT DISTAL RADIUS FRACTURE;  Surgeon: Jolyn Nap, MD;  Location: [place];  Service: Orthopedics;  Laterality: Right;    Social History   Socioeconomic History  . Marital status: Single    Spouse name: Not on file  . Number of children: 1  . Years of education: 39  . Highest education level: Not on file  Occupational History  . Occupation: [employer]  Tobacco Use  . Smoking status: Current Every Day Smoker    Packs/day: 1.00    Years: 24.00    Pack years: 24.00    Types: Cigarettes  . Smokeless tobacco: Never Used  Substance and Sexual Activity  . Alcohol use: Yes    Alcohol/week: 0.0 standard drinks    Comment:  occasionally  . Drug use: No  . Sexual activity: Not on file  Other Topics Concern  . Not on file  Social History Narrative   Lives with fiance, [name]   Caffeine use: 2 cups coffee per day   1-2 Mt dew per day   Right handed   Social Determinants of Health   Financial Resource Strain:   . Difficulty of Paying Living Expenses: Not on file  Food Insecurity:   . Worried About Charity fundraiser in the Last Year: Not on file  . Ran Out of Food in the Last Year: Not on file  Transportation Needs:   . Lack of Transportation  (Medical): Not on file  . Lack of Transportation (Non-Medical): Not on file  Physical Activity:   . Days of Exercise per Week: Not on file  . Minutes of Exercise per Session: Not on file  Stress:   . Feeling of Stress : Not on file  Social Connections:   . Frequency of Communication with Friends and Family: Not on file  . Frequency of Social Gatherings with Friends and Family: Not on file  . Attends Religious Services: Not on file  . Active Member of Clubs or Organizations: Not on file  . Attends Archivist Meetings: Not on file  . Marital Status: Not on file  Intimate Partner Violence:   . Fear of Current or Ex-Partner: Not on file  . Emotionally Abused: Not on file  . Physically Abused: Not on file  . Sexually Abused: Not on file    Current Outpatient Medications on File Prior to Visit  Medication Sig Dispense Refill  . aspirin EC 81 MG tablet Take 1 tablet (81 mg total) by mouth daily. 150 tablet 2  . cetirizine (ZYRTEC) 10 MG tablet Take by mouth as needed.    . furosemide (LASIX) 20 MG tablet Take 1 tablet (20 mg total) by mouth 2 (two) times daily. 30 tablet 4  . gabapentin (NEURONTIN) 100 MG capsule daily as needed.    . metoprolol tartrate (LOPRESSOR) 25 MG tablet Take 1 tablet (25 mg total) by mouth 2 (two) times daily. 60 tablet 1  . nitroGLYCERIN (NITROSTAT) 0.4 MG SL tablet Place 1 tablet (0.4 mg total) under the tongue every 5 (five) minutes as needed for chest pain. Not to exceed 3 15 tablet 1  . pantoprazole (PROTONIX) 40 MG tablet Take 1 tablet (40 mg total) by mouth daily. 30 tablet 1  . rosuvastatin (CRESTOR) 10 MG tablet Take by mouth daily.    . sertraline (ZOLOFT) 25 MG tablet TAKE 2 TABLETS BY MOUTH EVERY DAY    . spironolactone (ALDACTONE) 25 MG tablet Take 25 mg by mouth daily.    Marland Kitchen topiramate (TOPAMAX) 25 MG tablet 2 (two) times a day.     No current facility-administered medications on file prior to visit.    Review of Systems  Constitution:  Negative for chills, decreased appetite, malaise/fatigue and weight gain.  Cardiovascular: Negative for chest pain (occasional), dyspnea on exertion, leg swelling, orthopnea, paroxysmal nocturnal dyspnea and syncope.       Chest discomfort  Respiratory: Positive for sleep disturbances due to breathing (mild sleep apnea and restless leg. Not on CPAP). Negative for cough.   Endocrine: Negative for cold intolerance.  Hematologic/Lymphatic: Does not bruise/bleed easily.  Skin: Negative for unusual hair distribution.  Musculoskeletal: Negative for joint swelling.  Gastrointestinal: Negative for bloating, abdominal pain, anorexia, change in bowel habit, heartburn, hematochezia and melena.  Neurological: Negative for dizziness, headaches and light-headedness.  Psychiatric/Behavioral: Negative for depression and substance abuse. The patient is not nervous/anxious.   All other systems reviewed and are negative.     Objective  Blood pressure 123/82, pulse 77, temperature 98.5 F (36.9 C), height _0  (1.549 m), weight 194 lb (88 kg), SpO2 96 %. Body mass index is 36.66 kg/m.    Constitutional: She is oriented to person, place, and time. Vital signs are normal. She appears well-developed and well-nourished.  HENT:  Head: Normocephalic and atraumatic.  Eyes: Conjunctivae are normal.  Cardiovascular: Normal rate, regular rhythm, normal heart sounds and intact distal pulses.  Pulmonary/Chest: Effort normal and breath sounds normal. No accessory muscle usage. No respiratory distress.  Abdominal: Soft. Bowel sounds are normal.  Neurological: She is alert and oriented to person, place, and time.  Skin: Skin is warm and dry.  Psychiatric: She has a normal mood and affect.  Vitals reviewed. Laboratory examination:   Labs 01/22/2019: CBC normal, serum glucose 160 mg, nonfasting.  BUN 21, creatinine 0.92, eGFR greater than 61, CMP normal.  Lipid profile 04/07/2018: Total cholesterol 141, triglycerides  149, HDL 37, LDL 74.  TSH normal.  CMP Latest Ref Rng & Units 06/17/2019 06/01/2019 06/08/2017  Glucose 65 - 99 mg/dL 111(H) 93 115(H)  BUN 6 - 24 mg/dL _1 Creatinine 0.57 - 1.00 mg/dL 0.91 0.93 0.82  Sodium 134 - 144 mmol/L 139 140 138  Potassium 3.5 - 5.2 mmol/L 3.9 4.4 3.5  Chloride 96 - 106 mmol/L 101 108 103  CO2 20 - 29 mmol/L 22 16(L) 25  Calcium 8.7 - 10.2 mg/dL 9.4 8.9 8.9  Total Protein 6.5 - 8.1 g/dL - - 7.4  Total Bilirubin 0.3 - 1.2 mg/dL - - 0.5  Alkaline Phos 38 - 126 U/L - - 77  AST 15 - 41 U/L - - 19  ALT 14 - 54 U/L - - 18   CBC Latest Ref Rng & Units 06/08/2017 02/21/2017 02/21/2017  WBC 4.0 - 10.5 K/uL 16.2(H) - 12.6(H)  Hemoglobin 12.0 - 15.0 g/dL 14.7 15.0 15.3(H)  Hematocrit 36.0 - 46.0 % 41.3 44.0 42.9  Platelets 150 - 400 K/uL 346 - 343   Cardiac Studies:   Holter Monitor for 48 hours  02/01/2019: No symptoms reported.  There are occasional PACs.  Uneventful Holter monitor.  Coronary CTA 03/02/2019: Non-cardiac:  Normal. Pulmonary veins drain normally to the left atrium. Calcium Score: 35 Agatston units. Coronary Arteries: Right dominant with no anomalies  LM: No plaque or stenosis. LAD system: Mixed plaque proximal LAD, mild (<50%) stenosis. Circumflex system: Calcified plaque proximal LCx, no significant stenosis. RCA system: No plaque or stenosis.  IMPRESSION: 1. Coronary artery calcium score 35 Agatston units. This places the patient in the 98th percentile for age and gender, suggesting high risk for future cardiac events. 2.  Mild stenosis in the proximal LAD.  Echocardiogram 02/12/2019: Normal LV systolic function with EF 55%. Left ventricle cavity is normal in size. Mild concentric hypertrophy of the left ventricle. Grade II diastolic dysfunction, elevated LAP. Calculated EF 55%. Trace AR. Mild TR, RVSP 29mHG,   Assessment   Precordial chest pain  Nonobstructive atherosclerosis of coronary artery  Mixed hyperlipidemia  Tobacco  abuse  OSA (obstructive sleep apnea)  Class 2 severe obesity due to excess calories with serious comorbidity and body mass index (BMI) of 36.0 to 36.9 in adult ([facility]   EKG 11/24/2019: Normal sinus rhythm at 92 bpm, normal  axis, no evidence of ischemia.   Recommendations:   Patient continues to have atypical chest discomfort which is nonexertional and chronic/stable.  She has had a thorough cardiac work-up including an echocardiogram, coronary artery calcification score, coronary CTA.  Given the patient's coronary calcification score and nonobstructive coronary artery disease educated on importance of aggressively improving her modifiable cardiovascular risk factors. Continue aspirin. Continue statin therapy. Continue beta-blocker therapy. Patient does have sublingual nitroglycerin tablets to use on as needed basis. Active tobacco use, patient educated on importance of complete cessation of nicotine use. Close follow up is recommended.  Patient is encouraged to follow-up with sleep medicine to be reevaluated for obstructive sleep apnea and to work with them to optimize the appliance for better compliance.     I have congratulated her on recently being able to cut back on tobacco use. Encouraged her to work towards complete smoking cessation. She continues to wish to hold off on medication at this time. I will continue to monitor her progress.   I will see her back in 4 week for close follow up on her symptoms. Advised her to contact me for any worsening symptoms or to proceed to the ER.  Rex Kras, DO, [facility]. [place] Office: [phone]

## 2019-12-16 ENCOUNTER — Other Ambulatory Visit: Payer: Self-pay | Admitting: Cardiology

## 2019-12-19 ENCOUNTER — Other Ambulatory Visit: Payer: Self-pay | Admitting: Cardiology

## 2019-12-29 ENCOUNTER — Encounter: Payer: Self-pay | Admitting: Physician Assistant

## 2020-01-04 ENCOUNTER — Other Ambulatory Visit: Payer: Self-pay

## 2020-01-04 ENCOUNTER — Encounter: Payer: Self-pay | Admitting: Cardiology

## 2020-01-04 ENCOUNTER — Ambulatory Visit: Payer: No Typology Code available for payment source | Admitting: Cardiology

## 2020-01-04 VITALS — BP 122/81 | HR 88 | Temp 98.5°F | Resp 16 | Ht 61.0 in | Wt 194.0 lb

## 2020-01-04 DIAGNOSIS — E782 Mixed hyperlipidemia: Secondary | ICD-10-CM

## 2020-01-04 DIAGNOSIS — G4733 Obstructive sleep apnea (adult) (pediatric): Secondary | ICD-10-CM

## 2020-01-04 DIAGNOSIS — Z8673 Personal history of transient ischemic attack (TIA), and cerebral infarction without residual deficits: Secondary | ICD-10-CM

## 2020-01-04 DIAGNOSIS — I209 Angina pectoris, unspecified: Secondary | ICD-10-CM

## 2020-01-04 DIAGNOSIS — I2510 Atherosclerotic heart disease of native coronary artery without angina pectoris: Secondary | ICD-10-CM

## 2020-01-04 DIAGNOSIS — Z720 Tobacco use: Secondary | ICD-10-CM

## 2020-01-04 MED ORDER — ISOSORBIDE MONONITRATE ER 30 MG PO TB24: 30.0000 mg | ORAL_TABLET | Freq: Every evening | ORAL | 0 refills | Status: DC

## 2020-01-04 NOTE — Patient Instructions (Signed)
Please remember to bring in your medication bottles in at the next visit.   New Medications that were added at today's visit:  Imdur 30mg  po every pm.   Medications that were discontinued at today's visit: Lasix   Office will call you to have the following tests scheduled:  Stress test   Recommend follow up with your PCP as scheduled.

## 2020-01-04 NOTE — Progress Notes (Signed)
Primary Physician/Referring:  Selinda Orion  Patient ID: Mary Benson, female    DOB: 1971-06-30, 49 y.o.   MRN: [id number]  Chief Complaint  Patient presents with  . Follow-up    4 week    HPI: Mary Benson  is a 49 y.o. female  with past medical history is significant for migraine with aura, hypercholesterolemia, obesity, mild obstructive sleep apnea, prior history of stroke in 2018, active tobacco use, and nonobstructive coronary artery disease per coronary CTA presents to the office with a chief complaint of "chest discomfort".   Chest discomfort: Patient has history of nonobstructive coronary artery disease per recent coronary CTA performed in June 2020.  Patient was started on beta-blocker therapy and has tolerated the medication well without any side effects or intolerances.  Patient also has sublingual nitroglycerin tablets to use on as needed basis and has not required them since last office visit.  Patient continues to be on aspirin 81 mg p.o. daily. Patient states that the chest discomfort is improving but not resolved.  Patient states that before she was walking half a mile at max.  However, recently she has been walking 1.5 miles without any effort related symptoms.  However, if she goes more than 1.5 miles she does have chest discomfort which she describes as tightness-like sensation in the substernal region lasting for 7 to 8 minutes, eases off with resting and relaxing, more prominent during the midday and nighttime.    Echocardiogram at that time also showed normal LVEF and grade 2 diastolic dysfunction.    In the interim, patient is also stopped taking Lasix.   Patient now smokes 0.25 packs/day.  She has intentions to stop smoking by the time she sees me at the next office visit.  Since last office visit, no hospitalizations or urgent care visits for cardiovascular symptoms  Past Medical History:  Diagnosis Date  . Anxiety   . Cancer (HCC)     cervical, uterine,   . Distal radius fracture, right 02/22/2013  . History of cervical cancer   . Laceration of forehead 02/22/2013   sutures in place  . Seasonal allergies   . 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 ABDOMINAL HYSTERECTOMY     partial  . CESAREAN SECTION  1997  . CHOLECYSTECTOMY    . KNEE ARTHROSCOPY Right 09/2014  . LAPAROSCOPIC LYSIS OF ADHESIONS  01/31/2006  . OPEN REDUCTION INTERNAL FIXATION (ORIF) DISTAL RADIAL FRACTURE Right 02/24/2013   Procedure: OPEN REDUCTION INTERNAL FIXATION (ORIF) RIGHT DISTAL RADIUS FRACTURE;  Surgeon: Jolyn Nap, MD;  Location: [facility];  Service: Orthopedics;  Laterality: Right;    Social History   Socioeconomic History  . Marital status: Single    Spouse name: Not on file  . Number of children: 1  . Years of education: 79  . Highest education level: Not on file  Occupational History  . Occupation: [employer]  Tobacco Use  . Smoking status: Current Every Day Smoker    Packs/day: 1.00    Years: 24.00    Pack years: 24.00    Types: Cigarettes  . Smokeless tobacco: Never Used  Substance and Sexual Activity  . Alcohol use: Yes    Alcohol/week: 0.0 standard drinks    Comment: occasionally  . Drug use: No  . Sexual activity: Not on file  Other Topics Concern  .  Not on file  Social History Narrative   Lives with fiance, Mary Benson   Caffeine use: 2 cups coffee per day   1-2 Mt dew per day   Right handed   Social Determinants of Health   Financial Resource Strain:   . Difficulty of Paying Living Expenses:   Food Insecurity:   . Worried About Charity fundraiser in the Last Year:   . Arboriculturist in the Last Year:   Transportation Needs:   . Film/video editor (Medical):   Marland Kitchen Lack of Transportation (Non-Medical):   Physical Activity:   . Days of Exercise per Week:   . Minutes of Exercise per Session:     Stress:   . Feeling of Stress :   Social Connections:   . Frequency of Communication with Friends and Family:   . Frequency of Social Gatherings with Friends and Family:   . Attends Religious Services:   . Active Member of Clubs or Organizations:   . Attends Archivist Meetings:   Marland Kitchen Marital Status:   Intimate Partner Violence:   . Fear of Current or Ex-Partner:   . Emotionally Abused:   Marland Kitchen Physically Abused:   . Sexually Abused:     Current Outpatient Medications on File Prior to Visit  Medication Sig Dispense Refill  . aspirin EC 81 MG tablet Take 1 tablet (81 mg total) by mouth daily. 150 tablet 2  . cetirizine (ZYRTEC) 10 MG tablet Take by mouth as needed.    . gabapentin (NEURONTIN) 100 MG capsule daily as needed.    . metoprolol tartrate (LOPRESSOR) 25 MG tablet TAKE 1 TABLET BY MOUTH TWICE A DAY 60 tablet 1  . pantoprazole (PROTONIX) 40 MG tablet TAKE 1 TABLET BY MOUTH EVERY DAY 30 tablet 1  . rosuvastatin (CRESTOR) 10 MG tablet Take by mouth daily.    . sertraline (ZOLOFT) 25 MG tablet TAKE 2 TABLETS BY MOUTH EVERY DAY    . spironolactone (ALDACTONE) 25 MG tablet Take 25 mg by mouth daily.    Marland Kitchen topiramate (TOPAMAX) 25 MG tablet 2 (two) times a day.    . nitroGLYCERIN (NITROSTAT) 0.4 MG SL tablet Place 1 tablet (0.4 mg total) under the tongue every 5 (five) minutes as needed for chest pain. Not to exceed 3 15 tablet 1   No current facility-administered medications on file prior to visit.    Review of Systems  Constitution: Negative for chills, decreased appetite, malaise/fatigue and weight gain.  Cardiovascular: Positive for chest pain (occasional). Negative for dyspnea on exertion, leg swelling, orthopnea, paroxysmal nocturnal dyspnea and syncope.       Chest discomfort  Respiratory: Negative for cough.   Endocrine: Negative for cold intolerance.  Hematologic/Lymphatic: Does not bruise/bleed easily.  Skin: Negative for unusual hair distribution.   Musculoskeletal: Negative for joint swelling.  Gastrointestinal: Negative for bloating, abdominal pain, anorexia, change in bowel habit, heartburn, hematochezia and melena.  Neurological: Negative for dizziness, headaches and light-headedness.  Psychiatric/Behavioral: Negative for depression and substance abuse. The patient is not nervous/anxious.   All other systems reviewed and are negative.     Objective  Blood pressure 122/81, pulse 88, temperature 98.5 F (36.9 C), temperature source Temporal, resp. rate 16, height 5' 1"  (1.549 m), weight 194 lb (88 kg), SpO2 98 %. Body mass index is 36.66 kg/m.    Constitutional: She is oriented to person, place, and time. Vital signs are normal. She appears well-developed and well-nourished.  HENT:  Head:  Normocephalic and atraumatic.  Eyes: Conjunctivae are normal.  Cardiovascular: Normal rate, regular rhythm, normal heart sounds and intact distal pulses.  Pulmonary/Chest: Effort normal and breath sounds normal. No accessory muscle usage. No respiratory distress.  Abdominal: Soft. Bowel sounds are normal.  Neurological: She is alert and oriented to person, place, and time.  Skin: Skin is warm and dry.  Psychiatric: She has a normal mood and affect.  Vitals reviewed. Laboratory examination:   Labs 01/22/2019: CBC normal, serum glucose 160 mg, nonfasting.  BUN 21, creatinine 0.92, eGFR greater than 61, CMP normal.  Lipid profile 04/07/2018: Total cholesterol 141, triglycerides 149, HDL 37, LDL 74.  TSH normal.  CMP Latest Ref Rng & Units 06/17/2019 06/01/2019 06/08/2017  Glucose 65 - 99 mg/dL 111(H) 93 115(H)  BUN 6 - 24 mg/dL 17 18 16   Creatinine 0.57 - 1.00 mg/dL 0.91 0.93 0.82  Sodium 134 - 144 mmol/L 139 140 138  Potassium 3.5 - 5.2 mmol/L 3.9 4.4 3.5  Chloride 96 - 106 mmol/L 101 108 103  CO2 20 - 29 mmol/L 22 16(L) 25  Calcium 8.7 - 10.2 mg/dL 9.4 8.9 8.9  Total Protein 6.5 - 8.1 g/dL - - 7.4  Total Bilirubin 0.3 - 1.2 mg/dL - - 0.5   Alkaline Phos 38 - 126 U/L - - 77  AST 15 - 41 U/L - - 19  ALT 14 - 54 U/L - - 18   CBC Latest Ref Rng & Units 06/08/2017 02/21/2017 02/21/2017  WBC 4.0 - 10.5 K/uL 16.2(H) - 12.6(H)  Hemoglobin 12.0 - 15.0 g/dL 14.7 15.0 15.3(H)  Hematocrit 36.0 - 46.0 % 41.3 44.0 42.9  Platelets 150 - 400 K/uL 346 - 343   Cardiac Studies:   Holter Monitor for 48 hours  02/01/2019: No symptoms reported.  There are occasional PACs.  Uneventful Holter monitor.  Coronary CTA 03/02/2019: Non-cardiac:  Normal. Pulmonary veins drain normally to the left atrium. Calcium Score: 35 Agatston units. Coronary Arteries: Right dominant with no anomalies  LM: No plaque or stenosis. LAD system: Mixed plaque proximal LAD, mild (<50%) stenosis. Circumflex system: Calcified plaque proximal LCx, no significant stenosis. RCA system: No plaque or stenosis.  IMPRESSION: 1. Coronary artery calcium score 35 Agatston units. This places the patient in the 98th percentile for age and gender, suggesting high risk for future cardiac events. 2.  Mild stenosis in the proximal LAD.  Echocardiogram 02/12/2019: Normal LV systolic function with EF 55%. Left ventricle cavity is normal in size. Mild concentric hypertrophy of the left ventricle. Grade II diastolic dysfunction, elevated LAP. Calculated EF 55%. Trace AR. Mild TR, RVSP 40mHG,   Assessment   Angina pectoris (HCC) - Plan: PCV MYOCARDIAL PERFUSION WITH LEXISCAN, isosorbide mononitrate (IMDUR) 30 MG 24 hr tablet  Coronary artery calcification seen on computed tomography - Plan: PCV MYOCARDIAL PERFUSION WITH LEXISCAN, isosorbide mononitrate (IMDUR) 30 MG 24 hr tablet  Nonobstructive atherosclerosis of coronary artery - Plan: PCV MYOCARDIAL PERFUSION WITH LEXISCAN, isosorbide mononitrate (IMDUR) 30 MG 24 hr tablet  Mixed hyperlipidemia  Tobacco abuse  OSA (obstructive sleep apnea)  Class 2 severe obesity due to excess calories with serious comorbidity and body  mass index (BMI) of 36.0 to 36.9 in adult ([facility]  H/O: CVA (cerebrovascular accident) no residual deficit 2018   EKG 11/24/2019: Normal sinus rhythm at 92 bpm, normal axis, no evidence of ischemia.   Recommendations:   Angina pectoris:  Patient describes her chest discomfort last tightness-like sensation in the substernal region, usually brought  on by overexertion and either self-limited or improves with resting.  This has been chronic and stable.  She has had a thorough cardiac work-up including an echocardiogram, coronary artery calcification score, coronary CTA.  Given the patient's coronary calcification score and nonobstructive coronary artery disease educated on importance of aggressively improving her modifiable cardiovascular risk factors.  Continue aspirin.  Continue statin therapy.  Continue beta-blocker therapy.  Patient does have sublingual nitroglycerin tablets to use on as needed basis.  Active tobacco use, patient educated on importance of complete cessation of nicotine use.  She is down to 0.25 packs/day with intentions of stopping prior to the next office visit.  Patient has discontinued Lasix.  We will start Imdur 30 mg p.o. daily.  Prescription provided  Nuclear stress test recommended to evaluate for reversible ischemia.  Close follow up is recommended.  In the interim if her symptoms increase in intensity, frequency, and/or duration patient is instructed to seek medical attention at the closest ER via EMS.  Patient verbalized understanding and provides verbal feedback.   Orders Placed This Encounter  Procedures  . PCV MYOCARDIAL PERFUSION WITH LEXISCAN   --Continue cardiac medications as reconciled in final medication list. --Return in about 3 months (around 04/04/2020) for follow up on stress test and re-evaluate the symptoms. . Or sooner if needed or abnormal stress test. --Continue follow-up with your primary care physician regarding the management of your  other chronic comorbid conditions.  Patient's questions and concerns were addressed to her satisfaction. She voices understanding of the instructions provided during this encounter.   This note was created using a voice recognition software as a result there may be grammatical errors inadvertently enclosed that do not reflect the nature of this encounter. Every attempt is made to correct such errors.  Rex Kras, [place], [facility]  Pager: [phone] Office: [phone]

## 2020-01-10 ENCOUNTER — Ambulatory Visit: Payer: No Typology Code available for payment source

## 2020-01-10 ENCOUNTER — Other Ambulatory Visit: Payer: Self-pay

## 2020-01-10 DIAGNOSIS — I209 Angina pectoris, unspecified: Secondary | ICD-10-CM

## 2020-01-10 DIAGNOSIS — I2510 Atherosclerotic heart disease of native coronary artery without angina pectoris: Secondary | ICD-10-CM

## 2020-01-12 ENCOUNTER — Ambulatory Visit: Payer: No Typology Code available for payment source | Admitting: Physician Assistant

## 2020-01-19 ENCOUNTER — Other Ambulatory Visit: Payer: Self-pay | Admitting: Cardiology

## 2020-03-06 ENCOUNTER — Other Ambulatory Visit: Payer: Self-pay | Admitting: Cardiology

## 2020-03-06 DIAGNOSIS — R0609 Other forms of dyspnea: Secondary | ICD-10-CM

## 2020-03-07 LAB — BASIC METABOLIC PANEL
BUN/Creatinine Ratio: 21 (ref 9–23)
BUN: 18 mg/dL (ref 6–24)
CO2: 22 mmol/L (ref 20–29)
Calcium: 9.4 mg/dL (ref 8.7–10.2)
Chloride: 106 mmol/L (ref 96–106)
Creatinine, Ser: 0.87 mg/dL (ref 0.57–1.00)
GFR calc Af Amer: 92 mL/min/{1.73_m2} (ref >59)
GFR calc non Af Amer: 80 mL/min/{1.73_m2} (ref >59)
Glucose: 135 mg/dL — ABNORMAL HIGH (ref 65–99)
Potassium: 3.8 mmol/L (ref 3.5–5.2)
Sodium: 144 mmol/L (ref 134–144)

## 2020-03-07 LAB — PRO B NATRIURETIC PEPTIDE: NT-Pro BNP: 146 pg/mL (ref 0–249)

## 2020-03-07 LAB — MAGNESIUM: Magnesium: 2 mg/dL (ref 1.6–2.3)

## 2020-03-09 ENCOUNTER — Ambulatory Visit: Payer: No Typology Code available for payment source | Admitting: Cardiology

## 2020-03-09 ENCOUNTER — Encounter: Payer: Self-pay | Admitting: Cardiology

## 2020-03-09 ENCOUNTER — Other Ambulatory Visit: Payer: Self-pay

## 2020-03-09 VITALS — BP 120/96 | HR 93 | Resp 16 | Ht 61.0 in | Wt 197.0 lb

## 2020-03-09 DIAGNOSIS — Z8673 Personal history of transient ischemic attack (TIA), and cerebral infarction without residual deficits: Secondary | ICD-10-CM

## 2020-03-09 DIAGNOSIS — E782 Mixed hyperlipidemia: Secondary | ICD-10-CM

## 2020-03-09 DIAGNOSIS — I2510 Atherosclerotic heart disease of native coronary artery without angina pectoris: Secondary | ICD-10-CM

## 2020-03-09 DIAGNOSIS — G4733 Obstructive sleep apnea (adult) (pediatric): Secondary | ICD-10-CM

## 2020-03-09 DIAGNOSIS — Z720 Tobacco use: Secondary | ICD-10-CM

## 2020-03-09 DIAGNOSIS — I209 Angina pectoris, unspecified: Secondary | ICD-10-CM

## 2020-03-09 NOTE — Progress Notes (Signed)
Mary Benson Date of Birth: 02-Apr-1971 MRN: [id number] Primary Care Provider:Spencer, Domingo Pulse, PA-C Former Cardiology Providers: Jeri Lager, APRN, FNP-C, Dr. Adrian Prows Primary Cardiologist: Rex Kras, DO, [facility] (established care 12/06/2019)  Date: 03/09/20 Last Visit: 01/04/2020  Chief Complaint  Patient presents with   Follow-up    2 month    HPI  Mary Benson is a 49 y.o.  female who presents to the office with a chief complaint of " reevaluation of chest pain." Patient's past medical history and cardiovascular risk factors include: migraine with aura, hypercholesterolemia, obesity, mild obstructive sleep apnea, prior history of stroke in 2018, active tobacco use, and nonobstructive coronary artery disease per coronary CTA.   Patient has history of nonobstructive coronary artery disease seen on coronary CTA back in June 2020.  Patient medical therapy includes beta-blocker, Imdur, and sublingual nitroglycerin tablets as needed.  Since last office visit patient has not required sublingual nitroglycerin tablets.  But she has presented to the office several times in the past with complains of chest discomfort.  At last office visit she was recommended she undergo nuclear stress test to evaluate for reversible ischemia.  Stress test that was performed in April 2021 showed normal myocardial perfusion and no evidence of regional wall motion abnormalities on gated SPECT.  Her TID ratio was noted to be 1.3.   Since last office visit patient states that she continues to feel " chest tightness as if air is being cut off."  The pain is located substernally and also around the throat.  She has episodes of chest discomfort at least 4 times a day and does not know exactly if its gas pain are cardiac related.  The pain is also brought on by effort related activities and at times improves with rest.  She was responded well to Imdur that was started at the last office visit.  Patient did not  take sublingual nitroglycerin tablets as it causes a headache.   Clinically patient is not in congestive heart failure.  Laboratory values reviewed.  She continues to smoke around quarter of a pack per day despite being educated multiple times in regards to the importance of smoking cessation.  ALLERGIES: Allergies  Allergen Reactions   Hydrocodone Other (See Comments)    ABD. PAIN   Penicillins Hives     MEDICATION LIST PRIOR TO VISIT: Current Outpatient Medications on File Prior to Visit  Medication Sig Dispense Refill   aspirin EC 81 MG tablet Take 1 tablet (81 mg total) by mouth daily. (Patient taking differently: Take 81 mg by mouth 3 (three) times a week. ) 150 tablet 2   cetirizine (ZYRTEC) 10 MG tablet Take 10 mg by mouth daily as needed for allergies.      furosemide (LASIX) 20 MG tablet TAKE 1 TABLET BY MOUTH TWICE A DAY (Patient taking differently: Take 20 mg by mouth 2 (two) times daily. ) 180 tablet 0   isosorbide mononitrate (IMDUR) 30 MG 24 hr tablet Take 1 tablet (30 mg total) by mouth every evening. 90 tablet 0   metoprolol tartrate (LOPRESSOR) 25 MG tablet TAKE 1 TABLET BY MOUTH TWICE A DAY (Patient taking differently: Take 25 mg by mouth 2 (two) times daily. ) 180 tablet 0   pantoprazole (PROTONIX) 40 MG tablet TAKE 1 TABLET BY MOUTH EVERY DAY (Patient taking differently: Take 40 mg by mouth daily. ) 90 tablet 0   rosuvastatin (CRESTOR) 10 MG tablet Take 10 mg by mouth daily.  sertraline (ZOLOFT) 25 MG tablet Take 25 mg by mouth 2 (two) times daily as needed (anxiety).      spironolactone (ALDACTONE) 25 MG tablet Take 25 mg by mouth daily.     topiramate (TOPAMAX) 25 MG tablet Take 25 mg by mouth 2 (two) times daily as needed (headaches).      No current facility-administered medications on file prior to visit.    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    PAST SURGICAL HISTORY: Past Surgical History:  Procedure Laterality Date   ABDOMINAL HYSTERECTOMY     partial   CESAREAN SECTION  1997   CHOLECYSTECTOMY     KNEE ARTHROSCOPY Right 09/2014   LAPAROSCOPIC LYSIS OF ADHESIONS  01/31/2006   OPEN REDUCTION INTERNAL FIXATION (ORIF) DISTAL RADIAL FRACTURE Right 02/24/2013   Procedure: OPEN REDUCTION INTERNAL FIXATION (ORIF) RIGHT DISTAL RADIUS FRACTURE;  Surgeon: Jolyn Nap, MD;  Location: [facility];  Service: Orthopedics;  Laterality: Right;    FAMILY HISTORY: The patient's family history includes Cervical cancer in her mother; Diabetes in her brother, mother, and sister; High blood pressure in her brother, mother, and sister.   SOCIAL HISTORY:  The patient  reports that she has been smoking cigarettes. She has a 24.00 pack-year smoking history. She has never used smokeless tobacco. She reports current alcohol use. She reports that she does not use drugs.  Review of Systems  Constitutional: Positive for malaise/fatigue. Negative for chills and fever.  HENT: Negative for hoarse voice and nosebleeds.   Eyes: Negative for discharge, double vision and pain.  Cardiovascular: Positive for chest pain (chest tightness). Negative for claudication, dyspnea on exertion, leg swelling, near-syncope, orthopnea, palpitations, paroxysmal nocturnal dyspnea and syncope.  Respiratory: Positive for snoring. Negative for hemoptysis and shortness of breath.   Musculoskeletal: Negative for muscle cramps and myalgias.  Gastrointestinal: Positive for hematochezia (two episode of BRBPR with wiping and in comode). Negative for abdominal pain, constipation, diarrhea, hematemesis, melena, nausea and vomiting.  Neurological: Negative  for dizziness and light-headedness.    PHYSICAL EXAM: Vitals with BMI 03/09/2020 01/04/2020 12/06/2019  Height 5\' 1"  5\' 1"  5\' 1"   Weight 197 lbs 194 lbs 194 lbs  BMI 37.24 74.08 14.48  Systolic 185 631 497  Diastolic 96 81 82  Pulse 93 88 77  Some encounter information is confidential and restricted. Go to Review Flowsheets activity to see all data.    Constitutional: She isoriented to person, place, and time.Vital signs are normal. She appearswell-developedand well-nourished.  HENT:  Head:Normocephalicand atraumatic.  Eyes:Conjunctivaeare normal.  Cardiovascular:Normal rate,regular rhythm,normal heart soundsand intact distal pulses.  Pulmonary/Chest:Effort normaland breath sounds normal. Noaccessory muscle usage. Norespiratory distress.  Abdominal:Soft.Bowel sounds are normal.  Neurological: She isalertand oriented to person, place, and time.  Skin: Skin iswarmand dry.  Psychiatric: She has anormal mood and affect.  RADIOLOGY: Coronary CTA 03/02/2019: Non-cardiac:  Normal. Pulmonary veins drain normally to the left atrium. Calcium Score: 35 Agatston units. Coronary Arteries: Right dominant with no anomalies  LM: No plaque or stenosis. LAD system: Mixed plaque proximal LAD, mild (<50%) stenosis. Circumflex system: Calcified plaque proximal LCx, no  significant stenosis. RCA system: No plaque or stenosis.  IMPRESSION: 1. Coronary artery calcium score 35 Agatston units. This places the patient in the 98th percentile for age and gender, suggesting high risk for future cardiac events. 2. Mild stenosis in the proximal LAD.  CARDIAC DATABASE: EKG: 03/09/2020: Normal sinus rhythm, normal axis, without underlying ischemia or injury pattern.  Echocardiogram: 02/12/2019: LVEF 55%. Left ventricle cavity is normal in size. Mild concentric hypertrophy of the left ventricle. Grade II diastolic dysfunction, elevated LAP. Calculated EF 55%. Trace AR. Mild TR, RVSP  2mmHG,   Stress Testing:  Lexiscan/Modified BruceSestamibi stress test 01/10/2020: Stress LVEF 62%. Normal myocardial perfusion. TID ratio 1.31. In absence of regional myocardial perfusion defects, this is a nonspecific finding. Clinical correaltion. Low risk study.   Heart Catheterization: None  Carotid duplex: None  Vascular imaging: Lower Extremity Arterial Duplex 01/12/2020: No hemodynamically significant stenoses are identified in the lower extremity arterial system.  There is mild diffuse disease noted in the below knee vessels with monophasic waveform at the left ankle in AT vessel.  Non compressible vessels due to medial sclerosis.  Consider evaluation for pseudoclaudication.  Holter Monitor for 48 hours  02/01/2019: No symptoms reported.  There are occasional PACs.  Uneventful Holter monitor.  LABORATORY DATA: CBC Latest Ref Rng & Units 06/08/2017 02/21/2017 02/21/2017  WBC 4.0 - 10.5 K/uL 16.2(H) - 12.6(H)  Hemoglobin 12.0 - 15.0 g/dL 14.7 15.0 15.3(H)  Hematocrit 36 - 46 % 41.3 44.0 42.9  Platelets 150 - 400 K/uL 346 - 343    CMP Latest Ref Rng & Units 03/06/2020 06/17/2019 06/01/2019  Glucose 65 - 99 mg/dL 135(H) 111(H) 93  BUN 6 - 24 mg/dL 18 17 18   Creatinine 0.57 - 1.00 mg/dL 0.87 0.91 0.93  Sodium 134 - 144 mmol/L 144 139 140  Potassium 3.5 - 5.2 mmol/L 3.8 3.9 4.4  Chloride 96 - 106 mmol/L 106 101 108  CO2 20 - 29 mmol/L 22 22 16(L)  Calcium 8.7 - 10.2 mg/dL 9.4 9.4 8.9  Total Protein 6.5 - 8.1 g/dL - - -  Total Bilirubin 0.3 - 1.2 mg/dL - - -  Alkaline Phos 38 - 126 U/L - - -  AST 15 - 41 U/L - - -  ALT 14 - 54 U/L - - -    Lipid Panel  No results found for: CHOL, TRIG, HDL, CHOLHDL, VLDL, LDLCALC, LDLDIRECT, LABVLDL  No results found for: HGBA1C No components found for: NTPROBNP No results found for: TSH  Cardiac Panel (last 3 results) No results for input(s): CKTOTAL, CKMB, TROPONINIHS, RELINDX in the last 72 hours.  IMPRESSION:    ICD-10-CM   1.  Angina pectoris (HCC)  I20.9 CBC    EKG 12-Lead    SARS-COV-2 RNA,(COVID-19) QUAL NAAT  2. Coronary artery calcification seen on computed tomography  I25.10 SARS-COV-2 RNA,(COVID-19) QUAL NAAT  3. Nonobstructive atherosclerosis of coronary artery  I25.10   4. Mixed hyperlipidemia  E78.2   5. Tobacco abuse  Z72.0   6. OSA (obstructive sleep apnea)  G47.33   7. Class 2 severe obesity due to excess calories with serious comorbidity and body mass index (BMI) of 36.0 to 36.9 in adult (HCC)  E66.01    Z68.36   8. H/O: CVA (cerebrovascular accident) no residual deficit 2018  Z86.73      RECOMMENDATIONS: Mary Benson is a 49 y.o. female whose past medical history and cardiovascular risk factors include: migraine with aura, hypercholesterolemia, obesity, mild obstructive sleep apnea,  prior history of stroke in 2018, active tobacco use, and nonobstructive coronary artery disease per coronary CTA.   Angina pectoris:  She has had a thorough cardiac work-up including an echocardiogram, coronary artery calcification score, coronary CTA.  Given the patient's coronary calcification score and nonobstructive coronary artery disease educated on importance of aggressively improving her modifiable cardiovascular risk factors.  Continue aspirin.  Continue statin therapy.  Continue beta-blocker therapy.  Patient does have sublingual nitroglycerin tablets to use on as needed basis.  Active tobacco use, patient educated on importance of complete cessation of nicotine use.  She is down to 0.25 packs/day with intentions of stopping prior to the next office visit.  Continue Imdur 30 mg p.o. daily.  Nuclear stress test results reviewed with her in great detail.  Patient has presented to the office multiple times complaining of chest tightness which has both typical and atypical features.  She had a nuclear stress test which noted normal perfusion and her TID ratio was 1.3.  Given the fact the patient has  continued symptoms despite increasing medical therapy I cannot exclude balanced ischemia and therefore we discussed undergoing left heart catheterization with possible intervention and right heart catheterization to evaluate for pulmonary hypertension.    The left and right heart catheterization procedure was explained to the patient in detail. The indication, alternatives, risks and benefits were reviewed. Complications including but not limited to bleeding, infection, acute kidney injury, blood transfusion, heart rhythm disturbances, contrast (dye) reaction, damage to the arteries or nerves in the legs or hands, cerebrovascular accident, myocardial infarction, need for emergent bypass surgery, blood clots in the legs, possible need for emergent blood transfusion, and rarely death were reviewed and discussed with the patient. The patient voices understanding and wishes to proceed.   In the interim if her symptoms increase in intensity, frequency, and/or duration patient is instructed to seek medical attention at the closest ER via EMS.  Patient verbalized understanding and provides verbal feedback.   Patient will need a Covid screen prior to the upcoming catheterization as she is not vaccinated.  Recent blood work reviewed.  EKG performed.  Check CBC prior to heart catheterization.  Patient had couple episode of bright red blood per rectum appears to be hemorrhoidal based on how she describes it.  She is asked to discuss this further with her primary care provider.    FINAL MEDICATION LIST END OF ENCOUNTER: No orders of the defined types were placed in this encounter.    Current Outpatient Medications:    aspirin EC 81 MG tablet, Take 1 tablet (81 mg total) by mouth daily. (Patient taking differently: Take 81 mg by mouth 3 (three) times a week. ), Disp: 150 tablet, Rfl: 2   cetirizine (ZYRTEC) 10 MG tablet, Take 10 mg by mouth daily as needed for allergies. , Disp: , Rfl:    furosemide  (LASIX) 20 MG tablet, TAKE 1 TABLET BY MOUTH TWICE A DAY (Patient taking differently: Take 20 mg by mouth 2 (two) times daily. ), Disp: 180 tablet, Rfl: 0   isosorbide mononitrate (IMDUR) 30 MG 24 hr tablet, Take 1 tablet (30 mg total) by mouth every evening., Disp: 90 tablet, Rfl: 0   metoprolol tartrate (LOPRESSOR) 25 MG tablet, TAKE 1 TABLET BY MOUTH TWICE A DAY (Patient taking differently: Take 25 mg by mouth 2 (two) times daily. ), Disp: 180 tablet, Rfl: 0   pantoprazole (PROTONIX) 40 MG tablet, TAKE 1 TABLET BY MOUTH EVERY DAY (Patient taking differently: Take  40 mg by mouth daily. ), Disp: 90 tablet, Rfl: 0   rosuvastatin (CRESTOR) 10 MG tablet, Take 10 mg by mouth daily. , Disp: , Rfl:    sertraline (ZOLOFT) 25 MG tablet, Take 25 mg by mouth 2 (two) times daily as needed (anxiety). , Disp: , Rfl:    spironolactone (ALDACTONE) 25 MG tablet, Take 25 mg by mouth daily., Disp: , Rfl:    topiramate (TOPAMAX) 25 MG tablet, Take 25 mg by mouth 2 (two) times daily as needed (headaches). , Disp: , Rfl:   Orders Placed This Encounter  Procedures   SARS-COV-2 RNA,(COVID-19) QUAL NAAT   CBC   EKG 12-Lead   --Continue cardiac medications as reconciled in final medication list. --Return in about 4 weeks (around 04/06/2020) for post heart catheterization  follow up.. Or sooner if needed. --Continue follow-up with your primary care physician regarding the management of your other chronic comorbid conditions.  Patient's questions and concerns were addressed to her satisfaction. She voices understanding of the instructions provided during this encounter.   Total encounter time 45 minutes. *Total Encounter Time as defined by the Centers for Medicare and Medicaid Services includes, in addition to the face-to-face time of a patient visit (documented in the note above) non-face-to-face time: obtaining and reviewing outside history, ordering and reviewing medications, tests or procedures, care  coordination (communications with other health care professionals or caregivers) and documentation in the medical record.  This note was created using a voice recognition software as a result there may be grammatical errors inadvertently enclosed that do not reflect the nature of this encounter. Every attempt is made to correct such errors.  Rex Kras, [place], [facility]  Pager: [phone] Office: [phone]

## 2020-03-09 NOTE — H&P (View-Only) (Signed)
Mary Benson Date of Birth: 09/30/1971 MRN: [id number] Primary Care Provider:Spencer, Domingo Pulse, PA-C Former Cardiology Providers: Jeri Lager, APRN, FNP-C, Dr. Adrian Prows Primary Cardiologist: Rex Kras, DO, [facility] (established care 12/06/2019)  Date: 03/09/20 Last Visit: 01/04/2020  Chief Complaint  Patient presents with  . Follow-up    2 month    HPI  Mary Benson is a 49 y.o.  female who presents to the office with a chief complaint of " reevaluation of chest pain." Patient's past medical history and cardiovascular risk factors include: migraine with aura, hypercholesterolemia, obesity, mild obstructive sleep apnea, prior history of stroke in 2018, active tobacco use, and nonobstructive coronary artery disease per coronary CTA.   Patient has history of nonobstructive coronary artery disease seen on coronary CTA back in June 2020.  Patient medical therapy includes beta-blocker, Imdur, and sublingual nitroglycerin tablets as needed.  Since last office visit patient has not required sublingual nitroglycerin tablets.  But she has presented to the office several times in the past with complains of chest discomfort.  At last office visit she was recommended she undergo nuclear stress test to evaluate for reversible ischemia.  Stress test that was performed in April 2021 showed normal myocardial perfusion and no evidence of regional wall motion abnormalities on gated SPECT.  Her TID ratio was noted to be 1.3.   Since last office visit patient states that she continues to feel " chest tightness as if air is being cut off."  The pain is located substernally and also around the throat.  She has episodes of chest discomfort at least 4 times a day and does not know exactly if its gas pain are cardiac related.  The pain is also brought on by effort related activities and at times improves with rest.  She was responded well to Imdur that was started at the last office visit.  Patient did not  take sublingual nitroglycerin tablets as it causes a headache.   Clinically patient is not in congestive heart failure.  Laboratory values reviewed.  She continues to smoke around quarter of a pack per day despite being educated multiple times in regards to the importance of smoking cessation.  ALLERGIES: Allergies  Allergen Reactions  . Hydrocodone Other (See Comments)    ABD. PAIN  . Penicillins Hives     MEDICATION LIST PRIOR TO VISIT: Current Outpatient Medications on File Prior to Visit  Medication Sig Dispense Refill  . aspirin EC 81 MG tablet Take 1 tablet (81 mg total) by mouth daily. (Patient taking differently: Take 81 mg by mouth 3 (three) times a week. ) 150 tablet 2  . cetirizine (ZYRTEC) 10 MG tablet Take 10 mg by mouth daily as needed for allergies.     . furosemide (LASIX) 20 MG tablet TAKE 1 TABLET BY MOUTH TWICE A DAY (Patient taking differently: Take 20 mg by mouth 2 (two) times daily. ) 180 tablet 0  . isosorbide mononitrate (IMDUR) 30 MG 24 hr tablet Take 1 tablet (30 mg total) by mouth every evening. 90 tablet 0  . metoprolol tartrate (LOPRESSOR) 25 MG tablet TAKE 1 TABLET BY MOUTH TWICE A DAY (Patient taking differently: Take 25 mg by mouth 2 (two) times daily. ) 180 tablet 0  . pantoprazole (PROTONIX) 40 MG tablet TAKE 1 TABLET BY MOUTH EVERY DAY (Patient taking differently: Take 40 mg by mouth daily. ) 90 tablet 0  . rosuvastatin (CRESTOR) 10 MG tablet Take 10 mg by mouth daily.     Marland Kitchen  sertraline (ZOLOFT) 25 MG tablet Take 25 mg by mouth 2 (two) times daily as needed (anxiety).     Marland Kitchen spironolactone (ALDACTONE) 25 MG tablet Take 25 mg by mouth daily.    Marland Kitchen topiramate (TOPAMAX) 25 MG tablet Take 25 mg by mouth 2 (two) times daily as needed (headaches).      No current facility-administered medications on file prior to visit.    PAST MEDICAL HISTORY: Past Medical History:  Diagnosis Date  . Anxiety   . Cancer (HCC)    cervical, uterine,   . Distal radius  fracture, right 02/22/2013  . History of cervical cancer   . Laceration of forehead 02/22/2013   sutures in place  . Seasonal allergies   . 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 ABDOMINAL HYSTERECTOMY     partial  . CESAREAN SECTION  1997  . CHOLECYSTECTOMY    . KNEE ARTHROSCOPY Right 09/2014  . LAPAROSCOPIC LYSIS OF ADHESIONS  01/31/2006  . OPEN REDUCTION INTERNAL FIXATION (ORIF) DISTAL RADIAL FRACTURE Right 02/24/2013   Procedure: OPEN REDUCTION INTERNAL FIXATION (ORIF) RIGHT DISTAL RADIUS FRACTURE;  Surgeon: Jolyn Nap, MD;  Location: [facility];  Service: Orthopedics;  Laterality: Right;    FAMILY HISTORY: The patient's family history includes Cervical cancer in her mother; Diabetes in her brother, mother, and sister; High blood pressure in her brother, mother, and sister.   SOCIAL HISTORY:  The patient  reports that she has been smoking cigarettes. She has a 24.00 pack-year smoking history. She has never used smokeless tobacco. She reports current alcohol use. She reports that she does not use drugs.  Review of Systems  Constitutional: Positive for malaise/fatigue. Negative for chills and fever.  HENT: Negative for hoarse voice and nosebleeds.   Eyes: Negative for discharge, double vision and pain.  Cardiovascular: Positive for chest pain (chest tightness). Negative for claudication, dyspnea on exertion, leg swelling, near-syncope, orthopnea, palpitations, paroxysmal nocturnal dyspnea and syncope.  Respiratory: Positive for snoring. Negative for hemoptysis and shortness of breath.   Musculoskeletal: Negative for muscle cramps and myalgias.  Gastrointestinal: Positive for hematochezia (two episode of BRBPR with wiping and in comode). Negative for abdominal pain, constipation, diarrhea, hematemesis, melena, nausea and vomiting.  Neurological: Negative  for dizziness and light-headedness.    PHYSICAL EXAM: Vitals with BMI 03/09/2020 01/04/2020 12/06/2019  Height 5\' 1"  5\' 1"  5\' 1"   Weight 197 lbs 194 lbs 194 lbs  BMI 37.24 67.59 16.38  Systolic 466 599 357  Diastolic 96 81 82  Pulse 93 88 77  Some encounter information is confidential and restricted. Go to Review Flowsheets activity to see all data.    Constitutional: She isoriented to person, place, and time.Vital signs are normal. She appearswell-developedand well-nourished.  HENT:  Head:Normocephalicand atraumatic.  Eyes:Conjunctivaeare normal.  Cardiovascular:Normal rate,regular rhythm,normal heart soundsand intact distal pulses.  Pulmonary/Chest:Effort normaland breath sounds normal. Noaccessory muscle usage. Norespiratory distress.  Abdominal:Soft.Bowel sounds are normal.  Neurological: She isalertand oriented to person, place, and time.  Skin: Skin iswarmand dry.  Psychiatric: She has anormal mood and affect.  RADIOLOGY: Coronary CTA 03/02/2019: Non-cardiac:  Normal. Pulmonary veins drain normally to the left atrium. Calcium Score: 35 Agatston units. Coronary Arteries: Right dominant with no anomalies  LM: No plaque or stenosis. LAD system: Mixed plaque proximal LAD, mild (<50%) stenosis. Circumflex system: Calcified plaque proximal LCx, no  significant stenosis. RCA system: No plaque or stenosis.  IMPRESSION: 1. Coronary artery calcium score 35 Agatston units. This places the patient in the 98th percentile for age and gender, suggesting high risk for future cardiac events. 2. Mild stenosis in the proximal LAD.  CARDIAC DATABASE: EKG: 03/09/2020: Normal sinus rhythm, normal axis, without underlying ischemia or injury pattern.  Echocardiogram: 02/12/2019: LVEF 55%. Left ventricle cavity is normal in size. Mild concentric hypertrophy of the left ventricle. Grade II diastolic dysfunction, elevated LAP. Calculated EF 55%. Trace AR. Mild TR, RVSP  61mmHG,   Stress Testing:  Lexiscan/Modified BruceSestamibi stress test 01/10/2020: Stress LVEF 62%. Normal myocardial perfusion. TID ratio 1.31. In absence of regional myocardial perfusion defects, this is a nonspecific finding. Clinical correaltion. Low risk study.   Heart Catheterization: None  Carotid duplex: None  Vascular imaging: Lower Extremity Arterial Duplex 01/12/2020: No hemodynamically significant stenoses are identified in the lower extremity arterial system.  There is mild diffuse disease noted in the below knee vessels with monophasic waveform at the left ankle in AT vessel.  Non compressible vessels due to medial sclerosis.  Consider evaluation for pseudoclaudication.  Holter Monitor for 48 hours  02/01/2019: No symptoms reported.  There are occasional PACs.  Uneventful Holter monitor.  LABORATORY DATA: CBC Latest Ref Rng & Units 06/08/2017 02/21/2017 02/21/2017  WBC 4.0 - 10.5 K/uL 16.2(H) - 12.6(H)  Hemoglobin 12.0 - 15.0 g/dL 14.7 15.0 15.3(H)  Hematocrit 36 - 46 % 41.3 44.0 42.9  Platelets 150 - 400 K/uL 346 - 343    CMP Latest Ref Rng & Units 03/06/2020 06/17/2019 06/01/2019  Glucose 65 - 99 mg/dL 135(H) 111(H) 93  BUN 6 - 24 mg/dL 18 17 18   Creatinine 0.57 - 1.00 mg/dL 0.87 0.91 0.93  Sodium 134 - 144 mmol/L 144 139 140  Potassium 3.5 - 5.2 mmol/L 3.8 3.9 4.4  Chloride 96 - 106 mmol/L 106 101 108  CO2 20 - 29 mmol/L 22 22 16(L)  Calcium 8.7 - 10.2 mg/dL 9.4 9.4 8.9  Total Protein 6.5 - 8.1 g/dL - - -  Total Bilirubin 0.3 - 1.2 mg/dL - - -  Alkaline Phos 38 - 126 U/L - - -  AST 15 - 41 U/L - - -  ALT 14 - 54 U/L - - -    Lipid Panel  No results found for: CHOL, TRIG, HDL, CHOLHDL, VLDL, LDLCALC, LDLDIRECT, LABVLDL  No results found for: HGBA1C No components found for: NTPROBNP No results found for: TSH  Cardiac Panel (last 3 results) No results for input(s): CKTOTAL, CKMB, TROPONINIHS, RELINDX in the last 72 hours.  IMPRESSION:    ICD-10-CM   1.  Angina pectoris (HCC)  I20.9 CBC    EKG 12-Lead    SARS-COV-2 RNA,(COVID-19) QUAL NAAT  2. Coronary artery calcification seen on computed tomography  I25.10 SARS-COV-2 RNA,(COVID-19) QUAL NAAT  3. Nonobstructive atherosclerosis of coronary artery  I25.10   4. Mixed hyperlipidemia  E78.2   5. Tobacco abuse  Z72.0   6. OSA (obstructive sleep apnea)  G47.33   7. Class 2 severe obesity due to excess calories with serious comorbidity and body mass index (BMI) of 36.0 to 36.9 in adult (HCC)  E66.01    Z68.36   8. H/O: CVA (cerebrovascular accident) no residual deficit 2018  Z86.73      RECOMMENDATIONS: Mary Benson is a 49 y.o. female whose past medical history and cardiovascular risk factors include: migraine with aura, hypercholesterolemia, obesity, mild obstructive sleep apnea,  prior history of stroke in 2018, active tobacco use, and nonobstructive coronary artery disease per coronary CTA.   Angina pectoris:  She has had a thorough cardiac work-up including an echocardiogram, coronary artery calcification score, coronary CTA.  Given the patient's coronary calcification score and nonobstructive coronary artery disease educated on importance of aggressively improving her modifiable cardiovascular risk factors.  Continue aspirin.  Continue statin therapy.  Continue beta-blocker therapy.  Patient does have sublingual nitroglycerin tablets to use on as needed basis.  Active tobacco use, patient educated on importance of complete cessation of nicotine use.  She is down to 0.25 packs/day with intentions of stopping prior to the next office visit.  Continue Imdur 30 mg p.o. daily.  Nuclear stress test results reviewed with her in great detail.  Patient has presented to the office multiple times complaining of chest tightness which has both typical and atypical features.  She had a nuclear stress test which noted normal perfusion and her TID ratio was 1.3.  Given the fact the patient has  continued symptoms despite increasing medical therapy I cannot exclude balanced ischemia and therefore we discussed undergoing left heart catheterization with possible intervention and right heart catheterization to evaluate for pulmonary hypertension.    The left and right heart catheterization procedure was explained to the patient in detail. The indication, alternatives, risks and benefits were reviewed. Complications including but not limited to bleeding, infection, acute kidney injury, blood transfusion, heart rhythm disturbances, contrast (dye) reaction, damage to the arteries or nerves in the legs or hands, cerebrovascular accident, myocardial infarction, need for emergent bypass surgery, blood clots in the legs, possible need for emergent blood transfusion, and rarely death were reviewed and discussed with the patient. The patient voices understanding and wishes to proceed.   In the interim if her symptoms increase in intensity, frequency, and/or duration patient is instructed to seek medical attention at the closest ER via EMS.  Patient verbalized understanding and provides verbal feedback.   Patient will need a Covid screen prior to the upcoming catheterization as she is not vaccinated.  Recent blood work reviewed.  EKG performed.  Check CBC prior to heart catheterization.  Patient had couple episode of bright red blood per rectum appears to be hemorrhoidal based on how she describes it.  She is asked to discuss this further with her primary care provider.    FINAL MEDICATION LIST END OF ENCOUNTER: No orders of the defined types were placed in this encounter.    Current Outpatient Medications:  .  aspirin EC 81 MG tablet, Take 1 tablet (81 mg total) by mouth daily. (Patient taking differently: Take 81 mg by mouth 3 (three) times a week. ), Disp: 150 tablet, Rfl: 2 .  cetirizine (ZYRTEC) 10 MG tablet, Take 10 mg by mouth daily as needed for allergies. , Disp: , Rfl:  .  furosemide  (LASIX) 20 MG tablet, TAKE 1 TABLET BY MOUTH TWICE A DAY (Patient taking differently: Take 20 mg by mouth 2 (two) times daily. ), Disp: 180 tablet, Rfl: 0 .  isosorbide mononitrate (IMDUR) 30 MG 24 hr tablet, Take 1 tablet (30 mg total) by mouth every evening., Disp: 90 tablet, Rfl: 0 .  metoprolol tartrate (LOPRESSOR) 25 MG tablet, TAKE 1 TABLET BY MOUTH TWICE A DAY (Patient taking differently: Take 25 mg by mouth 2 (two) times daily. ), Disp: 180 tablet, Rfl: 0 .  pantoprazole (PROTONIX) 40 MG tablet, TAKE 1 TABLET BY MOUTH EVERY DAY (Patient taking differently: Take  40 mg by mouth daily. ), Disp: 90 tablet, Rfl: 0 .  rosuvastatin (CRESTOR) 10 MG tablet, Take 10 mg by mouth daily. , Disp: , Rfl:  .  sertraline (ZOLOFT) 25 MG tablet, Take 25 mg by mouth 2 (two) times daily as needed (anxiety). , Disp: , Rfl:  .  spironolactone (ALDACTONE) 25 MG tablet, Take 25 mg by mouth daily., Disp: , Rfl:  .  topiramate (TOPAMAX) 25 MG tablet, Take 25 mg by mouth 2 (two) times daily as needed (headaches). , Disp: , Rfl:   Orders Placed This Encounter  Procedures  . SARS-COV-2 RNA,(COVID-19) QUAL NAAT  . CBC  . EKG 12-Lead   --Continue cardiac medications as reconciled in final medication list. --Return in about 4 weeks (around 04/06/2020) for post heart catheterization  follow up.. Or sooner if needed. --Continue follow-up with your primary care physician regarding the management of your other chronic comorbid conditions.  Patient's questions and concerns were addressed to her satisfaction. She voices understanding of the instructions provided during this encounter.   Total encounter time 45 minutes. *Total Encounter Time as defined by the Centers for Medicare and Medicaid Services includes, in addition to the face-to-face time of a patient visit (documented in the note above) non-face-to-face time: obtaining and reviewing outside history, ordering and reviewing medications, tests or procedures, care  coordination (communications with other health care professionals or caregivers) and documentation in the medical record.  This note was created using a voice recognition software as a result there may be grammatical errors inadvertently enclosed that do not reflect the nature of this encounter. Every attempt is made to correct such errors.  Rex Kras, [place], [facility]  Pager: [phone] Office: [phone]

## 2020-03-10 ENCOUNTER — Other Ambulatory Visit (HOSPITAL_COMMUNITY)
Admission: RE | Admit: 2020-03-10 | Discharge: 2020-03-10 | Disposition: A | Discharge status: Home / Self Care | Payer: No Typology Code available for payment source | Source: Ambulatory Visit | Attending: Cardiology | Admitting: Cardiology

## 2020-03-10 DIAGNOSIS — Z01812 Encounter for preprocedural laboratory examination: Secondary | ICD-10-CM | POA: Diagnosis not present

## 2020-03-10 DIAGNOSIS — Z20822 Contact with and (suspected) exposure to covid-19: Secondary | ICD-10-CM | POA: Insufficient documentation

## 2020-03-10 LAB — SARS CORONAVIRUS 2 (TAT 6-24 HRS): SARS Coronavirus 2: NEGATIVE

## 2020-03-11 LAB — CBC
Hematocrit: 46.8 % — ABNORMAL HIGH (ref 34.0–46.6)
Hemoglobin: 15.3 g/dL (ref 11.1–15.9)
MCH: 28.8 pg (ref 26.6–33.0)
MCHC: 32.7 g/dL (ref 31.5–35.7)
MCV: 88 fL (ref 79–97)
Platelets: 395 10*3/uL (ref 150–450)
RBC: 5.31 x10E6/uL — ABNORMAL HIGH (ref 3.77–5.28)
RDW: 13.4 % (ref 11.7–15.4)
WBC: 11.4 10*3/uL — ABNORMAL HIGH (ref 3.4–10.8)

## 2020-03-13 ENCOUNTER — Telehealth: Payer: Self-pay

## 2020-03-14 ENCOUNTER — Other Ambulatory Visit: Payer: Self-pay

## 2020-03-14 ENCOUNTER — Encounter (HOSPITAL_COMMUNITY)
Admission: RE | Disposition: A | Discharge status: Home / Self Care | Payer: Self-pay | Source: Home / Self Care | Attending: Cardiology

## 2020-03-14 ENCOUNTER — Ambulatory Visit (HOSPITAL_COMMUNITY)
Admission: RE | Admit: 2020-03-14 | Discharge: 2020-03-14 | Disposition: A | Discharge status: Home / Self Care | Payer: No Typology Code available for payment source | Attending: Cardiology | Admitting: Cardiology

## 2020-03-14 ENCOUNTER — Telehealth: Payer: Self-pay

## 2020-03-14 DIAGNOSIS — Z880 Allergy status to penicillin: Secondary | ICD-10-CM | POA: Insufficient documentation

## 2020-03-14 DIAGNOSIS — I209 Angina pectoris, unspecified: Secondary | ICD-10-CM | POA: Diagnosis present

## 2020-03-14 DIAGNOSIS — F17200 Nicotine dependence, unspecified, uncomplicated: Secondary | ICD-10-CM | POA: Diagnosis not present

## 2020-03-14 DIAGNOSIS — Z8669 Personal history of other diseases of the nervous system and sense organs: Secondary | ICD-10-CM | POA: Insufficient documentation

## 2020-03-14 DIAGNOSIS — Z955 Presence of coronary angioplasty implant and graft: Secondary | ICD-10-CM

## 2020-03-14 DIAGNOSIS — Z79899 Other long term (current) drug therapy: Secondary | ICD-10-CM | POA: Insufficient documentation

## 2020-03-14 DIAGNOSIS — G4733 Obstructive sleep apnea (adult) (pediatric): Secondary | ICD-10-CM | POA: Diagnosis not present

## 2020-03-14 DIAGNOSIS — Z8249 Family history of ischemic heart disease and other diseases of the circulatory system: Secondary | ICD-10-CM | POA: Insufficient documentation

## 2020-03-14 DIAGNOSIS — Z7982 Long term (current) use of aspirin: Secondary | ICD-10-CM | POA: Diagnosis not present

## 2020-03-14 DIAGNOSIS — R072 Precordial pain: Secondary | ICD-10-CM | POA: Diagnosis present

## 2020-03-14 DIAGNOSIS — E782 Mixed hyperlipidemia: Secondary | ICD-10-CM | POA: Diagnosis not present

## 2020-03-14 DIAGNOSIS — Z885 Allergy status to narcotic agent status: Secondary | ICD-10-CM | POA: Insufficient documentation

## 2020-03-14 DIAGNOSIS — Z8673 Personal history of transient ischemic attack (TIA), and cerebral infarction without residual deficits: Secondary | ICD-10-CM | POA: Insufficient documentation

## 2020-03-14 DIAGNOSIS — Z6837 Body mass index (BMI) 37.0-37.9, adult: Secondary | ICD-10-CM | POA: Diagnosis not present

## 2020-03-14 DIAGNOSIS — I25119 Atherosclerotic heart disease of native coronary artery with unspecified angina pectoris: Secondary | ICD-10-CM | POA: Diagnosis not present

## 2020-03-14 HISTORY — PX: CORONARY STENT INTERVENTION: CATH118234

## 2020-03-14 HISTORY — PX: LEFT HEART CATH AND CORONARY ANGIOGRAPHY: CATH118249

## 2020-03-14 LAB — POCT ACTIVATED CLOTTING TIME
Activated Clotting Time: 274 seconds
Activated Clotting Time: 285 seconds

## 2020-03-14 SURGERY — LEFT HEART CATH AND CORONARY ANGIOGRAPHY
Anesthesia: LOCAL

## 2020-03-14 MED ORDER — HEPARIN SODIUM (PORCINE) 1000 UNIT/ML IJ SOLN
INTRAMUSCULAR | Status: AC
  Filled 2020-03-14: qty 1

## 2020-03-14 MED ORDER — CLOPIDOGREL BISULFATE 300 MG PO TABS
ORAL_TABLET | ORAL | Status: AC
  Filled 2020-03-14: qty 2

## 2020-03-14 MED ORDER — SODIUM CHLORIDE 0.9 % WEIGHT BASED INFUSION: 1.5000 mL/kg/h | INTRAVENOUS | Status: DC

## 2020-03-14 MED ORDER — FAMOTIDINE IN NACL 20-0.9 MG/50ML-% IV SOLN
INTRAVENOUS | Status: AC
  Filled 2020-03-14: qty 50

## 2020-03-14 MED ORDER — FAMOTIDINE IN NACL 20-0.9 MG/50ML-% IV SOLN
INTRAVENOUS | Status: AC | PRN
  Administered 2020-03-14: 20 mg via INTRAVENOUS

## 2020-03-14 MED ORDER — SODIUM CHLORIDE 0.9 % IV SOLN: 250.0000 mL | INTRAVENOUS | Status: DC | PRN

## 2020-03-14 MED ORDER — SODIUM CHLORIDE 0.9% FLUSH: 3.0000 mL | Freq: Two times a day (BID) | INTRAVENOUS | Status: DC

## 2020-03-14 MED ORDER — NITROGLYCERIN 1 MG/10 ML FOR IR/CATH LAB
INTRA_ARTERIAL | Status: DC | PRN
  Administered 2020-03-14 (×4): 200 ug via INTRACORONARY

## 2020-03-14 MED ORDER — LIDOCAINE HCL (PF) 1 % IJ SOLN
INTRAMUSCULAR | Status: DC | PRN
  Administered 2020-03-14: 2 mL via INTRADERMAL

## 2020-03-14 MED ORDER — ONDANSETRON HCL 4 MG/2ML IJ SOLN: 4.0000 mg | Freq: Four times a day (QID) | INTRAMUSCULAR | Status: DC | PRN

## 2020-03-14 MED ORDER — MIDAZOLAM HCL 2 MG/2ML IJ SOLN
INTRAMUSCULAR | Status: AC
  Filled 2020-03-14: qty 2

## 2020-03-14 MED ORDER — NITROGLYCERIN 0.4 MG SL SUBL: 0.4000 mg | SUBLINGUAL_TABLET | SUBLINGUAL | Status: DC | PRN

## 2020-03-14 MED ORDER — VERAPAMIL HCL 2.5 MG/ML IV SOLN
INTRAVENOUS | Status: AC
  Filled 2020-03-14: qty 2

## 2020-03-14 MED ORDER — HEPARIN (PORCINE) IN NACL 1000-0.9 UT/500ML-% IV SOLN
INTRAVENOUS | Status: AC
  Filled 2020-03-14: qty 500

## 2020-03-14 MED ORDER — NITROGLYCERIN 1 MG/10 ML FOR IR/CATH LAB
INTRA_ARTERIAL | Status: AC
  Filled 2020-03-14: qty 10

## 2020-03-14 MED ORDER — METOPROLOL TARTRATE 5 MG/5ML IV SOLN
INTRAVENOUS | Status: DC | PRN
  Administered 2020-03-14: 2.5 mg via INTRAVENOUS

## 2020-03-14 MED ORDER — IOHEXOL 350 MG/ML SOLN
INTRAVENOUS | Status: DC | PRN
  Administered 2020-03-14: 190 mL

## 2020-03-14 MED ORDER — FENTANYL CITRATE (PF) 100 MCG/2ML IJ SOLN
INTRAMUSCULAR | Status: DC | PRN
  Administered 2020-03-14 (×2): 50 ug via INTRAVENOUS

## 2020-03-14 MED ORDER — HEPARIN (PORCINE) IN NACL 1000-0.9 UT/500ML-% IV SOLN
INTRAVENOUS | Status: DC | PRN
  Administered 2020-03-14 (×2): 500 mL

## 2020-03-14 MED ORDER — NITROGLYCERIN 0.4 MG SL SUBL
SUBLINGUAL_TABLET | SUBLINGUAL | Status: AC
  Filled 2020-03-14: qty 1

## 2020-03-14 MED ORDER — ACETAMINOPHEN 325 MG PO TABS
650.0000 mg | ORAL_TABLET | ORAL | Status: DC | PRN
  Administered 2020-03-14: 650 mg via ORAL
  Filled 2020-03-14: qty 2

## 2020-03-14 MED ORDER — MIDAZOLAM HCL 2 MG/2ML IJ SOLN
INTRAMUSCULAR | Status: DC | PRN
  Administered 2020-03-14: 2 mg via INTRAVENOUS

## 2020-03-14 MED ORDER — FENTANYL CITRATE (PF) 100 MCG/2ML IJ SOLN
INTRAMUSCULAR | Status: AC
  Filled 2020-03-14: qty 2

## 2020-03-14 MED ORDER — NITROGLYCERIN 0.4 MG SL SUBL
0.4000 mg | SUBLINGUAL_TABLET | SUBLINGUAL | 3 refills | Status: DC | PRN
Start: 2020-03-14 — End: 2020-06-07

## 2020-03-14 MED ORDER — CLOPIDOGREL BISULFATE 75 MG PO TABS
75.0000 mg | ORAL_TABLET | Freq: Every day | ORAL | 6 refills | Status: DC
Start: 2020-03-14 — End: 2020-05-11

## 2020-03-14 MED ORDER — CLOPIDOGREL BISULFATE 300 MG PO TABS
ORAL_TABLET | ORAL | Status: DC | PRN
  Administered 2020-03-14: 600 mg via ORAL

## 2020-03-14 MED ORDER — ASPIRIN EC 81 MG PO TBEC
81.0000 mg | DELAYED_RELEASE_TABLET | Freq: Every day | ORAL | 2 refills | Status: AC
Start: 2020-03-14 — End: ?

## 2020-03-14 MED ORDER — LIDOCAINE HCL (PF) 1 % IJ SOLN
INTRAMUSCULAR | Status: AC
  Filled 2020-03-14: qty 30

## 2020-03-14 MED ORDER — METOPROLOL TARTRATE 5 MG/5ML IV SOLN
INTRAVENOUS | Status: AC
  Filled 2020-03-14: qty 5

## 2020-03-14 MED ORDER — ASPIRIN 81 MG PO CHEW
81.0000 mg | CHEWABLE_TABLET | ORAL | Status: AC
  Administered 2020-03-14: 81 mg via ORAL
  Filled 2020-03-14: qty 1

## 2020-03-14 MED ORDER — HEPARIN SODIUM (PORCINE) 1000 UNIT/ML IJ SOLN
INTRAMUSCULAR | Status: DC | PRN
  Administered 2020-03-14 (×2): 4000 [IU] via INTRAVENOUS

## 2020-03-14 MED ORDER — SODIUM CHLORIDE 0.9% FLUSH: 3.0000 mL | INTRAVENOUS | Status: DC | PRN

## 2020-03-14 MED ORDER — SODIUM CHLORIDE 0.9 % WEIGHT BASED INFUSION
3.0000 mL/kg/h | INTRAVENOUS | Status: AC
  Administered 2020-03-14: 3 mL/kg/h via INTRAVENOUS

## 2020-03-14 MED ORDER — HYDRALAZINE HCL 20 MG/ML IJ SOLN: 5.0000 mg | INTRAMUSCULAR | Status: DC | PRN

## 2020-03-14 MED ORDER — SODIUM CHLORIDE 0.9 % WEIGHT BASED INFUSION: 1.0000 mL/kg/h | INTRAVENOUS | Status: DC

## 2020-03-14 MED ORDER — VERAPAMIL HCL 2.5 MG/ML IV SOLN
INTRAVENOUS | Status: DC | PRN
  Administered 2020-03-14: 10 mL via INTRA_ARTERIAL

## 2020-03-14 MED FILL — CLOPIDOGREL 75 MG TABLET: 75 | 30 days supply | Qty: 30 | Fill #0

## 2020-03-14 SURGICAL SUPPLY — 19 items
BALLN SAPPHIRE ~~LOC~~ 3.5X12 (BALLOONS) ×2 IMPLANT
BALLN WOLVERINE 3.00X15 (BALLOONS) ×2
BALLOON WOLVERINE 3.00X15 (BALLOONS) ×1 IMPLANT
CATH OPTICROSS HD (CATHETERS) ×2 IMPLANT
CATH OPTITORQUE TIG 4.0 5F (CATHETERS) ×2 IMPLANT
CATH VISTA GUIDE 6FR XBLAD3.5 (CATHETERS) ×2 IMPLANT
DEVICE RAD COMP TR BAND LRG (VASCULAR PRODUCTS) ×2 IMPLANT
GLIDESHEATH SLEND A-KIT 6F 22G (SHEATH) ×2 IMPLANT
GUIDEWIRE INQWIRE 1.5J.035X260 (WIRE) ×1 IMPLANT
INQWIRE 1.5J .035X260CM (WIRE) ×2
KIT ENCORE 26 ADVANTAGE (KITS) ×2 IMPLANT
KIT HEART LEFT (KITS) ×2 IMPLANT
PACK CARDIAC CATHETERIZATION (CUSTOM PROCEDURE TRAY) ×2 IMPLANT
SLED PULL BACK IVUS (MISCELLANEOUS) ×2 IMPLANT
STENT RESOLUTE ONYX 2.5X18 (Permanent Stent) ×2 IMPLANT
STENT RESOLUTE ONYX 3.0X26 (Permanent Stent) ×2 IMPLANT
TRANSDUCER W/STOPCOCK (MISCELLANEOUS) ×2 IMPLANT
TUBING CIL FLEX 10 FLL-RA (TUBING) ×2 IMPLANT
WIRE COUGAR XT STRL 190CM (WIRE) ×2 IMPLANT

## 2020-03-14 NOTE — Progress Notes (Signed)
Dr Einar Gip wanted pt to have SL nitro for pt to go home with since its so late for pharm pick up// pt states she already has some// not expired nitro at home. Pt understands how to use.

## 2020-03-14 NOTE — Discharge Instructions (Signed)
Radial Site Care  This sheet gives you information about how to care for yourself after your procedure. Your health care provider may also give you more specific instructions. If you have problems or questions, contact your health care provider. What can I expect after the procedure? After the procedure, it is common to have:  Bruising and tenderness at the catheter insertion area. Follow these instructions at home: Medicines  Take over-the-counter and prescription medicines only as told by your health care provider. Insertion site care  Follow instructions from your health care provider about how to take care of your insertion site. Make sure you: ? Wash your hands with soap and water before you change your bandage (dressing). If soap and water are not available, use hand sanitizer. ? Change your dressing as told by your health care provider. ? Leave stitches (sutures), skin glue, or adhesive strips in place. These skin closures may need to stay in place for 2 weeks or longer. If adhesive strip edges start to loosen and curl up, you may trim the loose edges. Do not remove adhesive strips completely unless your health care provider tells you to do that.  Check your insertion site every day for signs of infection. Check for: ? Redness, swelling, or pain. ? Fluid or blood. ? Pus or a bad smell. ? Warmth.  Do not take baths, swim, or use a hot tub until your health care provider approves.  You may shower 24-48 hours after the procedure, or as directed by your health care provider. ? Remove the dressing and gently wash the site with plain soap and water. ? Pat the area dry with a clean towel. ? Do not rub the site. That could cause bleeding.  Do not apply powder or lotion to the site. Activity   For 24 hours after the procedure, or as directed by your health care provider: ? Do not flex or bend the affected arm. ? Do not push or pull heavy objects with the affected arm. ? Do not  drive yourself home from the hospital or clinic. You may drive 24 hours after the procedure unless your health care provider tells you not to. ? Do not operate machinery or power tools.  Do not lift anything that is heavier than 10 lb (4.5 kg), or the limit that you are told, until your health care provider says that it is safe.  Ask your health care provider when it is okay to: ? Return to work or school. ? Resume usual physical activities or sports. ? Resume sexual activity. General instructions  If the catheter site starts to bleed, raise your arm and put firm pressure on the site. If the bleeding does not stop, get help right away. This is a medical emergency.  If you went home on the same day as your procedure, a responsible adult should be with you for the first 24 hours after you arrive home.  Keep all follow-up visits as told by your health care provider. This is important. Contact a health care provider if:  You have a fever.  You have redness, swelling, or yellow drainage around your insertion site. Get help right away if:  You have unusual pain at the radial site.  The catheter insertion area swells very fast.  The insertion area is bleeding, and the bleeding does not stop when you hold steady pressure on the area.  Your arm or hand becomes pale, cool, tingly, or numb. These symptoms may represent a serious problem   that is an emergency. Do not wait to see if the symptoms will go away. Get medical help right away. Call your local emergency services (911 in the [place] Do not drive yourself to the hospital. Summary  After the procedure, it is common to have bruising and tenderness at the site.  Follow instructions from your health care provider about how to take care of your radial site wound. Check the wound every day for signs of infection.  Do not lift anything that is heavier than 10 lb (4.5 kg), or the limit that you are told, until your health care provider says  that it is safe. This information is not intended to replace advice given to you by your health care provider. Make sure you discuss any questions you have with your health care provider. Document Revised: 10/22/2017 Document Reviewed: 10/22/2017 Elsevier Patient Education  2020 [employer]    Coronary Angiogram With Stent, Care After This sheet gives you information about how to care for yourself after your procedure. Your health care provider may also give you more specific instructions. If you have problems or questions, contact your health care provider. What can I expect after the procedure? After the procedure, it is common to have:  Bruising and tenderness at the insertion site. This usually fades within 1-2 weeks.  A collection of blood under the skin (hematoma). This usually decreases within 1-2 weeks. Follow these instructions at home: Medicines  Take over-the-counter and prescription medicines only as told by your health care provider.  If you were prescribed an antibiotic medicine, take it as told by your health care provider. Do not stop using the antibiotic even if you start to feel better.  If you take medicines for diabetes, your health care provider may need to change how much you take. Ask your health care provider for specific directions about taking your diabetes medicines.  If you are taking blood thinners: ? Talk with your health care provider before you take any medicines that contain aspirin or NSAIDs, such as ibuprofen. These medicines increase your risk for dangerous bleeding. ? Take your medicine exactly as told, at the same time every day. ? Avoid activities that could cause injury or bruising, and follow instructions about how to prevent falls. ? Wear a medical alert bracelet or carry a card that lists what medicines you take. Eating and drinking   Follow instructions from your health care provider about eating or drinking restrictions.  Eat a  heart-healthy diet that includes plenty of fresh fruits and vegetables.  Avoid foods that are high in salt, sugar, or saturated fat. Avoid fried foods or canned or highly processed food.  Drink enough fluid to keep your urine pale yellow. Alcohol use  Do not drink alcohol if: ? Your health care provider tells you not to. ? You are pregnant, may be pregnant, or plan to become pregnant.  If you drink alcohol: ? Limit how much you use to:  0-1 drink a day for women.  0-2 drinks a day for men. ? Be aware of how much alcohol is in your drink. In the [place] one drink equals one 12 oz bottle of beer (355 mL), one 5 oz glass of wine (148 mL), or one 1 oz glass of hard liquor (44 mL). Bathing  Do not take baths, swim, or use a hot tub until your health care provider approves. Ask your health care provider if you may take showers. You may only be allowed to take sponge   baths.  Gently wash the insertion site with plain soap and water.  Pat the area dry with a clean towel. Do not rub. This may cause bleeding. Incision care  Follow instructions from your health care provider about how to take care of your insertion area. Make sure you: ? Wash your hands with soap and water before and after you change your bandage (dressing). If soap and water are not available, use hand sanitizer. ? Change your dressing as told by your health care provider. ? Leave stitches (sutures) or adhesive strips in place. These skin closures may need to stay in place for 2 weeks or longer. If adhesive strip edges start to loosen and curl up, you may trim the loose edges. Do not remove adhesive strips completely unless your health care provider tells you to do that.  Do not apply powder or lotion on the insertion area.  Check your insertion area every day for signs of infection. Check for: ? Redness, swelling, or pain. ? Fluid or blood. ? Warmth. ? Pus or a bad smell. Activity  Do not drive for 24 hours if you were  given a sedative during your procedure.  Rest as told by your health care provider. ? Avoid sitting for a long time without moving. Get up to take short walks every 1-2 hours. This is important to improve blood flow and breathing. Ask for help if you feel weak or unsteady.  Do not lift anything that is heavier than 10 lb (4.5 kg), or the limit that you are told, until your health care provider says that it is safe.  Return to your normal activities as told by your health care provider. Ask your health care provider what activities are safe for you. Lifestyle   Do not use any products that contain nicotine or tobacco, such as cigarettes, e-cigarettes, and chewing tobacco. If you need help quitting, ask your health care provider.  If needed, work with your health care provider to treat other problems, such as being overweight, or having high blood pressure or diabetes.  Get regular exercise. Do exercises as told by your health care provider. General instructions  Tell all your health care providers that you have a stent. This is especially important if you are going to get imaging studies, such as MRI.  Wear compression stockings as told by your health care provider. These stockings help to prevent blood clots and reduce swelling in your legs.  Do not strain during a bowel movement if the procedure was done through your leg. Straining may cause bleeding from the insertion site.  Keep all follow-up visits as directed by your health care provider. This is important. Contact a health care provider if you:  Have a fever.  Have chills.  Have redness, swelling, or pain around your insertion area.  Have fluid or blood (other than a little blood on the dressing) coming from your insertion area.  Notice that your insertion area feels warm to the touch.  Have pus or a bad smell coming from your insertion area.  Have more bleeding from the insertion area. Hold pressure on the area. Get  help right away if:  You develop chest pain or shortness of breath.  You feel like fainting or you faint.  Your leg or arm becomes cool, numb, or tingly.  You have unusual pain.  Your insertion area is bleeding, and bleeding continues after 30 minutes of steadily held pressure.  You develop bleeding anywhere else, including from your   rectum. There may be bright red blood in your urine or stool, or you may have black, tarry stool. These symptoms may represent a serious problem that is an emergency. Do not wait to see if the symptoms will go away. Get medical help right away. Call your local emergency services (911 in the [place] Do not drive yourself to the hospital. Summary  After this procedure, it is common to have bruising and tenderness around the catheter insertion site. This will go away in a few weeks.  Follow your health care provider's instructions about caring for your insertion site. Change dressing and clean the area as instructed.  Eat a heart-healthy diet. Limit alcohol use. Do not use tobacco or nicotine.  Contact a health care provider if you have fever or chills, or if you have pus or a bad smell coming from the site.  Get help right away if you develop chest pain, you faint, or have bleeding at the insertion site. This information is not intended to replace advice given to you by your health care provider. Make sure you discuss any questions you have with your health care provider. Document Revised: 04/07/2019 Document Reviewed: 04/07/2019 Elsevier Patient Education  2020 [employer] Inc[employer]  

## 2020-03-14 NOTE — Interval H&P Note (Signed)
History and Physical Interval Note:  03/14/2020 11:03 AM  Mary Benson  has presented today for surgery, with the diagnosis of Angina.  The various methods of treatment have been discussed with the patient and family. After consideration of risks, benefits and other options for treatment, the patient has consented to  Procedure(s): LEFT HEART CATH AND CORONARY ANGIOGRAPHY (N/A) and possible angioplasty as a surgical intervention.  The patient's history has been reviewed, patient examined, no change in status, stable for surgery.  I have reviewed the patient's chart and labs.  Questions were answered to the patient's satisfaction.    Cath Lab Visit (complete for each Cath Lab visit)  Clinical Evaluation Leading to the Procedure:   ACS: No.  Non-ACS:    Anginal Classification: CCS III  Anti-ischemic medical therapy: Maximal Therapy (2 or more classes of medications)  Non-Invasive Test Results: Low-risk stress test findings: cardiac mortality <1%/year  Prior CABG: No previous CABG   Mary Benson

## 2020-03-14 NOTE — Progress Notes (Signed)
Discussed stent, restrictions, Plavix, smoking cessation, diet, exercise, NTG and CRPII. Pt very receptive. She is eager to quit smoking. Long discussion regarding methods and tips. She threw her cigarettes away this morning. Will refer to [facility], ACSM 2:10 PM 03/14/2020

## 2020-03-14 NOTE — Telephone Encounter (Signed)
Peer to Peer needed for what

## 2020-03-15 ENCOUNTER — Telehealth: Payer: Self-pay | Admitting: Cardiology

## 2020-03-15 ENCOUNTER — Encounter (HOSPITAL_COMMUNITY): Payer: Self-pay | Admitting: Cardiology

## 2020-03-15 MED ORDER — FENTANYL CITRATE (PF) 100 MCG/2ML IJ SOLN
INTRAMUSCULAR | Status: DC | PRN
  Administered 2020-03-14: 50 ug via INTRAVENOUS

## 2020-03-15 NOTE — Telephone Encounter (Signed)
It clearly states 2 times a day PRN for dyspnea

## 2020-03-16 NOTE — Telephone Encounter (Signed)
I called patient and gave her the information about medications

## 2020-03-27 ENCOUNTER — Emergency Department (HOSPITAL_COMMUNITY): Payer: No Typology Code available for payment source

## 2020-03-27 ENCOUNTER — Emergency Department (HOSPITAL_COMMUNITY)
Admission: EM | Admit: 2020-03-27 | Discharge: 2020-03-28 | Disposition: A | Discharge status: Home / Self Care | Payer: No Typology Code available for payment source | Attending: Emergency Medicine | Admitting: Emergency Medicine

## 2020-03-27 ENCOUNTER — Encounter (HOSPITAL_COMMUNITY): Payer: Self-pay | Admitting: Emergency Medicine

## 2020-03-27 ENCOUNTER — Other Ambulatory Visit: Payer: Self-pay

## 2020-03-27 DIAGNOSIS — Z5321 Procedure and treatment not carried out due to patient leaving prior to being seen by health care provider: Secondary | ICD-10-CM | POA: Diagnosis not present

## 2020-03-27 DIAGNOSIS — I10 Essential (primary) hypertension: Secondary | ICD-10-CM | POA: Diagnosis present

## 2020-03-27 LAB — PROTIME-INR
INR: 0.9 (ref 0.8–1.2)
Prothrombin Time: 12 seconds (ref 11.4–15.2)

## 2020-03-27 LAB — TROPONIN I (HIGH SENSITIVITY): Troponin I (High Sensitivity): 12 ng/L (ref <18)

## 2020-03-27 LAB — CBC
HCT: 40 % (ref 36.0–46.0)
Hemoglobin: 13.6 g/dL (ref 12.0–15.0)
MCH: 29.4 pg (ref 26.0–34.0)
MCHC: 34 g/dL (ref 30.0–36.0)
MCV: 86.4 fL (ref 80.0–100.0)
Platelets: 408 10*3/uL — ABNORMAL HIGH (ref 150–400)
RBC: 4.63 MIL/uL (ref 3.87–5.11)
RDW: 12.8 % (ref 11.5–15.5)
WBC: 11.2 10*3/uL — ABNORMAL HIGH (ref 4.0–10.5)
nRBC: 0 % (ref 0.0–0.2)

## 2020-03-27 LAB — I-STAT BETA HCG BLOOD, ED (MC, WL, AP ONLY): I-stat hCG, quantitative: 7.4 m[IU]/mL — ABNORMAL HIGH (ref <5)

## 2020-03-27 LAB — BASIC METABOLIC PANEL
Anion gap: 11 (ref 5–15)
BUN: 20 mg/dL (ref 6–20)
CO2: 21 mmol/L — ABNORMAL LOW (ref 22–32)
Calcium: 9.1 mg/dL (ref 8.9–10.3)
Chloride: 106 mmol/L (ref 98–111)
Creatinine, Ser: 1.1 mg/dL — ABNORMAL HIGH (ref 0.44–1.00)
GFR calc Af Amer: >60 mL/min (ref >60)
GFR calc non Af Amer: >60 mL/min (ref >60)
Glucose, Bld: 144 mg/dL — ABNORMAL HIGH (ref 70–99)
Potassium: 3.4 mmol/L — ABNORMAL LOW (ref 3.5–5.1)
Sodium: 138 mmol/L (ref 135–145)

## 2020-03-27 MED ORDER — SODIUM CHLORIDE 0.9% FLUSH: 3.0000 mL | Freq: Once | INTRAVENOUS | Status: DC

## 2020-03-27 NOTE — ED Triage Notes (Signed)
Pt reports she had two stints placed two weeks ago, she was feeling well until about 2pm today she began experiencing left sided chest pressure and hypertension.  Pt reports "sweating that comes and goes w/ the pressure intensity."

## 2020-03-27 NOTE — ED Notes (Signed)
Pt reprots she will go to [facility]'s office in the AM

## 2020-03-28 ENCOUNTER — Telehealth: Payer: Self-pay | Admitting: Cardiology

## 2020-03-28 NOTE — Telephone Encounter (Signed)
Was informed that the patient's blood pressure was running high. Called the patient's cell phone number but she was unable to pick up and her voicemail is full and therefore cannot leave a message. We'll try again.  Rex Kras, [place], [facility]  Pager: [phone] Office: [phone]

## 2020-03-28 NOTE — Telephone Encounter (Signed)
Patient called back:  Elevated BP: Patient states that she recently got a blood pressure cuff and her blood pressures range between 129-130mmHG.   Chest tightness:  Right sided CP, 5/10 (pain before her recent PCI was 8-9/10), ache, lasts for couple minutes, non-effort related, does not resolve with rest, self-limited. No use of sublingual nitroglycerin tablets secondary to headaches.  Plan: Patient is asked to increase her isosorbide mononitrate to 60 mg p.o. daily. If she continues to have atrial fibrillation would like to see her tomorrow at 9:45 AM.  If the chest pain is improving she can keep her next week's appointment as scheduled. Patient is asked to call the office if her systolic blood pressures are greater than 140 mmHg.  Rex Kras, [place], [facility]  Pager: [phone] Office: [phone]

## 2020-03-28 NOTE — ED Notes (Signed)
No answer for vitals  

## 2020-04-05 ENCOUNTER — Encounter: Payer: Self-pay | Admitting: Cardiology

## 2020-04-05 ENCOUNTER — Other Ambulatory Visit: Payer: Self-pay

## 2020-04-05 ENCOUNTER — Ambulatory Visit: Payer: No Typology Code available for payment source | Admitting: Cardiology

## 2020-04-05 VITALS — BP 125/90 | HR 83 | Resp 17 | Ht 61.0 in | Wt 196.2 lb

## 2020-04-05 DIAGNOSIS — F17200 Nicotine dependence, unspecified, uncomplicated: Secondary | ICD-10-CM

## 2020-04-05 DIAGNOSIS — Z8673 Personal history of transient ischemic attack (TIA), and cerebral infarction without residual deficits: Secondary | ICD-10-CM

## 2020-04-05 DIAGNOSIS — Z955 Presence of coronary angioplasty implant and graft: Secondary | ICD-10-CM

## 2020-04-05 DIAGNOSIS — E782 Mixed hyperlipidemia: Secondary | ICD-10-CM

## 2020-04-05 DIAGNOSIS — Z720 Tobacco use: Secondary | ICD-10-CM

## 2020-04-05 DIAGNOSIS — G4733 Obstructive sleep apnea (adult) (pediatric): Secondary | ICD-10-CM

## 2020-04-05 DIAGNOSIS — I2510 Atherosclerotic heart disease of native coronary artery without angina pectoris: Secondary | ICD-10-CM

## 2020-04-05 MED ORDER — ISOSORBIDE MONONITRATE ER 60 MG PO TB24: 60.0000 mg | ORAL_TABLET | Freq: Every day | ORAL | 1 refills | Status: DC

## 2020-04-05 MED ORDER — METOPROLOL SUCCINATE ER 50 MG PO TB24: 50.0000 mg | ORAL_TABLET | Freq: Every morning | ORAL | 1 refills | Status: DC

## 2020-04-05 NOTE — Progress Notes (Signed)
Mary Benson Date of Birth: 1970/12/24 MRN: [id number] Primary Care Provider:Spencer, Domingo Pulse, PA-C Former Cardiology Providers: Jeri Lager, APRN, FNP-C, Dr. Adrian Prows Primary Cardiologist: Rex Kras, DO, [facility] (established care 12/06/2019)  Date: 04/06/20 Last Visit: 03/09/2020  Chief Complaint  Patient presents with  . Coronary Artery Disease    Status post left heart catheterization  . Follow-up    4 weeks    HPI  Mary Benson is a 49 y.o.  female who presents to the office with a chief complaint of " reevaluation of chest pain." Patient's past medical history and cardiovascular risk factors include: migraine with aura, hypercholesterolemia, obesity, mild obstructive sleep apnea, prior history of stroke in 2018, premature coronary artery disease status post angioplasty/PCI to the LAD, active tobacco use.   Patient is noted to have nonobstructive coronary artery disease based on coronary CTA back in 2020.  Despite up titration of guideline directed medical therapy she continued to have symptoms suggestive of anginal equivalent.  She is scheduled for left heart catheterization with possible intervention on March 14, 2020.  She was noted to have obstructive CAD in the proximal/mid LAD.  She underwent angioplasty and stenting to the proximal/mid LAD.  She is now status post 2 drug-eluting stents to the LAD 3 x 26 mm resolute Onyx DES and a 2.5 x 18 mm resolute Onyx DES.  Postprocedure no acute complications.  Since last office visit patient had called back given her symptoms of mild chest tightness and elevated blood pressures and therefore was recommended to increase Imdur to 60 mg p.o. daily.  She is tolerating the change in medication well and her symptoms have improved.  Unfortunately, patient continues to smoke 1 pack every 4 days of ultralight cigarettes.  Patient appears to be motivated to stop smoking which was reemphasized again at today's office visit given her premature  coronary artery disease requiring intervention.    Patient has also increased her physical activity since her left heart catheterization and tries to walk 1 mile per day 7 days a week.  She does not have effort related chest pain or shortness of breath.    ALLERGIES: Allergies  Allergen Reactions  . Hydrocodone Other (See Comments)    ABD. PAIN  . Penicillins Hives     MEDICATION LIST PRIOR TO VISIT: Current Outpatient Medications on File Prior to Visit  Medication Sig Dispense Refill  . aspirin EC 81 MG tablet Take 1 tablet (81 mg total) by mouth daily. 150 tablet 2  . cetirizine (ZYRTEC) 10 MG tablet Take 10 mg by mouth daily as needed for allergies.     Marland Kitchen clopidogrel (PLAVIX) 75 MG tablet Take 1 tablet (75 mg total) by mouth daily. 30 tablet 6  . furosemide (LASIX) 20 MG tablet Take 1 tablet (20 mg total) by mouth 2 (two) times daily as needed (Dyspnea). 180 tablet 0  . pantoprazole (PROTONIX) 40 MG tablet TAKE 1 TABLET BY MOUTH EVERY DAY (Patient taking differently: Take 40 mg by mouth daily. ) 90 tablet 0  . rosuvastatin (CRESTOR) 10 MG tablet Take 10 mg by mouth daily.     Marland Kitchen spironolactone (ALDACTONE) 25 MG tablet Take 25 mg by mouth daily.    Marland Kitchen topiramate (TOPAMAX) 25 MG tablet Take 25 mg by mouth 2 (two) times daily as needed (headaches).     . nitroGLYCERIN (NITROSTAT) 0.4 MG SL tablet Place 1 tablet (0.4 mg total) under the tongue every 5 (five) minutes as needed for up  to 25 days for chest pain. (Patient not taking: Reported on 04/05/2020) 25 tablet 3  . sertraline (ZOLOFT) 25 MG tablet Take 25 mg by mouth 2 (two) times daily as needed (anxiety).  (Patient not taking: Reported on 04/05/2020)     No current facility-administered medications on file prior to visit.    PAST MEDICAL HISTORY: Past Medical History:  Diagnosis Date  . Anxiety   . Cancer (HCC)    cervical, uterine,   . Distal radius fracture, right 02/22/2013  . History of cervical cancer   . Laceration of  forehead 02/22/2013   sutures in place  . Seasonal allergies   . 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 ABDOMINAL HYSTERECTOMY     partial  . CESAREAN SECTION  1997  . CHOLECYSTECTOMY    . CORONARY STENT INTERVENTION N/A 03/14/2020   Procedure: CORONARY STENT INTERVENTION;  Surgeon: Adrian Prows, MD;  Location: [place] [facility];  Service: Cardiovascular;  Laterality: N/A;  . KNEE ARTHROSCOPY Right 09/2014  . LAPAROSCOPIC LYSIS OF ADHESIONS  01/31/2006  . LEFT HEART CATH AND CORONARY ANGIOGRAPHY N/A 03/14/2020   Procedure: LEFT HEART CATH AND CORONARY ANGIOGRAPHY;  Surgeon: Adrian Prows, MD;  Location: [place] [facility];  Service: Cardiovascular;  Laterality: N/A;  . OPEN REDUCTION INTERNAL FIXATION (ORIF) DISTAL RADIAL FRACTURE Right 02/24/2013   Procedure: OPEN REDUCTION INTERNAL FIXATION (ORIF) RIGHT DISTAL RADIUS FRACTURE;  Surgeon: Jolyn Nap, MD;  Location: [facility];  Service: Orthopedics;  Laterality: Right;    FAMILY HISTORY: The patient's family history includes Cervical cancer in her mother; Diabetes in her mother and sister; High blood pressure in her mother and sister.   SOCIAL HISTORY:  The patient  reports that she has been smoking cigarettes. She has a 24.00 pack-year smoking history. She has never used smokeless tobacco. She reports current alcohol use. She reports that she does not use drugs.  Review of Systems  Constitutional: Negative for chills, fever and malaise/fatigue.  HENT: Negative for hoarse voice and nosebleeds.   Eyes: Negative for discharge, double vision and pain.  Cardiovascular: Negative for chest pain, claudication, dyspnea on exertion, leg swelling, near-syncope, orthopnea, palpitations, paroxysmal nocturnal dyspnea and syncope.  Respiratory: Positive for snoring. Negative for hemoptysis and shortness of breath.     Musculoskeletal: Negative for muscle cramps and myalgias.  Gastrointestinal: Negative for abdominal pain, constipation, diarrhea, hematemesis, hematochezia, melena, nausea and vomiting.  Neurological: Negative for dizziness and light-headedness.    PHYSICAL EXAM: Vitals with BMI 04/05/2020 03/27/2020 03/14/2020  Height 5\' 1"  5\' 1"  -  Weight 196 lbs 3 oz - -  BMI 29.51 - -  Systolic 884 166 063  Diastolic 90 93 83  Pulse 83 94 74  Some encounter information is confidential and restricted. Go to Review Flowsheets activity to see all data.    Constitutional: She isoriented to person, place, and time.Vital signs are normal. She appearswell-developedand well-nourished.  HENT:  Head:Normocephalicand atraumatic.  Eyes:Conjunctivaeare normal.  Cardiovascular:Normal rate,regular rhythm,normal heart soundsand intact distal pulses.  Pulmonary/Chest:Effort normaland breath sounds normal. Noaccessory muscle usage. Norespiratory distress.  Abdominal:Soft.Bowel sounds are normal.  Neurological: She isalertand oriented to person, place, and time.  Skin: Skin iswarmand dry.  Psychiatric: She has anormal mood and affect.  RADIOLOGY: Coronary CTA 03/02/2019: Non-cardiac:  Normal. Pulmonary veins drain normally to the left atrium. Calcium  Score: 35 Agatston units. Coronary Arteries: Right dominant with no anomalies  LM: No plaque or stenosis. LAD system: Mixed plaque proximal LAD, mild (<50%) stenosis. Circumflex system: Calcified plaque proximal LCx, no significant stenosis. RCA system: No plaque or stenosis.  IMPRESSION: 1. Coronary artery calcium score 35 Agatston units. This places the patient in the 98th percentile for age and gender, suggesting high risk for future cardiac events. 2. Mild stenosis in the proximal LAD.  CARDIAC DATABASE: EKG: 04/05/2020: Sinus  Rhythm, 68 bpm, normal axis, poor R wave progression, without underlying ischemia or injury  pattern.  Echocardiogram: 02/12/2019: LVEF 55%. Left ventricle cavity is normal in size. Mild concentric hypertrophy of the left ventricle. Grade II diastolic dysfunction, elevated LAP. Calculated EF 55%. Trace AR. Mild TR, RVSP 84mmHG,   Stress Testing:  Lexiscan/Modified BruceSestamibi stress test 01/10/2020: Stress LVEF 62%. Normal myocardial perfusion. TID ratio 1.31. In absence of regional myocardial perfusion defects, this is a nonspecific finding. Clinical correaltion. Low risk study.   Heart Catheterization: Left Heart Catheterization 03/14/20:  LV: Normal LV systolic function.  Normal LVEDP.  No wall motion abnormality. RCA: Dominant.  Normal. Left main: Normal. Circumflex: Moderate to large vessel, minimal luminal irregularity. LAD: Acute angle takeoff, focal tandem 90% and a 80% stenosis in proximal LAD with large D1 in between.  Mid to distal LAD has mild disease. IVUS: High-grade stenosis in the proximal LAD constituting 2.55mm area at the site of stenosis.  Total lesion length measured 24 mm.  There was diffuse disease in the mid to distal LAD. Intervention: Successful IVUS guided PTCA and stenting of the proximal and mid LAD with implantation of a 3.0 x 26 mm resolute Onyx DES, deployed at 12 atmospheric pressure for 45 seconds, proximal segment postdilated with a 3.5 x 12 mm noncompliant sapphire.  Post intervention angiography revealed at least a 60% stenosis distal to the proximal stent in the mid segment of the LAD.  SP 2.5 x 18 mm resolute Onyx DES deployed at 12 atmospheric pressure, same stent balloon utilized to pull proximally into the stent struts overlapping previous stent and a 16 atmospheric pressure inflation x60 seconds was performed.  Overall stenosis reduced to 0% with maintenance of TIMI-3 to TIMI-3 flow. Recommendation: Patient will need dual antiplatelet therapy for at least a period of 6 months followed by aspirin alone.  Smoking cessation discussed.  Carotid  duplex: None  Vascular imaging: Lower Extremity Arterial Duplex 01/12/2020: No hemodynamically significant stenoses are identified in the lower extremity arterial system.  There is mild diffuse disease noted in the below knee vessels with monophasic waveform at the left ankle in AT vessel.  Non compressible vessels due to medial sclerosis.  Consider evaluation for pseudoclaudication.  Holter Monitor for 48 hours  02/01/2019: No symptoms reported.  There are occasional PACs.  Uneventful Holter monitor.  LABORATORY DATA: CBC Latest Ref Rng & Units 03/27/2020 03/10/2020 06/08/2017  WBC 4.0 - 10.5 K/uL 11.2(H) 11.4(H) 16.2(H)  Hemoglobin 12.0 - 15.0 g/dL 13.6 15.3 14.7  Hematocrit 36 - 46 % 40.0 46.8(H) 41.3  Platelets 150 - 400 K/uL 408(H) 395 346    CMP Latest Ref Rng & Units 03/27/2020 03/06/2020 06/17/2019  Glucose 70 - 99 mg/dL 144(H) 135(H) 111(H)  BUN 6 - 20 mg/dL 20 18 17   Creatinine 0.44 - 1.00 mg/dL 1.10(H) 0.87 0.91  Sodium 135 - 145 mmol/L 138 144 139  Potassium 3.5 - 5.1 mmol/L 3.4(L) 3.8 3.9  Chloride 98 - 111 mmol/L 106 106 101  CO2 22 - 32 mmol/L 21(L) 22 22  Calcium 8.9 - 10.3 mg/dL 9.1 9.4 9.4  Total Protein 6.5 - 8.1 g/dL - - -  Total Bilirubin 0.3 - 1.2 mg/dL - - -  Alkaline Phos 38 - 126 U/L - - -  AST 15 - 41 U/L - - -  ALT 14 - 54 U/L - - -    Lipid Panel  No results found for: CHOL, TRIG, HDL, CHOLHDL, VLDL, LDLCALC, LDLDIRECT, LABVLDL  No results found for: HGBA1C No components found for: NTPROBNP No results found for: TSH  Cardiac Panel (last 3 results) No results for input(s): CKTOTAL, CKMB, TROPONINIHS, RELINDX in the last 72 hours.  IMPRESSION:    ICD-10-CM   1. Atherosclerosis of native coronary artery of native heart without angina pectoris  I25.10 EKG 12-Lead    isosorbide mononitrate (IMDUR) 60 MG 24 hr tablet    metoprolol succinate (TOPROL-XL) 50 MG 24 hr tablet  2. Status post coronary artery stent placement  Z95.5   3. Coronary artery  calcification seen on computed tomography  I25.10   4. Mixed hyperlipidemia  E78.2 Lipid Panel With LDL/HDL Ratio  5. Tobacco abuse  Z72.0   6. OSA (obstructive sleep apnea)  G47.33   7. H/O: CVA (cerebrovascular accident) no residual deficit 2018  Z86.73   8. Tobacco use disorder  F17.200      RECOMMENDATIONS: Mary Benson is a 49 y.o. female whose past medical history and cardiovascular risk factors include: migraine with aura, hypercholesterolemia, obesity, mild obstructive sleep apnea, prior history of stroke in 2018, active tobacco use, and nonobstructive coronary artery disease per coronary CTA.   Atherosclerosis of the native coronary artery without angina pectoris status post angioplasty and stent:   Patient underwent left heart catheterization with intervention with 2 drug-eluting stents to the proximal/mid LAD as discussed above.    As per interventional cardiology recommendations patient verbalizes understanding that she should be on dual antiplatelet therapy for at least 6 months and thereafter aspirin indefinitely.  We will rediscuss this during the follow-up visits.    Continue aspirin and Plavix.  Continue Lasix.  Continue Aldactone.  Start Toprol-XL 50 mg p.o. daily.  Continue rosuvastatin.  Continue Imdur 60 mg p.o. daily  Educated on the importance of complete smoking cessation.  Patient is encouraged to increase physical activity as tolerated with a goal of 30 minutes a day 5 days a week.  Status post angioplasty and stenting: See above  Coronary artery calcification: Continue aspirin and statin therapy.  See above  Mixed hyperlipidemia: Patient does not want to uptitrate simvastatin at this time.  However due to the newly diagnosed obstructive CAD recommend checking fasting lipid profile to help tailor statin therapy.  Tobacco use disorder: Educated on importance of complete smoking cessation.    FINAL MEDICATION LIST END OF ENCOUNTER: Meds ordered  this encounter  Medications  . isosorbide mononitrate (IMDUR) 60 MG 24 hr tablet    Sig: Take 1 tablet (60 mg total) by mouth daily.    Dispense:  90 tablet    Refill:  1  . metoprolol succinate (TOPROL-XL) 50 MG 24 hr tablet    Sig: Take 1 tablet (50 mg total) by mouth in the morning. Take with or immediately following a meal.    Dispense:  90 tablet    Refill:  1     Current Outpatient Medications:  .  aspirin EC 81 MG tablet, Take 1 tablet (81 mg total) by  mouth daily., Disp: 150 tablet, Rfl: 2 .  cetirizine (ZYRTEC) 10 MG tablet, Take 10 mg by mouth daily as needed for allergies. , Disp: , Rfl:  .  clopidogrel (PLAVIX) 75 MG tablet, Take 1 tablet (75 mg total) by mouth daily., Disp: 30 tablet, Rfl: 6 .  furosemide (LASIX) 20 MG tablet, Take 1 tablet (20 mg total) by mouth 2 (two) times daily as needed (Dyspnea)., Disp: 180 tablet, Rfl: 0 .  pantoprazole (PROTONIX) 40 MG tablet, TAKE 1 TABLET BY MOUTH EVERY DAY (Patient taking differently: Take 40 mg by mouth daily. ), Disp: 90 tablet, Rfl: 0 .  rosuvastatin (CRESTOR) 10 MG tablet, Take 10 mg by mouth daily. , Disp: , Rfl:  .  spironolactone (ALDACTONE) 25 MG tablet, Take 25 mg by mouth daily., Disp: , Rfl:  .  topiramate (TOPAMAX) 25 MG tablet, Take 25 mg by mouth 2 (two) times daily as needed (headaches). , Disp: , Rfl:  .  isosorbide mononitrate (IMDUR) 60 MG 24 hr tablet, Take 1 tablet (60 mg total) by mouth daily., Disp: 90 tablet, Rfl: 1 .  metoprolol succinate (TOPROL-XL) 50 MG 24 hr tablet, Take 1 tablet (50 mg total) by mouth in the morning. Take with or immediately following a meal., Disp: 90 tablet, Rfl: 1 .  nitroGLYCERIN (NITROSTAT) 0.4 MG SL tablet, Place 1 tablet (0.4 mg total) under the tongue every 5 (five) minutes as needed for up to 25 days for chest pain. (Patient not taking: Reported on 04/05/2020), Disp: 25 tablet, Rfl: 3 .  sertraline (ZOLOFT) 25 MG tablet, Take 25 mg by mouth 2 (two) times daily as needed (anxiety).   (Patient not taking: Reported on 04/05/2020), Disp: , Rfl:   Orders Placed This Encounter  Procedures  . Lipid Panel With LDL/HDL Ratio  . EKG 12-Lead   --Continue cardiac medications as reconciled in final medication list. --Return in about 3 months (around 07/06/2020) for CAD evaluation. . Or sooner if needed. --Continue follow-up with your primary care physician regarding the management of your other chronic comorbid conditions.  Patient's questions and concerns were addressed to her satisfaction. She voices understanding of the instructions provided during this encounter.   This note was created using a voice recognition software as a result there may be grammatical errors inadvertently enclosed that do not reflect the nature of this encounter. Every attempt is made to correct such errors.  Rex Kras, [place], [facility]  Pager: [phone] Office: [phone]

## 2020-04-07 ENCOUNTER — Ambulatory Visit: Payer: No Typology Code available for payment source | Admitting: Cardiology

## 2020-05-09 ENCOUNTER — Other Ambulatory Visit: Payer: Self-pay | Admitting: Cardiology

## 2020-05-11 ENCOUNTER — Other Ambulatory Visit: Payer: Self-pay

## 2020-05-11 MED ORDER — CLOPIDOGREL BISULFATE 75 MG PO TABS: 75.0000 mg | ORAL_TABLET | Freq: Every day | ORAL | 1 refills | Status: DC

## 2020-05-25 ENCOUNTER — Telehealth (HOSPITAL_COMMUNITY): Payer: Self-pay

## 2020-05-25 NOTE — Telephone Encounter (Signed)
Called patient to see if she is interested in the Cardiac Rehab Program. Patient stated she is interested but she has to check with her work [employer] b/c of her work schedule. Stated she will give [place] a call back.   Will follow up with pt if she does not call back in a week. Went over insurance, patient verbalized understanding.

## 2020-05-25 NOTE — Telephone Encounter (Signed)
Pt returned CR phone call and stated she would like to come in for CR. She will come in for orientation on 06/22/20 @ 2PM and will attend the 3PM class.  Mailed letter

## 2020-05-25 NOTE — Telephone Encounter (Signed)
Pt insurance is active and benefits verified through [facility]. Co-pay $0.00, DED $3,500.00/$3,500.00 met, out of pocket $7,350.00/$7,350.00 met, co-insurance 20%. No pre-authorization required. Ai A./[employer], 05/25/20 @ 11:50AM, IWL#[id number] AiA.  Will contact patient to see if she is interested in the Cardiac Rehab Program.

## 2020-05-27 ENCOUNTER — Encounter (HOSPITAL_COMMUNITY): Payer: Self-pay

## 2020-05-27 ENCOUNTER — Other Ambulatory Visit: Payer: Self-pay

## 2020-05-27 ENCOUNTER — Emergency Department (HOSPITAL_COMMUNITY): Payer: No Typology Code available for payment source

## 2020-05-27 ENCOUNTER — Telehealth: Payer: Self-pay | Admitting: Cardiology

## 2020-05-27 ENCOUNTER — Emergency Department (HOSPITAL_COMMUNITY)
Admission: EM | Admit: 2020-05-27 | Discharge: 2020-05-27 | Disposition: A | Discharge status: Home / Self Care | Payer: No Typology Code available for payment source | Attending: Emergency Medicine | Admitting: Emergency Medicine

## 2020-05-27 DIAGNOSIS — M79602 Pain in left arm: Secondary | ICD-10-CM | POA: Insufficient documentation

## 2020-05-27 DIAGNOSIS — R6884 Jaw pain: Secondary | ICD-10-CM | POA: Insufficient documentation

## 2020-05-27 DIAGNOSIS — Z5321 Procedure and treatment not carried out due to patient leaving prior to being seen by health care provider: Secondary | ICD-10-CM | POA: Insufficient documentation

## 2020-05-27 DIAGNOSIS — R0789 Other chest pain: Secondary | ICD-10-CM

## 2020-05-27 DIAGNOSIS — M542 Cervicalgia: Secondary | ICD-10-CM | POA: Insufficient documentation

## 2020-05-27 DIAGNOSIS — I25118 Atherosclerotic heart disease of native coronary artery with other forms of angina pectoris: Secondary | ICD-10-CM

## 2020-05-27 LAB — CBC
HCT: 43 % (ref 36.0–46.0)
Hemoglobin: 14.4 g/dL (ref 12.0–15.0)
MCH: 30.1 pg (ref 26.0–34.0)
MCHC: 33.5 g/dL (ref 30.0–36.0)
MCV: 89.8 fL (ref 80.0–100.0)
Platelets: 387 10*3/uL (ref 150–400)
RBC: 4.79 MIL/uL (ref 3.87–5.11)
RDW: 13.3 % (ref 11.5–15.5)
WBC: 10.8 10*3/uL — ABNORMAL HIGH (ref 4.0–10.5)
nRBC: 0 % (ref 0.0–0.2)

## 2020-05-27 LAB — BASIC METABOLIC PANEL
Anion gap: 12 (ref 5–15)
BUN: 16 mg/dL (ref 6–20)
CO2: 22 mmol/L (ref 22–32)
Calcium: 9.2 mg/dL (ref 8.9–10.3)
Chloride: 106 mmol/L (ref 98–111)
Creatinine, Ser: 1 mg/dL (ref 0.44–1.00)
GFR calc Af Amer: >60 mL/min (ref >60)
GFR calc non Af Amer: >60 mL/min (ref >60)
Glucose, Bld: 95 mg/dL (ref 70–99)
Potassium: 3.8 mmol/L (ref 3.5–5.1)
Sodium: 140 mmol/L (ref 135–145)

## 2020-05-27 LAB — TROPONIN I (HIGH SENSITIVITY): Troponin I (High Sensitivity): 9 ng/L (ref <18)

## 2020-05-27 NOTE — Telephone Encounter (Signed)
Patient stated that she had severe chest pain today, on and off, states that it is 9-10 out of 10 in intensity, chest pain is intense and crushing-like sensation, with radiation to the jaw.  Also has worsening of chest pain when she takes a deep breath.  Started all of a sudden today.  No hemoptysis, no leg edema, went to the emergency room.  EKG was normal, she was made to wait for 2-1/2 hours outside and she left.  Page me to discuss her symptoms.  I reviewed her EKG, normal sinus rhythm without ischemia.  Chest pain symptoms appear musculoskeletal but I cannot certainly exclude ACS.  Advised her to take an extra dose of isosorbide mononitrate 60 mg and also metoprolol succinate 50 mg 1/2 tablet additional today and 2 extra strength Tylenol and see whether she feels better in the next hour or so.  Otherwise advised her that there is not much I can do over the telephone that she has to be in the emergency room and to call me if she has continued chest discomfort or recurrence of chest discomfort.  Otherwise I will see her back in the office on Monday. 12 minute tel encounter.    ICD-10-CM   1. Atypical chest pain  R07.89   2. Coronary artery disease involving native coronary artery of native heart with other form of angina pectoris ([place])  I25.118       Adrian Prows, MD, [facility] 05/27/2020, 1:54 PM Office: [phone]

## 2020-05-27 NOTE — ED Notes (Signed)
Pt raising voice at staff. Stated she was leaving and going to another hospital.

## 2020-05-27 NOTE — ED Triage Notes (Signed)
Patient complains of left anterior chest pain with radiation to left arm, neck and jaw while talking with family this am. Patient with relief after taking 1 sl ntg and EMS administered 4 baby asa. States all pain has resolved. Had stents placed 6 weeks ago

## 2020-05-29 ENCOUNTER — Ambulatory Visit: Payer: No Typology Code available for payment source | Admitting: Cardiology

## 2020-05-29 ENCOUNTER — Other Ambulatory Visit: Payer: Self-pay

## 2020-05-29 ENCOUNTER — Encounter: Payer: Self-pay | Admitting: Cardiology

## 2020-05-29 VITALS — BP 129/82 | HR 73 | Resp 16 | Ht 61.0 in | Wt 198.0 lb

## 2020-05-29 DIAGNOSIS — E782 Mixed hyperlipidemia: Secondary | ICD-10-CM

## 2020-05-29 DIAGNOSIS — R0789 Other chest pain: Secondary | ICD-10-CM

## 2020-05-29 DIAGNOSIS — I25118 Atherosclerotic heart disease of native coronary artery with other forms of angina pectoris: Secondary | ICD-10-CM

## 2020-05-29 MED ORDER — EZETIMIBE 10 MG PO TABS: 10.0000 mg | ORAL_TABLET | Freq: Every day | ORAL | 2 refills | Status: DC

## 2020-05-29 MED ORDER — ROSUVASTATIN CALCIUM 20 MG PO TABS: 20.0000 mg | ORAL_TABLET | Freq: Every day | ORAL | 3 refills | Status: DC

## 2020-05-29 NOTE — Progress Notes (Signed)
Primary Physician/Referring:  Selinda Orion  Patient ID: Mary Benson, female    DOB: [birth year], 49 y.o.   MRN: [id number]  Chief Complaint  Patient presents with  . Chest Pain  . Hospitalization Follow-up   HPI:    Mary Benson  is a 49 y.o.  female  with past medical history is significant for migraine with aura, hypercholesterolemia, hyperglycemia, mild obesity, mild obstructive sleep apnea by sleep evaluation in February 2020 and unable to tolerate CPAP and also restless leg syndrome, prior history of stroke in August 2018, when she presented with speech difficulty and left sided weakness and resolved in a few hours.    She also has tobacco use disorder, has quit smoking since 03/14/2020 after angioplasty to her proximal and mid LAD.  She has had prior history of seizure disorder but has been seizure-free since 2012.  She had called me over the weekend 2 days ago stating that she had severe chest pain and presented to the emergency room where for set of cardiac troponin was negative, EKG essentially revealed normal sinus rhythm, she was made to wait in the lobby due to COVID-19 volume, patient walked out.  I had discussed with her regarding her presentation from the emergency room after evaluation of her medical records that her chest pain symptoms are mostly musculoskeletal and recommended taking 1 extra dose of isosorbide mononitrate along with Tylenol 2 tablets.  She has had resolution of pain.  Chest pain described as severe stabbing pain in the middle of the chest with radiation to her neck, hard to breathe making chest pain worse on taking deep breath.  She has not had any hemoptysis, no leg edema.  Past Medical History:  Diagnosis Date  . Anxiety   . Cancer (HCC)    cervical, uterine,   . Distal radius fracture, right 02/22/2013  . History of cervical cancer   . Laceration of forehead 02/22/2013   sutures in place  . Seasonal allergies   . Seizures ([place])     last seizure > 1 yr.; seizures caused by head trauma   Past Surgical History:  Procedure Laterality Date  . ABDOMINAL HYSTERECTOMY     partial  . CESAREAN SECTION  1997  . CHOLECYSTECTOMY    . CORONARY STENT INTERVENTION N/A 03/14/2020   Procedure: CORONARY STENT INTERVENTION;  Surgeon: Adrian Prows, MD;  Location: [facility];  Service: Cardiovascular;  Laterality: N/A;  . KNEE ARTHROSCOPY Right 09/2014  . LAPAROSCOPIC LYSIS OF ADHESIONS  01/31/2006  . LEFT HEART CATH AND CORONARY ANGIOGRAPHY N/A 03/14/2020   Procedure: LEFT HEART CATH AND CORONARY ANGIOGRAPHY;  Surgeon: Adrian Prows, MD;  Location: [facility];  Service: Cardiovascular;  Laterality: N/A;  . OPEN REDUCTION INTERNAL FIXATION (ORIF) DISTAL RADIAL FRACTURE Right 02/24/2013   Procedure: OPEN REDUCTION INTERNAL FIXATION (ORIF) RIGHT DISTAL RADIUS FRACTURE;  Surgeon: Jolyn Nap, MD;  Location: [facility];  Service: Orthopedics;  Laterality: Right;   Family History  Problem Relation Age of Onset  . Cervical cancer Mother   . Diabetes Mother   . High blood pressure Mother   . Diabetes Sister   . High blood pressure Sister     Social History   Tobacco Use  . Smoking status: Light Tobacco Smoker    Packs/day: 1.00    Years: 24.00    Pack years: 24.00    Types: Cigarettes  . Smokeless tobacco: Never Used  Substance Use Topics  .  Alcohol use: Yes    Alcohol/week: 0.0 standard drinks    Comment: occasionally   Marital Status: Single  ROS  Review of Systems  Cardiovascular: Positive for chest pain and dyspnea on exertion. Negative for leg swelling.  Gastrointestinal: Negative for melena.   Objective  Blood pressure 129/82, pulse 73, resp. rate 16, height 5' 1"  (1.549 m), weight 198 lb (89.8 kg), SpO2 98 %.  Vitals with BMI 05/29/2020 04/05/2020 03/27/2020  Height 5' 1"  5' 1"  5' 1"   Weight 198 lbs 196 lbs 3 oz -  BMI 38.18 29.93 -  Systolic 716 967 893  Diastolic 82 90 93  Pulse 73 83  94  Some encounter information is confidential and restricted. Go to Review Flowsheets activity to see all data.     Physical Exam Constitutional:      Appearance: She is obese.  Cardiovascular:     Rate and Rhythm: Normal rate and regular rhythm.     Pulses: Intact distal pulses.     Heart sounds: Normal heart sounds. No murmur heard.  No gallop.      Comments: No leg edema, no JVD. Pulmonary:     Effort: Pulmonary effort is normal.     Breath sounds: Normal breath sounds.  Abdominal:     General: Bowel sounds are normal.     Palpations: Abdomen is soft.    Laboratory examination:   Recent Labs    03/06/20 1338 03/27/20 2138 05/27/20 1253  NA 144 138 140  K 3.8 3.4* 3.8  CL 106 106 106  CO2 22 21* 22  GLUCOSE 135* 144* 95  BUN 18 20 16   CREATININE 0.87 1.10* 1.00  CALCIUM 9.4 9.1 9.2  GFRNONAA 80 >60 >60  GFRAA 92 >60 >60   estimated creatinine clearance is 71.7 mL/min (by C-G formula based on SCr of 1 mg/dL).  CMP Latest Ref Rng & Units 05/27/2020 03/27/2020 03/06/2020  Glucose 70 - 99 mg/dL 95 144(H) 135(H)  BUN 6 - 20 mg/dL 16 20 18   Creatinine 0.44 - 1.00 mg/dL 1.00 1.10(H) 0.87  Sodium 135 - 145 mmol/L 140 138 144  Potassium 3.5 - 5.1 mmol/L 3.8 3.4(L) 3.8  Chloride 98 - 111 mmol/L 106 106 106  CO2 22 - 32 mmol/L 22 21(L) 22  Calcium 8.9 - 10.3 mg/dL 9.2 9.1 9.4  Total Protein 6.5 - 8.1 g/dL - - -  Total Bilirubin 0.3 - 1.2 mg/dL - - -  Alkaline Phos 38 - 126 U/L - - -  AST 15 - 41 U/L - - -  ALT 14 - 54 U/L - - -   CBC Latest Ref Rng & Units 05/27/2020 03/27/2020 03/10/2020  WBC 4.0 - 10.5 K/uL 10.8(H) 11.2(H) 11.4(H)  Hemoglobin 12.0 - 15.0 g/dL 14.4 13.6 15.3  Hematocrit 36 - 46 % 43.0 40.0 46.8(H)  Platelets 150 - 400 K/uL 387 408(H) 395    Lipid Panel No results for input(s): CHOL, TRIG, LDLCALC, VLDL, HDL, CHOLHDL, LDLDIRECT in the last 8760 hours.    HEMOGLOBIN A1C No results found for: HGBA1C, MPG TSH No results for input(s): TSH in the last  8760 hours.   External labs:   Labs 05/08/2020:  Serum glucose 118 mg, BUN 13, creatinine 0.87, EGFR 80 mL.  CMP otherwise normal.  Total cholesterol 246, triglycerides 333, HDL 39, direct LDL 145.  Medications and allergies   Allergies  Allergen Reactions  . Hydrocodone Other (See Comments)    ABD. PAIN  . Penicillins Hives  Outpatient Medications Prior to Visit  Medication Sig Dispense Refill  . aspirin EC 81 MG tablet Take 1 tablet (81 mg total) by mouth daily. 150 tablet 2  . cetirizine (ZYRTEC) 10 MG tablet Take 10 mg by mouth daily as needed for allergies.     Marland Kitchen clopidogrel (PLAVIX) 75 MG tablet Take 1 tablet (75 mg total) by mouth daily. 90 tablet 1  . furosemide (LASIX) 20 MG tablet Take 1 tablet (20 mg total) by mouth 2 (two) times daily as needed (Dyspnea). 180 tablet 0  . isosorbide mononitrate (IMDUR) 60 MG 24 hr tablet Take 1 tablet (60 mg total) by mouth daily. 90 tablet 1  . metoprolol succinate (TOPROL-XL) 50 MG 24 hr tablet Take 1 tablet (50 mg total) by mouth in the morning. Take with or immediately following a meal. 90 tablet 1  . nitroGLYCERIN (NITROSTAT) 0.4 MG SL tablet Place 1 tablet (0.4 mg total) under the tongue every 5 (five) minutes as needed for up to 25 days for chest pain. 25 tablet 3  . pantoprazole (PROTONIX) 40 MG tablet TAKE 1 TABLET BY MOUTH EVERY DAY (Patient taking differently: Take 40 mg by mouth daily. ) 90 tablet 0  . sertraline (ZOLOFT) 25 MG tablet Take 25 mg by mouth 2 (two) times daily as needed (anxiety).     Marland Kitchen spironolactone (ALDACTONE) 25 MG tablet TAKE 1 TABLET BY MOUTH EVERY DAY 30 tablet 2  . topiramate (TOPAMAX) 25 MG tablet Take 25 mg by mouth 2 (two) times daily as needed (headaches).     . rosuvastatin (CRESTOR) 10 MG tablet Take 10 mg by mouth daily.      No facility-administered medications prior to visit.    Radiology:   Chest x-ray PA and lateral view 05/27/2020: Mild atelectasis in the left base. The heart, hila,  mediastinum, lungs, and pleura are normal. IMPRESSION: No active cardiopulmonary disease.   Cardiac Studies:   Echocardiogram: 02/12/2019: LVEF 55%. Left ventricle cavity is normal in size. Mild concentric hypertrophy of the left ventricle. Grade II diastolic dysfunction, elevated LAP. Calculated EF 55%. Trace AR. Mild TR, RVSP 52mHG,   Stress Testing:  Lexiscan/Modified BruceSestamibi stress test 01/10/2020: Stress LVEF 62%. Normal myocardial perfusion. TID ratio 1.31. In absence of regional myocardial perfusion defects, this is a nonspecific finding. Clinical correaltion. Low risk study.   Vascular imaging: Lower Extremity Arterial Duplex 01/12/2020: No hemodynamically significant stenoses are identified in the lower extremity arterial system.  There is mild diffuse disease noted in the below knee vessels with monophasic waveform at the left ankle in AT vessel.  Non compressible vessels due to medial sclerosis.  Consider evaluation for pseudoclaudication.  Holter Monitor for 48 hours  02/01/2019: No symptoms reported.  There are occasional PACs.  Uneventful Holter monitor. EKG  Heart Catheterization: Left Heart Catheterization 03/14/20:  LV: Normal LV systolic function.  Normal LVEDP.  No wall motion abnormality. RCA: Dominant.  Normal. Left main: Normal. Circumflex: Moderate to large vessel, minimal luminal irregularity. LAD: Acute angle takeoff, focal tandem 90% and a 80% stenosis in proximal LAD with large D1 in between.  Mid to distal LAD has mild disease. IVUS: High-grade stenosis in the proximal LAD constituting 2.237marea at the site of stenosis.  Total lesion length measured 24 mm.  There was diffuse disease in the mid to distal LAD. Intervention: Successful IVUS guided PTCA and stenting of the proximal and mid LAD with implantation of a 3.0 x 26 mm resolute Onyx DES, deployed at  12 atmospheric pressure for 45 seconds, proximal segment postdilated with a 3.5 x 12 mm  noncompliant sapphire.  Post intervention angiography revealed at least a 60% stenosis distal to the proximal stent in the mid segment of the LAD.  SP 2.5 x 18 mm resolute Onyx DES deployed at 12 atmospheric pressure, same stent balloon utilized to pull proximally into the stent struts overlapping previous stent and a 16 atmospheric pressure inflation x60 seconds was performed.  Overall stenosis reduced to 0% with maintenance of TIMI-3 to TIMI-3 flow. Recommendation: Patient will need dual antiplatelet therapy for at least a period of 6 months followed by aspirin alone.  Smoking cessation discussed.  EKG:  EKG 05/29/2020: Normal sinus rhythm with rate of 64 bpm, normal axis, no evidence of ischemia, normal EKG.      Assessment     ICD-10-CM   1. Coronary artery disease involving native coronary artery of native heart with other form of angina pectoris (HCC)  I25.118 rosuvastatin (CRESTOR) 20 MG tablet    EKG 12-Lead  2. Chest pain, musculoskeletal  R07.89   3. Mixed hyperlipidemia  E78.2 rosuvastatin (CRESTOR) 20 MG tablet    ezetimibe (ZETIA) 10 MG tablet    LDL cholesterol, direct    Lipid Panel With LDL/HDL Ratio     Medications Discontinued During This Encounter  Medication Reason  . rosuvastatin (CRESTOR) 10 MG tablet Reorder    Meds ordered this encounter  Medications  . rosuvastatin (CRESTOR) 20 MG tablet    Sig: Take 1 tablet (20 mg total) by mouth daily.    Dispense:  90 tablet    Refill:  3  . ezetimibe (ZETIA) 10 MG tablet    Sig: Take 1 tablet (10 mg total) by mouth daily after supper.    Dispense:  30 tablet    Refill:  2    Recommendations:   Mary Benson is a 49 y.o.  female  with past medical history is significant for migraine with aura, hypercholesterolemia, hyperglycemia, mild obesity, mild obstructive sleep apnea by sleep evaluation in February 2020 and unable to tolerate CPAP and also restless leg syndrome, prior history of stroke in August 2018, when  she presented with speech difficulty and left sided weakness and resolved in a few hours.    She also has tobacco use disorder, has quit smoking since 03/14/2020 after angioplasty to her proximal and mid LAD.  She has had prior history of seizure disorder but has been seizure-free since 2012.  She had called me over the weekend about chest pain, chest pain symptoms appear to be musculoskeletal and do not suspect unstable angina.  I reviewed her medical records from the emergency room and also I compared the EKG, no significant change.  I reassured her and advised her that she could always take 1 extra dose of isosorbide mononitrate and also 2 extra strength Tylenol if she were to have sharp chest pain similar to the one that she had before.  I reviewed her recent labs from her PCP, LDL is not at goal.  I have discussed with her regarding mixed hyperlipidemia, elevated triglycerides being related to excessive carbohydrate intake and also cheese.  She will try to make lifestyle changes.  I will increase her Crestor from 10 mg to 20 mg but will also go ahead and add 10 mg of Zetia and obtain lipid profile testing in 4 to 6 weeks.  I would like to see him back in 2 months for follow-up of her  symptoms of chest pain and also lipids.  She has mostly remain abstinent from tobacco except 2 days ago she did smoke 2 cigarettes.  Advised her complete abstinence.   Adrian Prows, MD, [facility] 05/29/2020, 5:38 PM Office: [phone]

## 2020-05-29 NOTE — Patient Instructions (Signed)
Blood work in 4-6 weeks Oct 1 st week

## 2020-05-31 ENCOUNTER — Telehealth (HOSPITAL_COMMUNITY): Payer: Self-pay | Admitting: *Deleted

## 2020-06-07 ENCOUNTER — Ambulatory Visit: Payer: No Typology Code available for payment source | Admitting: Cardiology

## 2020-06-07 ENCOUNTER — Encounter: Payer: Self-pay | Admitting: Cardiology

## 2020-06-07 ENCOUNTER — Other Ambulatory Visit: Payer: Self-pay

## 2020-06-07 VITALS — BP 120/79 | HR 71 | Resp 16 | Ht 61.0 in | Wt 198.0 lb

## 2020-06-07 DIAGNOSIS — R0789 Other chest pain: Secondary | ICD-10-CM

## 2020-06-07 DIAGNOSIS — Z955 Presence of coronary angioplasty implant and graft: Secondary | ICD-10-CM

## 2020-06-07 DIAGNOSIS — E782 Mixed hyperlipidemia: Secondary | ICD-10-CM

## 2020-06-07 DIAGNOSIS — Z720 Tobacco use: Secondary | ICD-10-CM

## 2020-06-07 DIAGNOSIS — I25118 Atherosclerotic heart disease of native coronary artery with other forms of angina pectoris: Secondary | ICD-10-CM

## 2020-06-07 DIAGNOSIS — G4733 Obstructive sleep apnea (adult) (pediatric): Secondary | ICD-10-CM

## 2020-06-07 NOTE — Progress Notes (Signed)
[name] Date of Birth: [birth year] MRN: [id number] Primary Care Provider:Spencer, Domingo Pulse, PA-C Former Cardiology Providers: Jeri Lager, APRN, FNP-C, Dr. Adrian Prows Primary Cardiologist: Rex Kras, DO, [facility] (established care 12/06/2019)  Date: 06/07/20 Last Office Visit: 04/05/2020  Chief Complaint  Patient presents with  . Coronary Artery Disease    HPI  Mary Benson is a 49 y.o.  female who presents to the office with a chief complaint of " reevaluation of chest pain." Patient's past medical history and cardiovascular risk factors include: migraine with aura, hypercholesterolemia, obesity, mild obstructive sleep apnea, prior history of stroke in 2018, premature coronary artery disease status post angioplasty/PCI to the LAD, active tobacco use.   Patient is noted to have nonobstructive coronary artery disease based on coronary CTA back in 2020.  Despite up titration of guideline directed medical therapy she continued to have symptoms suggestive of anginal equivalent.  She is scheduled for left heart catheterization with possible intervention on March 14, 2020.  She was noted to have obstructive CAD in the proximal/mid LAD.  She underwent angioplasty and stenting to the proximal/mid LAD.  She is now status post 2 drug-eluting stents to the LAD 3 x 26 mm resolute Onyx DES and a 2.5 x 18 mm resolute Onyx DES.  Patient did well post procedure without any symptoms.   Recently patient has been having reoccurrence of atypical chest pain.  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  However due to the COVID-19 pandemic ER being full and prolonged wait times patient decided to go home after blood work was performed.  Patient followed up with my partner Dr. Einar Gip who recommended increasing the Imdur to 60 mg p.o. daily and also uptitrating statin therapy.   Since then patient states that her symptoms are very well controlled without reoccurrence.  Unfortunately patient continues to smoke.  I did educate her that smoking can cause coronary vasospasm which may be contributing to her symptoms.  Patient states that she is hopeful on quitting soon.  Patient starts cardiac rehab on June 22, 2020 which should help her getting more active in a comfortable fashion.  Patient also has an appointment with gastroenterology at the end of the month.  ALLERGIES: Allergies  Allergen Reactions  . Hydrocodone Other (See Comments)    ABD. PAIN  . Penicillins Hives     MEDICATION LIST PRIOR TO VISIT: Current Outpatient Medications on File Prior to Visit  Medication Sig Dispense Refill  . aspirin EC 81 MG tablet Take 1 tablet (81 mg total) by mouth daily. 150 tablet 2  . cetirizine (ZYRTEC) 10 MG tablet Take 10 mg by mouth daily as needed for allergies.     Marland Kitchen clopidogrel (PLAVIX) 75 MG tablet Take 1 tablet (75 mg total) by mouth daily. 90 tablet 1  . ezetimibe (ZETIA) 10 MG tablet Take 1 tablet (10 mg total) by mouth daily after supper. 30 tablet 2  . furosemide (LASIX) 20 MG tablet Take 1 tablet (20 mg total) by mouth 2 (two) times daily as needed (Dyspnea). 180 tablet 0  . isosorbide mononitrate (IMDUR) 60 MG 24 hr tablet Take 1 tablet (60 mg total) by mouth daily. 90 tablet 1  . metoprolol succinate (TOPROL-XL) 50 MG 24 hr tablet Take 1 tablet (50 mg total) by mouth in the morning. Take with or immediately  following a meal. 90 tablet 1  . pantoprazole (PROTONIX) 40 MG tablet TAKE 1 TABLET BY MOUTH EVERY DAY (Patient taking differently: Take 40 mg by mouth daily. ) 90 tablet 0  . rosuvastatin (CRESTOR) 20 MG tablet Take 1 tablet (20 mg total) by mouth daily. 90 tablet 3  . sertraline (ZOLOFT) 25 MG tablet Take 25 mg by mouth 2 (two) times daily as needed (anxiety).     Marland Kitchen spironolactone (ALDACTONE) 25 MG tablet TAKE 1 TABLET BY MOUTH EVERY DAY 30 tablet 2   . topiramate (TOPAMAX) 25 MG tablet Take 25 mg by mouth 2 (two) times daily as needed (headaches).      No current facility-administered medications on file prior to visit.    PAST MEDICAL HISTORY: Past Medical History:  Diagnosis Date  . Anxiety   . Cancer (HCC)    cervical, uterine,   . Distal radius fracture, right 02/22/2013  . History of cervical cancer   . Laceration of forehead 02/22/2013   sutures in place  . Seasonal allergies   . 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 ABDOMINAL HYSTERECTOMY     partial  . CESAREAN SECTION  1997  . CHOLECYSTECTOMY    . CORONARY STENT INTERVENTION N/A 03/14/2020   Procedure: CORONARY STENT INTERVENTION;  Surgeon: Adrian Prows, MD;  Location: [facility];  Service: Cardiovascular;  Laterality: N/A;  . KNEE ARTHROSCOPY Right 09/2014  . LAPAROSCOPIC LYSIS OF ADHESIONS  01/31/2006  . LEFT HEART CATH AND CORONARY ANGIOGRAPHY N/A 03/14/2020   Procedure: LEFT HEART CATH AND CORONARY ANGIOGRAPHY;  Surgeon: Adrian Prows, MD;  Location: [facility];  Service: Cardiovascular;  Laterality: N/A;  . OPEN REDUCTION INTERNAL FIXATION (ORIF) DISTAL RADIAL FRACTURE Right 02/24/2013   Procedure: OPEN REDUCTION INTERNAL FIXATION (ORIF) RIGHT DISTAL RADIUS FRACTURE;  Surgeon: Jolyn Nap, MD;  Location: [facility];  Service: Orthopedics;  Laterality: Right;    FAMILY HISTORY: The patient's family history includes Cervical cancer in her mother; Diabetes in her mother and sister; High blood pressure in her mother and sister.   SOCIAL HISTORY:  The patient  reports that she has been smoking cigarettes. She has a 24.00 pack-year smoking history. She has never used smokeless tobacco. She reports current alcohol use. She reports that she does not use drugs.  Review of Systems  Constitutional: Negative for chills, fever and  malaise/fatigue.  HENT: Negative for hoarse voice and nosebleeds.   Eyes: Negative for discharge, double vision and pain.  Cardiovascular: Negative for chest pain, claudication, dyspnea on exertion, leg swelling, near-syncope, orthopnea, palpitations, paroxysmal nocturnal dyspnea and syncope.  Respiratory: Positive for snoring. Negative for hemoptysis and shortness of breath.   Musculoskeletal: Negative for muscle cramps and myalgias.  Gastrointestinal: Negative for abdominal pain, constipation, diarrhea, hematemesis, hematochezia, melena, nausea and vomiting.  Neurological: Negative for dizziness and light-headedness.    PHYSICAL EXAM: Vitals with BMI 06/07/2020 05/29/2020 04/05/2020  Height 5\' 1"  5\' 1"  5\' 1"   Weight 198 lbs 198 lbs 196 lbs 3 oz  BMI 37.43 13.24 40.10  Systolic 272 536 644  Diastolic 79 82 90  Pulse 71 73 83  Some encounter information is confidential and restricted. Go to Review Flowsheets activity to see all data.    Constitutional: She isoriented to person, place, and time.Vital signs are normal. She appearswell-developedand well-nourished.  HENT:  Head:Normocephalicand atraumatic.  Eyes:Conjunctivaeare normal.  Cardiovascular:Normal rate,regular rhythm,normal heart soundsand intact distal pulses.  Pulmonary/Chest:Effort normaland breath sounds normal. Noaccessory muscle usage. Norespiratory distress.  Abdominal:Soft.Bowel sounds are normal.  Neurological: She isalertand oriented to person, place, and time.  Skin: Skin iswarmand dry.  Psychiatric: She has anormal mood and affect.  RADIOLOGY: Coronary CTA 03/02/2019: Non-cardiac:  Normal. Pulmonary veins drain normally to the left atrium. Calcium Score: 35 Agatston units. Coronary Arteries: Right dominant with no anomalies  LM: No plaque or stenosis. LAD system: Mixed plaque proximal LAD, mild (<50%) stenosis. Circumflex system: Calcified plaque proximal LCx, no significant  stenosis. RCA system: No plaque or stenosis.  IMPRESSION: 1. Coronary artery calcium score 35 Agatston units. This places the patient in the 98th percentile for age and gender, suggesting high risk for future cardiac events. 2. Mild stenosis in the proximal LAD.  CARDIAC DATABASE: EKG: 06/07/2020: Sinus  Rhythm, 72bpm, normal axis, no ischemia or injury pattern.  Echocardiogram: 02/12/2019: LVEF 55%. Left ventricle cavity is normal in size. Mild concentric hypertrophy of the left ventricle. Grade II diastolic dysfunction, elevated LAP. Calculated EF 55%. Trace AR. Mild TR, RVSP 25mmHG,   Stress Testing:  Lexiscan/Modified BruceSestamibi stress test 01/10/2020: Stress LVEF 62%. Normal myocardial perfusion. TID ratio 1.31. In absence of regional myocardial perfusion defects, this is a nonspecific finding. Clinical correaltion. Low risk study.   Heart Catheterization: Left Heart Catheterization 03/14/20:  LV: Normal LV systolic function.  Normal LVEDP.  No wall motion abnormality. RCA: Dominant.  Normal. Left main: Normal. Circumflex: Moderate to large vessel, minimal luminal irregularity. LAD: Acute angle takeoff, focal tandem 90% and a 80% stenosis in proximal LAD with large D1 in between.  Mid to distal LAD has mild disease. IVUS: High-grade stenosis in the proximal LAD constituting 2.80mm area at the site of stenosis.  Total lesion length measured 24 mm.  There was diffuse disease in the mid to distal LAD. Intervention: Successful IVUS guided PTCA and stenting of the proximal and mid LAD with implantation of a 3.0 x 26 mm resolute Onyx DES, deployed at 12 atmospheric pressure for 45 seconds, proximal segment postdilated with a 3.5 x 12 mm noncompliant sapphire.  Post intervention angiography revealed at least a 60% stenosis distal to the proximal stent in the mid segment of the LAD.  SP 2.5 x 18 mm resolute Onyx DES deployed at 12 atmospheric pressure, same stent balloon utilized to pull  proximally into the stent struts overlapping previous stent and a 16 atmospheric pressure inflation x60 seconds was performed.  Overall stenosis reduced to 0% with maintenance of TIMI-3 to TIMI-3 flow. Recommendation: Patient will need dual antiplatelet therapy for at least a period of 6 months followed by aspirin alone.  Smoking cessation discussed.  Carotid duplex: None  Vascular imaging: Lower Extremity Arterial Duplex 01/12/2020: No hemodynamically significant stenoses are identified in the lower extremity arterial system.  There is mild diffuse disease noted in the below knee vessels with monophasic waveform at the left ankle in AT vessel.  Non compressible vessels due to medial sclerosis.  Consider evaluation for pseudoclaudication.  Holter Monitor for 48 hours  02/01/2019: No symptoms reported.  There are occasional PACs.  Uneventful Holter monitor.  LABORATORY DATA: CBC Latest Ref Rng & Units 05/27/2020 03/27/2020 03/10/2020  WBC 4.0 - 10.5 K/uL 10.8(H) 11.2(H) 11.4(H)  Hemoglobin 12.0 - 15.0 g/dL 14.4 13.6 15.3  Hematocrit 36 - 46 % 43.0 40.0 46.8(H)  Platelets 150 - 400 K/uL 387 408(H) 395  CMP Latest Ref Rng & Units 05/27/2020 03/27/2020 03/06/2020  Glucose 70 - 99 mg/dL 95 144(H) 135(H)  BUN 6 - 20 mg/dL 16 20 18   Creatinine 0.44 - 1.00 mg/dL 1.00 1.10(H) 0.87  Sodium 135 - 145 mmol/L 140 138 144  Potassium 3.5 - 5.1 mmol/L 3.8 3.4(L) 3.8  Chloride 98 - 111 mmol/L 106 106 106  CO2 22 - 32 mmol/L 22 21(L) 22  Calcium 8.9 - 10.3 mg/dL 9.2 9.1 9.4  Total Protein 6.5 - 8.1 g/dL - - -  Total Bilirubin 0.3 - 1.2 mg/dL - - -  Alkaline Phos 38 - 126 U/L - - -  AST 15 - 41 U/L - - -  ALT 14 - 54 U/L - - -    Lipid Panel  No results found for: CHOL, TRIG, HDL, CHOLHDL, VLDL, LDLCALC, LDLDIRECT, LABVLDL  No results found for: HGBA1C No components found for: NTPROBNP No results found for: TSH  Cardiac Panel (last 3 results) No results for input(s): CKTOTAL, CKMB,  TROPONINIHS, RELINDX in the last 72 hours.  IMPRESSION:    ICD-10-CM   1. Atypical chest pain  R07.89   2. Coronary artery disease involving native coronary artery of native heart with other form of angina pectoris ([place])  I25.118 EKG 12-Lead  3. Status post coronary artery stent placement  Z95.5   4. Tobacco abuse  Z72.0   5. Mixed hyperlipidemia  E78.2   6. OSA (obstructive sleep apnea)  G47.33      RECOMMENDATIONS: Mary Benson is a 49 y.o. female whose past medical history and cardiovascular risk factors include: migraine with aura, hypercholesterolemia, obesity, mild obstructive sleep apnea, prior history of stroke in 2018, active tobacco use, and nonobstructive coronary artery disease per coronary CTA.   Atypical chest pain: No recurrence after uptitrating Imdur.  Continue to monitor.  EKG today shows normal sinus rhythm without underlying ischemia or injury pattern.  Atherosclerosis of the native coronary artery without angina pectoris status post angioplasty and stent:   Patient underwent left heart catheterization with intervention with 2 DES to the proximal/mid LAD  Dual antiplatelet therapy for 6 months since her last left heart catheterization in June 2021.  Continue Lasix  As needed.  Continue Aldactone.  Continue Toprol-XL 50 mg p.o. daily.  Continue rosuvastatin.  Continue Imdur 60 mg p.o. daily  Educated on the importance of complete smoking cessation.  Patient is encouraged to increase physical activity as tolerated with a goal of 30 minutes a day 5 days a week.  Patient will be starting cardiac rehab on June 22, 2020.  I would like to see the patient in close follow-up in 6 weeks to reevaluate symptoms.  Status post angioplasty and stenting: See above  Coronary artery calcification: Continue aspirin and statin therapy.  See above  Mixed hyperlipidemia: Continue statin therapy.  Does not endorse myalgias.  Tobacco use disorder: Educated on  importance of complete smoking cessation.    FINAL MEDICATION LIST END OF ENCOUNTER: No orders of the defined types were placed in this encounter.    Current Outpatient Medications:  .  aspirin EC 81 MG tablet, Take 1 tablet (81 mg total) by mouth daily., Disp: 150 tablet, Rfl: 2 .  cetirizine (ZYRTEC) 10 MG tablet, Take 10 mg by mouth daily as needed for allergies. , Disp: , Rfl:  .  clopidogrel (PLAVIX) 75 MG tablet, Take 1 tablet (75 mg total) by mouth daily., Disp: 90 tablet, Rfl: 1 .  ezetimibe (ZETIA)  10 MG tablet, Take 1 tablet (10 mg total) by mouth daily after supper., Disp: 30 tablet, Rfl: 2 .  furosemide (LASIX) 20 MG tablet, Take 1 tablet (20 mg total) by mouth 2 (two) times daily as needed (Dyspnea)., Disp: 180 tablet, Rfl: 0 .  isosorbide mononitrate (IMDUR) 60 MG 24 hr tablet, Take 1 tablet (60 mg total) by mouth daily., Disp: 90 tablet, Rfl: 1 .  metoprolol succinate (TOPROL-XL) 50 MG 24 hr tablet, Take 1 tablet (50 mg total) by mouth in the morning. Take with or immediately following a meal., Disp: 90 tablet, Rfl: 1 .  pantoprazole (PROTONIX) 40 MG tablet, TAKE 1 TABLET BY MOUTH EVERY DAY (Patient taking differently: Take 40 mg by mouth daily. ), Disp: 90 tablet, Rfl: 0 .  rosuvastatin (CRESTOR) 20 MG tablet, Take 1 tablet (20 mg total) by mouth daily., Disp: 90 tablet, Rfl: 3 .  sertraline (ZOLOFT) 25 MG tablet, Take 25 mg by mouth 2 (two) times daily as needed (anxiety). , Disp: , Rfl:  .  spironolactone (ALDACTONE) 25 MG tablet, TAKE 1 TABLET BY MOUTH EVERY DAY, Disp: 30 tablet, Rfl: 2 .  topiramate (TOPAMAX) 25 MG tablet, Take 25 mg by mouth 2 (two) times daily as needed (headaches). , Disp: , Rfl:   Orders Placed This Encounter  Procedures  . EKG 12-Lead   --Continue cardiac medications as reconciled in final medication list. --Return in about 6 weeks (around 07/19/2020) for Reevaluation of CAD. . Or sooner if needed. --Continue follow-up with your primary care  physician regarding the management of your other chronic comorbid conditions.  Patient's questions and concerns were addressed to her satisfaction. She voices understanding of the instructions provided during this encounter.   This note was created using a voice recognition software as a result there may be grammatical errors inadvertently enclosed that do not reflect the nature of this encounter. Every attempt is made to correct such errors.  Total time spent: 30 minutes.  Rex Kras, [place], [facility]  Pager: [phone] Office: [phone]

## 2020-06-12 ENCOUNTER — Telehealth (HOSPITAL_COMMUNITY): Payer: Self-pay

## 2020-06-19 ENCOUNTER — Encounter (HOSPITAL_COMMUNITY)
Admission: RE | Admit: 2020-06-19 | Discharge: 2020-06-19 | Disposition: A | Discharge status: Home / Self Care | Payer: No Typology Code available for payment source | Source: Ambulatory Visit | Attending: Cardiology | Admitting: Cardiology

## 2020-06-19 ENCOUNTER — Other Ambulatory Visit: Payer: Self-pay

## 2020-06-19 DIAGNOSIS — Z955 Presence of coronary angioplasty implant and graft: Secondary | ICD-10-CM | POA: Insufficient documentation

## 2020-06-19 NOTE — Telephone Encounter (Signed)
Cardiac Rehab Medication Review by a Pharmacist  Does the patient  feel that his/her medications are working for him/her?  yes  Has the patient been experiencing any side effects to the medications prescribed?  no  Does the patient measure his/her own blood pressure or blood glucose at home?  Rarely checks her blood pressure at home but does have a cuff.    Does the patient have any problems obtaining medications due to transportation or finances?   no  Understanding of regimen: fair Understanding of indications: good Potential of compliance: good  Pharmacist Comments: She has not had to use either Zoloft or Topamax in the last few months. She states the changes in her medications lately can be difficult to remember everything but was appreciative of the medication review today.    Claudina Lick, PharmD PGY1 Acute Care Pharmacy Resident  06/19/2020 4:00 PM  Please check [email or web address] for unit-specific pharmacy phone numbers.

## 2020-06-19 NOTE — Progress Notes (Signed)
Cardiac Rehab Telephone Note:  Successful telephone encounter to [place] to confirm Cardiac Rehab orientation appointment for 06/22/20 at 2:00 pm. Nursing assessment completed. Patient questions answered. Instructions for appointment provided. Patient screening for Covid-19 negative.  Yesennia Hirota E. Rollene Rotunda RN, BSN [place]. [facility]  Cardiac and Pulmonary Rehabilitation Phone: [phone] Fax: [phone]

## 2020-06-22 ENCOUNTER — Encounter (HOSPITAL_COMMUNITY)
Admission: RE | Admit: 2020-06-22 | Discharge: 2020-06-22 | Disposition: A | Discharge status: Home / Self Care | Payer: No Typology Code available for payment source | Source: Ambulatory Visit | Attending: Cardiology | Admitting: Cardiology

## 2020-06-22 ENCOUNTER — Encounter (HOSPITAL_COMMUNITY): Payer: Self-pay

## 2020-06-22 ENCOUNTER — Other Ambulatory Visit: Payer: Self-pay

## 2020-06-22 VITALS — BP 114/78 | HR 94 | Ht 61.5 in | Wt 198.9 lb

## 2020-06-22 DIAGNOSIS — Z955 Presence of coronary angioplasty implant and graft: Secondary | ICD-10-CM

## 2020-06-22 HISTORY — DX: Atherosclerotic heart disease of native coronary artery without angina pectoris: I25.10

## 2020-06-22 HISTORY — DX: Hyperlipidemia, unspecified: E78.5

## 2020-06-22 NOTE — Progress Notes (Signed)
Cardiac Individual Treatment Plan  Patient Details  Name: Mary Benson MRN: [id number] Date of Birth: 1971-09-03 Referring Provider:     [place] from 06/22/2020 in [place]  Referring Provider Rex Kras DO      Initial Encounter Date:    CARDIAC REHAB PHASE II ORIENTATION from 06/22/2020 in [place]  Date 06/22/20      Visit Diagnosis: 03/14/20 S/P DES LAD  Patient's Home Medications on Admission:  Current Outpatient Medications:  .  aspirin EC 81 MG tablet, Take 1 tablet (81 mg total) by mouth daily., Disp: 150 tablet, Rfl: 2 .  cetirizine (ZYRTEC) 10 MG tablet, Take 10 mg by mouth daily as needed for allergies. , Disp: , Rfl:  .  clopidogrel (PLAVIX) 75 MG tablet, Take 1 tablet (75 mg total) by mouth daily., Disp: 90 tablet, Rfl: 1 .  ezetimibe (ZETIA) 10 MG tablet, Take 1 tablet (10 mg total) by mouth daily after supper., Disp: 30 tablet, Rfl: 2 .  furosemide (LASIX) 20 MG tablet, Take 1 tablet (20 mg total) by mouth 2 (two) times daily as needed (Dyspnea)., Disp: 180 tablet, Rfl: 0 .  isosorbide mononitrate (IMDUR) 60 MG 24 hr tablet, Take 1 tablet (60 mg total) by mouth daily., Disp: 90 tablet, Rfl: 1 .  metoprolol succinate (TOPROL-XL) 50 MG 24 hr tablet, Take 1 tablet (50 mg total) by mouth in the morning. Take with or immediately following a meal., Disp: 90 tablet, Rfl: 1 .  pantoprazole (PROTONIX) 40 MG tablet, TAKE 1 TABLET BY MOUTH EVERY DAY (Patient taking differently: Take 40 mg by mouth daily. ), Disp: 90 tablet, Rfl: 0 .  rosuvastatin (CRESTOR) 20 MG tablet, Take 1 tablet (20 mg total) by mouth daily., Disp: 90 tablet, Rfl: 3 .  sertraline (ZOLOFT) 25 MG tablet, Take 25 mg by mouth 2 (two) times daily as needed (anxiety). , Disp: , Rfl:  .  spironolactone (ALDACTONE) 25 MG tablet, TAKE 1 TABLET BY MOUTH EVERY DAY, Disp: 30 tablet, Rfl: 2 .  topiramate (TOPAMAX) 25 MG  tablet, Take 25 mg by mouth 2 (two) times daily as needed (headaches). , Disp: , Rfl:   Past Medical History: Past Medical History:  Diagnosis Date  . Anxiety   . Cancer (HCC)    cervical, uterine,   . Coronary artery disease   . Distal radius fracture, right 02/22/2013  . History of cervical cancer   . Hyperlipidemia   . Laceration of forehead 02/22/2013   sutures in place  . Seasonal allergies   . 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 Packs/day: 1.00  . Years: 24.00  . Pack years: 24.00  . Types: Cigarettes  Smokeless Tobacco Never Used    Labs: Recent Review Flowsheet Data    Labs for ITP Cardiac and Pulmonary Rehab Latest Ref Rng & Units 02/22/2013 02/21/2017   TCO2 0 - 100 mmol/L 20 24      Capillary Blood Glucose: No results found for: GLUCAP   Exercise Target Goals: Exercise Program Goal: Individual exercise prescription set using results from initial 6 min walk test and THRR while considering  patient's activity barriers and safety.   Exercise Prescription Goal: Starting with aerobic activity 30 plus minutes a day, 3 days per week for initial  exercise prescription. Provide home exercise prescription and guidelines that participant acknowledges understanding prior to discharge.  Activity Barriers & Risk Stratification:  Activity Barriers & Cardiac Risk Stratification - 06/22/20 1524      Activity Barriers & Cardiac Risk Stratification   Activity Barriers None    Cardiac Risk Stratification Moderate           6 Minute Walk:  6 Minute Walk    Row Name 06/22/20 1523         6 Minute Walk   Phase Initial     Distance 1680 feet     Walk Time 6 minutes     # of Rest Breaks 0     MPH 3.2     METS 4.3     RPE 11     Perceived Dyspnea  0     VO2 Peak 14.9     Symptoms No     Resting HR 94 bpm     Resting BP 114/78     Resting  Oxygen Saturation  96 %     Exercise Oxygen Saturation  during 6 min walk 96 %     Max Ex. HR 113 bpm     Max Ex. BP 120/80     2 Minute Post BP 116/74            Oxygen Initial Assessment:   Oxygen Re-Evaluation:   Oxygen Discharge (Final Oxygen Re-Evaluation):   Initial Exercise Prescription:  Initial Exercise Prescription - 06/22/20 1500      Date of Initial Exercise RX and Referring Provider   Date 06/22/20    Referring Provider Rex Kras DO    Expected Discharge Date 08/18/20      Treadmill   MPH 2.3    Grade 1    Minutes 15    METs 3.08      NuStep   Level 2    SPM 75    Minutes 15    METs 2.5      Prescription Details   Frequency (times per week) 3x    Duration Progress to 10 minutes continuous walking  at current work load and total walking time to 30-45 min      Intensity   THRR 40-80% of Max Heartrate 70-139    Ratings of Perceived Exertion 11-13    Perceived Dyspnea 0-4      Progression   Progression Continue progressive overload as per policy without signs/symptoms or physical distress.      Resistance Training   Training Prescription Yes    Weight 3lbs    Reps 10-15           Perform Capillary Blood Glucose checks as needed.  Exercise Prescription Changes:   Exercise Comments:   Exercise Goals and Review:  Exercise Goals    Row Name 06/22/20 1524             Exercise Goals   Increase Physical Activity Yes       Intervention Provide advice, education, support and counseling about physical activity/exercise needs.;Develop an individualized exercise prescription for aerobic and resistive training based on initial evaluation findings, risk stratification, comorbidities and participant's personal goals.       Expected Outcomes Short Term: Attend rehab on a regular basis to increase amount of physical activity.;Long Term: Add in home exercise to make exercise part of routine and to increase amount of physical activity.;Long  Term: Exercising regularly at least 3-5 days a week.  Increase Strength and Stamina Yes       Intervention Provide advice, education, support and counseling about physical activity/exercise needs.;Develop an individualized exercise prescription for aerobic and resistive training based on initial evaluation findings, risk stratification, comorbidities and participant's personal goals.       Expected Outcomes Short Term: Increase workloads from initial exercise prescription for resistance, speed, and METs.;Short Term: Perform resistance training exercises routinely during rehab and add in resistance training at home;Long Term: Improve cardiorespiratory fitness, muscular endurance and strength as measured by increased METs and functional capacity (6MWT)       Able to understand and use rate of perceived exertion (RPE) scale Yes       Intervention Provide education and explanation on how to use RPE scale       Expected Outcomes Short Term: Able to use RPE daily in rehab to express subjective intensity level;Long Term:  Able to use RPE to guide intensity level when exercising independently       Knowledge and understanding of Target Heart Rate Range (THRR) Yes       Intervention Provide education and explanation of THRR including how the numbers were predicted and where they are located for reference       Expected Outcomes Short Term: Able to state/look up THRR;Long Term: Able to use THRR to govern intensity when exercising independently;Short Term: Able to use daily as guideline for intensity in rehab       Able to check pulse independently Yes       Intervention Provide education and demonstration on how to check pulse in carotid and radial arteries.;Review the importance of being able to check your own pulse for safety during independent exercise       Expected Outcomes Short Term: Able to explain why pulse checking is important during independent exercise;Long Term: Able to check pulse independently  and accurately       Understanding of Exercise Prescription Yes       Intervention Provide education, explanation, and written materials on patient's individual exercise prescription       Expected Outcomes Short Term: Able to explain program exercise prescription;Long Term: Able to explain home exercise prescription to exercise independently              Exercise Goals Re-Evaluation :    Discharge Exercise Prescription (Final Exercise Prescription Changes):   Nutrition:  Target Goals: Understanding of nutrition guidelines, daily intake of sodium 1500mg , cholesterol 200mg , calories 30% from fat and 7% or less from saturated fats, daily to have 5 or more servings of fruits and vegetables.  Biometrics:  Pre Biometrics - 06/22/20 1523      Pre Biometrics   Height 5' 1.5" (1.562 m)    Weight 90.2 kg    Waist Circumference 41.5 inches    Hip Circumference 45.5 inches    Waist to Hip Ratio 0.91 %    BMI (Calculated) 36.97    Triceps Skinfold 34 mm    % Body Fat 46.3 %    Grip Strength 30 kg    Flexibility 14.5 in    Single Leg Stand 30 seconds            Nutrition Therapy Plan and Nutrition Goals:   Nutrition Assessments:   Nutrition Goals Re-Evaluation:   Nutrition Goals Discharge (Final Nutrition Goals Re-Evaluation):   Psychosocial: Target Goals: Acknowledge presence or absence of significant depression and/or stress, maximize coping skills, provide positive support system. Participant is able to verbalize types and  ability to use techniques and skills needed for reducing stress and depression.  Initial Review & Psychosocial Screening:  Initial Psych Review & Screening - 06/22/20 1550      Initial Review   Current issues with Current Stress Concerns    Source of Stress Concerns Occupation      [place]? Yes      Barriers   Psychosocial barriers to participate in program The patient should benefit from training in stress  management and relaxation.      Screening Interventions   Interventions Encouraged to exercise;To provide support and resources with identified psychosocial needs;Provide feedback about the scores to participant    Expected Outcomes Long Term Goal: Stressors or current issues are controlled or eliminated.;Short Term goal: Identification and review with participant of any Quality of Life or Depression concerns found by scoring the questionnaire.;Long Term goal: The participant improves quality of Life and PHQ9 Scores as seen by post scores and/or verbalization of changes           Quality of Life Scores:  Quality of Life - 06/22/20 1524      Quality of Life   Select Quality of Life      Quality of Life Scores   Health/Function Pre 17.27 %    Socioeconomic Pre 27.86 %    Psych/Spiritual Pre 22.07 %    Family Pre 24.3 %    GLOBAL Pre 21.47 %          Scores of 19 and below usually indicate a poorer quality of life in these areas.  A difference of  2-3 points is a clinically meaningful difference.  A difference of 2-3 points in the total score of the Quality of Life Index has been associated with significant improvement in overall quality of life, self-image, physical symptoms, and general health in studies assessing change in quality of life.  PHQ-9: Recent Review Flowsheet Data    Depression screen [facility] 2/9 06/22/2020   Decreased Interest 0   Down, Depressed, Hopeless 0   PHQ - 2 Score 0     Interpretation of Total Score  Total Score Depression Severity:  1-4 = Minimal depression, 5-9 = Mild depression, 10-14 = Moderate depression, 15-19 = Moderately severe depression, 20-27 = Severe depression   Psychosocial Evaluation and Intervention:   Psychosocial Re-Evaluation:   Psychosocial Discharge (Final Psychosocial Re-Evaluation):   Vocational Rehabilitation: Provide vocational rehab assistance to qualifying candidates.   Vocational Rehab Evaluation & Intervention:   Vocational Rehab - 06/22/20 1551      Initial Vocational Rehab Evaluation & Intervention   Assessment shows need for Vocational Rehabilitation No   Beadie works full time and does not need vocational rehab at this time          Education: Education Goals: Education classes will be provided on a weekly basis, covering required topics. Participant will state understanding/return demonstration of topics presented.  Learning Barriers/Preferences:  Learning Barriers/Preferences - 06/22/20 1527      Learning Barriers/Preferences   Learning Barriers None    Learning Preferences Written Material;Skilled Demonstration           Education Topics: Hypertension, Hypertension Reduction -Define heart disease and high blood pressure. Discus how high blood pressure affects the body and ways to reduce high blood pressure.   Exercise and Your Heart -Discuss why it is important to exercise, the FITT principles of exercise, normal and abnormal responses to exercise, and how to exercise safely.  Angina -Discuss definition of angina, causes of angina, treatment of angina, and how to decrease risk of having angina.   Cardiac Medications -Review what the following cardiac medications are used for, how they affect the body, and side effects that may occur when taking the medications.  Medications include Aspirin, Beta blockers, calcium channel blockers, ACE Inhibitors, angiotensin receptor blockers, diuretics, digoxin, and antihyperlipidemics.   Congestive Heart Failure -Discuss the definition of CHF, how to live with CHF, the signs and symptoms of CHF, and how keep track of weight and sodium intake.   Heart Disease and Intimacy -Discus the effect sexual activity has on the heart, how changes occur during intimacy as we age, and safety during sexual activity.   Smoking Cessation / COPD -Discuss different methods to quit smoking, the health benefits of quitting smoking, and the definition of  COPD.   Nutrition I: Fats -Discuss the types of cholesterol, what cholesterol does to the heart, and how cholesterol levels can be controlled.   Nutrition II: Labels -Discuss the different components of food labels and how to read food label   Heart Parts/Heart Disease and PAD -Discuss the anatomy of the heart, the pathway of blood circulation through the heart, and these are affected by heart disease.   Stress I: Signs and Symptoms -Discuss the causes of stress, how stress may lead to anxiety and depression, and ways to limit stress.   Stress II: Relaxation -Discuss different types of relaxation techniques to limit stress.   Warning Signs of Stroke / TIA -Discuss definition of a stroke, what the signs and symptoms are of a stroke, and how to identify when someone is having stroke.   Knowledge Questionnaire Score:  Knowledge Questionnaire Score - 06/22/20 1524      Knowledge Questionnaire Score   Pre Score 24/28           Core Components/Risk Factors/Patient Goals at Admission:  Personal Goals and Risk Factors at Admission - 06/22/20 1525      Core Components/Risk Factors/Patient Goals on Admission    Weight Management Yes;Weight Loss    Intervention Weight Management: Develop a combined nutrition and exercise program designed to reach desired caloric intake, while maintaining appropriate intake of nutrient and fiber, sodium and fats, and appropriate energy expenditure required for the weight goal.;Weight Management: Provide education and appropriate resources to help participant work on and attain dietary goals.;Weight Management/Obesity: Establish reasonable short term and long term weight goals.;Obesity: Provide education and appropriate resources to help participant work on and attain dietary goals.    Admit Weight 198 lb 13.7 oz (90.2 kg)    Goal Weight: Long Term 150 lb (68 kg)    Expected Outcomes Short Term: Continue to assess and modify interventions until short  term weight is achieved;Long Term: Adherence to nutrition and physical activity/exercise program aimed toward attainment of established weight goal;Weight Loss: Understanding of general recommendations for a balanced deficit meal plan, which promotes 1-2 lb weight loss per week and includes a negative energy balance of [phone] kcal/d;Understanding recommendations for meals to include 15-35% energy as protein, 25-35% energy from fat, 35-60% energy from carbohydrates, less than 200mg  of dietary cholesterol, 20-35 gm of total fiber daily;Understanding of distribution of calorie intake throughout the day with the consumption of 4-5 meals/snacks    Tobacco Cessation Yes    Number of packs per day 2-3 ciggerattes a day    Intervention Assist the participant in steps to quit. Provide individualized education and counseling about committing to Tobacco  Cessation, relapse prevention, and pharmacological support that can be provided by physician.;Advice worker, assist with locating and accessing local/national Quit Smoking programs, and support quit date choice.    Expected Outcomes Short Term: Will demonstrate readiness to quit, by selecting a quit date.;Short Term: Will quit all tobacco product use, adhering to prevention of relapse plan.;Long Term: Complete abstinence from all tobacco products for at least 12 months from quit date.    Hypertension Yes    Intervention Provide education on lifestyle modifcations including regular physical activity/exercise, weight management, moderate sodium restriction and increased consumption of fresh fruit, vegetables, and low fat dairy, alcohol moderation, and smoking cessation.;Monitor prescription use compliance.    Expected Outcomes Short Term: Continued assessment and intervention until BP is < 140/54mm HG in hypertensive participants. < 130/54mm HG in hypertensive participants with diabetes, heart failure or chronic kidney disease.;Long Term: Maintenance of  blood pressure at goal levels.    Lipids Yes    Intervention Provide education and support for participant on nutrition & aerobic/resistive exercise along with prescribed medications to achieve LDL 70mg , HDL >40mg .    Expected Outcomes Short Term: Participant states understanding of desired cholesterol values and is compliant with medications prescribed. Participant is following exercise prescription and nutrition guidelines.;Long Term: Cholesterol controlled with medications as prescribed, with individualized exercise RX and with personalized nutrition plan. Value goals: LDL < 70mg , HDL > 40 mg.    Stress Yes    Intervention Offer individual and/or small group education and counseling on adjustment to heart disease, stress management and health-related lifestyle change. Teach and support self-help strategies.;Refer participants experiencing significant psychosocial distress to appropriate mental health specialists for further evaluation and treatment. When possible, include family members and significant others in education/counseling sessions.    Expected Outcomes Short Term: Participant demonstrates changes in health-related behavior, relaxation and other stress management skills, ability to obtain effective social support, and compliance with psychotropic medications if prescribed.;Long Term: Emotional wellbeing is indicated by absence of clinically significant psychosocial distress or social isolation.           Core Components/Risk Factors/Patient Goals Review:    Core Components/Risk Factors/Patient Goals at Discharge (Final Review):    ITP Comments:  ITP Comments    Row Name 06/22/20 1522           ITP Comments Dr. Fransico Him, Medical Director              Comments: [place]tum attended orientation on 06/23/2020 to review rules and guidelines for program.  Completed 6 minute walk test, Intitial ITP, and exercise prescription.  VSS. Telemetry-Sinus Rhythm.  Asymptomatic. Safety  measures and social distancing in place per CDC guidelines.Barnet Pall, RN,BSN 06/23/2020 8:23 AM

## 2020-06-26 ENCOUNTER — Ambulatory Visit (HOSPITAL_COMMUNITY): Payer: No Typology Code available for payment source

## 2020-06-26 ENCOUNTER — Encounter (HOSPITAL_COMMUNITY): Payer: No Typology Code available for payment source

## 2020-06-28 ENCOUNTER — Encounter (HOSPITAL_COMMUNITY): Payer: No Typology Code available for payment source

## 2020-06-29 ENCOUNTER — Encounter (HOSPITAL_COMMUNITY): Payer: Self-pay | Admitting: *Deleted

## 2020-06-29 ENCOUNTER — Telehealth (HOSPITAL_COMMUNITY): Payer: Self-pay | Admitting: *Deleted

## 2020-06-29 DIAGNOSIS — Z955 Presence of coronary angioplasty implant and graft: Secondary | ICD-10-CM

## 2020-06-29 NOTE — Telephone Encounter (Signed)
Spoke with the patient she had some coworker who have tested positive for COVID at her job. Mary Benson wears a mask at [facility] and has not been in contact with her coworkers with confirmed cases since last week. Mary Benson has no signs or symptoms of COVID 19 and plans to start exercise tomorrow.Barnet Pall, RN,BSN 06/29/2020 12:56 PM

## 2020-06-29 NOTE — Progress Notes (Signed)
Cardiac Individual Treatment Plan  Patient Details  Name: Mary Benson MRN: [id number] Date of Birth: 12/01/1970 Referring Provider:     [facility] from 06/22/2020 in [place]  Referring Provider Rex Kras DO      Initial Encounter Date:    CARDIAC REHAB PHASE II ORIENTATION from 06/22/2020 in [place]  Date 06/22/20      Visit Diagnosis: 03/14/20 S/P DES LAD  Patient's Home Medications on Admission:  Current Outpatient Medications:  .  aspirin EC 81 MG tablet, Take 1 tablet (81 mg total) by mouth daily., Disp: 150 tablet, Rfl: 2 .  cetirizine (ZYRTEC) 10 MG tablet, Take 10 mg by mouth daily as needed for allergies. , Disp: , Rfl:  .  clopidogrel (PLAVIX) 75 MG tablet, Take 1 tablet (75 mg total) by mouth daily., Disp: 90 tablet, Rfl: 1 .  ezetimibe (ZETIA) 10 MG tablet, Take 1 tablet (10 mg total) by mouth daily after supper., Disp: 30 tablet, Rfl: 2 .  furosemide (LASIX) 20 MG tablet, Take 1 tablet (20 mg total) by mouth 2 (two) times daily as needed (Dyspnea)., Disp: 180 tablet, Rfl: 0 .  isosorbide mononitrate (IMDUR) 60 MG 24 hr tablet, Take 1 tablet (60 mg total) by mouth daily., Disp: 90 tablet, Rfl: 1 .  metoprolol succinate (TOPROL-XL) 50 MG 24 hr tablet, Take 1 tablet (50 mg total) by mouth in the morning. Take with or immediately following a meal., Disp: 90 tablet, Rfl: 1 .  pantoprazole (PROTONIX) 40 MG tablet, TAKE 1 TABLET BY MOUTH EVERY DAY (Patient taking differently: Take 40 mg by mouth daily. ), Disp: 90 tablet, Rfl: 0 .  rosuvastatin (CRESTOR) 20 MG tablet, Take 1 tablet (20 mg total) by mouth daily., Disp: 90 tablet, Rfl: 3 .  sertraline (ZOLOFT) 25 MG tablet, Take 25 mg by mouth 2 (two) times daily as needed (anxiety). , Disp: , Rfl:  .  spironolactone (ALDACTONE) 25 MG tablet, TAKE 1 TABLET BY MOUTH EVERY DAY, Disp: 30 tablet, Rfl: 2 .  topiramate (TOPAMAX) 25 MG  tablet, Take 25 mg by mouth 2 (two) times daily as needed (headaches). , Disp: , Rfl:   Past Medical History: Past Medical History:  Diagnosis Date  . Anxiety   . Cancer (HCC)    cervical, uterine,   . Coronary artery disease   . Distal radius fracture, right 02/22/2013  . History of cervical cancer   . Hyperlipidemia   . Laceration of forehead 02/22/2013   sutures in place  . Seasonal allergies   . 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 Packs/day: 1.00  . Years: 24.00  . Pack years: 24.00  . Types: Cigarettes  Smokeless Tobacco Never Used    Labs: Recent Review Flowsheet Data    Labs for ITP Cardiac and Pulmonary Rehab Latest Ref Rng & Units 02/22/2013 02/21/2017   TCO2 0 - 100 mmol/L 20 24      Capillary Blood Glucose: No results found for: GLUCAP   Exercise Target Goals: Exercise Program Goal: Individual exercise prescription set using results from initial 6 min walk test and THRR while considering  patient's activity barriers and safety.   Exercise Prescription Goal: Starting with aerobic activity 30 plus minutes a day, 3 days per week for initial  exercise prescription. Provide home exercise prescription and guidelines that participant acknowledges understanding prior to discharge.  Activity Barriers & Risk Stratification:  Activity Barriers & Cardiac Risk Stratification - 06/22/20 1524      Activity Barriers & Cardiac Risk Stratification   Activity Barriers None    Cardiac Risk Stratification Moderate           6 Minute Walk:  6 Minute Walk    Row Name 06/22/20 1523         6 Minute Walk   Phase Initial     Distance 1680 feet     Walk Time 6 minutes     # of Rest Breaks 0     MPH 3.2     METS 4.3     RPE 11     Perceived Dyspnea  0     VO2 Peak 14.9     Symptoms No     Resting HR 94 bpm     Resting BP 114/78     Resting  Oxygen Saturation  96 %     Exercise Oxygen Saturation  during 6 min walk 96 %     Max Ex. HR 113 bpm     Max Ex. BP 120/80     2 Minute Post BP 116/74            Oxygen Initial Assessment:   Oxygen Re-Evaluation:   Oxygen Discharge (Final Oxygen Re-Evaluation):   Initial Exercise Prescription:  Initial Exercise Prescription - 06/22/20 1500      Date of Initial Exercise RX and Referring Provider   Date 06/22/20    Referring Provider Rex Kras DO    Expected Discharge Date 08/18/20      Treadmill   MPH 2.3    Grade 1    Minutes 15    METs 3.08      NuStep   Level 2    SPM 75    Minutes 15    METs 2.5      Prescription Details   Frequency (times per week) 3x    Duration Progress to 10 minutes continuous walking  at current work load and total walking time to 30-45 min      Intensity   THRR 40-80% of Max Heartrate 70-139    Ratings of Perceived Exertion 11-13    Perceived Dyspnea 0-4      Progression   Progression Continue progressive overload as per policy without signs/symptoms or physical distress.      Resistance Training   Training Prescription Yes    Weight 3lbs    Reps 10-15           Perform Capillary Blood Glucose checks as needed.  Exercise Prescription Changes:   Exercise Comments:   Exercise Goals and Review:  Exercise Goals    Row Name 06/22/20 1524             Exercise Goals   Increase Physical Activity Yes       Intervention Provide advice, education, support and counseling about physical activity/exercise needs.;Develop an individualized exercise prescription for aerobic and resistive training based on initial evaluation findings, risk stratification, comorbidities and participant's personal goals.       Expected Outcomes Short Term: Attend rehab on a regular basis to increase amount of physical activity.;Long Term: Add in home exercise to make exercise part of routine and to increase amount of physical activity.;Long  Term: Exercising regularly at least 3-5 days a week.  Increase Strength and Stamina Yes       Intervention Provide advice, education, support and counseling about physical activity/exercise needs.;Develop an individualized exercise prescription for aerobic and resistive training based on initial evaluation findings, risk stratification, comorbidities and participant's personal goals.       Expected Outcomes Short Term: Increase workloads from initial exercise prescription for resistance, speed, and METs.;Short Term: Perform resistance training exercises routinely during rehab and add in resistance training at home;Long Term: Improve cardiorespiratory fitness, muscular endurance and strength as measured by increased METs and functional capacity (6MWT)       Able to understand and use rate of perceived exertion (RPE) scale Yes       Intervention Provide education and explanation on how to use RPE scale       Expected Outcomes Short Term: Able to use RPE daily in rehab to express subjective intensity level;Long Term:  Able to use RPE to guide intensity level when exercising independently       Knowledge and understanding of Target Heart Rate Range (THRR) Yes       Intervention Provide education and explanation of THRR including how the numbers were predicted and where they are located for reference       Expected Outcomes Short Term: Able to state/look up THRR;Long Term: Able to use THRR to govern intensity when exercising independently;Short Term: Able to use daily as guideline for intensity in rehab       Able to check pulse independently Yes       Intervention Provide education and demonstration on how to check pulse in carotid and radial arteries.;Review the importance of being able to check your own pulse for safety during independent exercise       Expected Outcomes Short Term: Able to explain why pulse checking is important during independent exercise;Long Term: Able to check pulse independently  and accurately       Understanding of Exercise Prescription Yes       Intervention Provide education, explanation, and written materials on patient's individual exercise prescription       Expected Outcomes Short Term: Able to explain program exercise prescription;Long Term: Able to explain home exercise prescription to exercise independently              Exercise Goals Re-Evaluation :    Discharge Exercise Prescription (Final Exercise Prescription Changes):   Nutrition:  Target Goals: Understanding of nutrition guidelines, daily intake of sodium 1500mg , cholesterol 200mg , calories 30% from fat and 7% or less from saturated fats, daily to have 5 or more servings of fruits and vegetables.  Biometrics:  Pre Biometrics - 06/22/20 1523      Pre Biometrics   Height 5' 1.5" (1.562 m)    Weight 198 lb 13.7 oz (90.2 kg)    Waist Circumference 41.5 inches    Hip Circumference 45.5 inches    Waist to Hip Ratio 0.91 %    BMI (Calculated) 36.97    Triceps Skinfold 34 mm    % Body Fat 46.3 %    Grip Strength 30 kg    Flexibility 14.5 in    Single Leg Stand 30 seconds            Nutrition Therapy Plan and Nutrition Goals:   Nutrition Assessments:   Nutrition Goals Re-Evaluation:   Nutrition Goals Discharge (Final Nutrition Goals Re-Evaluation):   Psychosocial: Target Goals: Acknowledge presence or absence of significant depression and/or stress, maximize coping skills, provide positive support system. Participant is able  to verbalize types and ability to use techniques and skills needed for reducing stress and depression.  Initial Review & Psychosocial Screening:  Initial Psych Review & Screening - 06/22/20 1550      Initial Review   Current issues with Current Stress Concerns    Source of Stress Concerns Occupation      [place]? Yes      Barriers   Psychosocial barriers to participate in program The patient should benefit from  training in stress management and relaxation.      Screening Interventions   Interventions Encouraged to exercise;To provide support and resources with identified psychosocial needs;Provide feedback about the scores to participant    Expected Outcomes Long Term Goal: Stressors or current issues are controlled or eliminated.;Short Term goal: Identification and review with participant of any Quality of Life or Depression concerns found by scoring the questionnaire.;Long Term goal: The participant improves quality of Life and PHQ9 Scores as seen by post scores and/or verbalization of changes           Quality of Life Scores:  Quality of Life - 06/22/20 1524      Quality of Life   Select Quality of Life      Quality of Life Scores   Health/Function Pre 17.27 %    Socioeconomic Pre 27.86 %    Psych/Spiritual Pre 22.07 %    Family Pre 24.3 %    GLOBAL Pre 21.47 %          Scores of 19 and below usually indicate a poorer quality of life in these areas.  A difference of  2-3 points is a clinically meaningful difference.  A difference of 2-3 points in the total score of the Quality of Life Index has been associated with significant improvement in overall quality of life, self-image, physical symptoms, and general health in studies assessing change in quality of life.  PHQ-9: Recent Review Flowsheet Data    Depression screen [facility] 2/9 06/22/2020   Decreased Interest 0   Down, Depressed, Hopeless 0   PHQ - 2 Score 0     Interpretation of Total Score  Total Score Depression Severity:  1-4 = Minimal depression, 5-9 = Mild depression, 10-14 = Moderate depression, 15-19 = Moderately severe depression, 20-27 = Severe depression   Psychosocial Evaluation and Intervention:   Psychosocial Re-Evaluation:   Psychosocial Discharge (Final Psychosocial Re-Evaluation):   Vocational Rehabilitation: Provide vocational rehab assistance to qualifying candidates.   Vocational Rehab Evaluation &  Intervention:  Vocational Rehab - 06/22/20 1551      Initial Vocational Rehab Evaluation & Intervention   Assessment shows need for Vocational Rehabilitation No   Annalycia works full time and does not need vocational rehab at this time          Education: Education Goals: Education classes will be provided on a weekly basis, covering required topics. Participant will state understanding/return demonstration of topics presented.  Learning Barriers/Preferences:  Learning Barriers/Preferences - 06/22/20 1527      Learning Barriers/Preferences   Learning Barriers None    Learning Preferences Written Material;Skilled Demonstration           Education Topics: Hypertension, Hypertension Reduction -Define heart disease and high blood pressure. Discus how high blood pressure affects the body and ways to reduce high blood pressure.   Exercise and Your Heart -Discuss why it is important to exercise, the FITT principles of exercise, normal and abnormal responses to exercise, and how to  exercise safely.   Angina -Discuss definition of angina, causes of angina, treatment of angina, and how to decrease risk of having angina.   Cardiac Medications -Review what the following cardiac medications are used for, how they affect the body, and side effects that may occur when taking the medications.  Medications include Aspirin, Beta blockers, calcium channel blockers, ACE Inhibitors, angiotensin receptor blockers, diuretics, digoxin, and antihyperlipidemics.   Congestive Heart Failure -Discuss the definition of CHF, how to live with CHF, the signs and symptoms of CHF, and how keep track of weight and sodium intake.   Heart Disease and Intimacy -Discus the effect sexual activity has on the heart, how changes occur during intimacy as we age, and safety during sexual activity.   Smoking Cessation / COPD -Discuss different methods to quit smoking, the health benefits of quitting smoking, and  the definition of COPD.   Nutrition I: Fats -Discuss the types of cholesterol, what cholesterol does to the heart, and how cholesterol levels can be controlled.   Nutrition II: Labels -Discuss the different components of food labels and how to read food label   Heart Parts/Heart Disease and PAD -Discuss the anatomy of the heart, the pathway of blood circulation through the heart, and these are affected by heart disease.   Stress I: Signs and Symptoms -Discuss the causes of stress, how stress may lead to anxiety and depression, and ways to limit stress.   Stress II: Relaxation -Discuss different types of relaxation techniques to limit stress.   Warning Signs of Stroke / TIA -Discuss definition of a stroke, what the signs and symptoms are of a stroke, and how to identify when someone is having stroke.   Knowledge Questionnaire Score:  Knowledge Questionnaire Score - 06/22/20 1524      Knowledge Questionnaire Score   Pre Score 24/28           Core Components/Risk Factors/Patient Goals at Admission:  Personal Goals and Risk Factors at Admission - 06/22/20 1525      Core Components/Risk Factors/Patient Goals on Admission    Weight Management Yes;Weight Loss    Intervention Weight Management: Develop a combined nutrition and exercise program designed to reach desired caloric intake, while maintaining appropriate intake of nutrient and fiber, sodium and fats, and appropriate energy expenditure required for the weight goal.;Weight Management: Provide education and appropriate resources to help participant work on and attain dietary goals.;Weight Management/Obesity: Establish reasonable short term and long term weight goals.;Obesity: Provide education and appropriate resources to help participant work on and attain dietary goals.    Admit Weight 198 lb 13.7 oz (90.2 kg)    Goal Weight: Long Term 150 lb (68 kg)    Expected Outcomes Short Term: Continue to assess and modify  interventions until short term weight is achieved;Long Term: Adherence to nutrition and physical activity/exercise program aimed toward attainment of established weight goal;Weight Loss: Understanding of general recommendations for a balanced deficit meal plan, which promotes 1-2 lb weight loss per week and includes a negative energy balance of [phone] kcal/d;Understanding recommendations for meals to include 15-35% energy as protein, 25-35% energy from fat, 35-60% energy from carbohydrates, less than 200mg  of dietary cholesterol, 20-35 gm of total fiber daily;Understanding of distribution of calorie intake throughout the day with the consumption of 4-5 meals/snacks    Tobacco Cessation Yes    Number of packs per day 2-3 ciggerattes a day    Intervention Assist the participant in steps to quit. Provide individualized education and counseling  about committing to Tobacco Cessation, relapse prevention, and pharmacological support that can be provided by physician.;Advice worker, assist with locating and accessing local/national Quit Smoking programs, and support quit date choice.    Expected Outcomes Short Term: Will demonstrate readiness to quit, by selecting a quit date.;Short Term: Will quit all tobacco product use, adhering to prevention of relapse plan.;Long Term: Complete abstinence from all tobacco products for at least 12 months from quit date.    Hypertension Yes    Intervention Provide education on lifestyle modifcations including regular physical activity/exercise, weight management, moderate sodium restriction and increased consumption of fresh fruit, vegetables, and low fat dairy, alcohol moderation, and smoking cessation.;Monitor prescription use compliance.    Expected Outcomes Short Term: Continued assessment and intervention until BP is < 140/46mm HG in hypertensive participants. < 130/71mm HG in hypertensive participants with diabetes, heart failure or chronic kidney  disease.;Long Term: Maintenance of blood pressure at goal levels.    Lipids Yes    Intervention Provide education and support for participant on nutrition & aerobic/resistive exercise along with prescribed medications to achieve LDL 70mg , HDL >40mg .    Expected Outcomes Short Term: Participant states understanding of desired cholesterol values and is compliant with medications prescribed. Participant is following exercise prescription and nutrition guidelines.;Long Term: Cholesterol controlled with medications as prescribed, with individualized exercise RX and with personalized nutrition plan. Value goals: LDL < 70mg , HDL > 40 mg.    Stress Yes    Intervention Offer individual and/or small group education and counseling on adjustment to heart disease, stress management and health-related lifestyle change. Teach and support self-help strategies.;Refer participants experiencing significant psychosocial distress to appropriate mental health specialists for further evaluation and treatment. When possible, include family members and significant others in education/counseling sessions.    Expected Outcomes Short Term: Participant demonstrates changes in health-related behavior, relaxation and other stress management skills, ability to obtain effective social support, and compliance with psychotropic medications if prescribed.;Long Term: Emotional wellbeing is indicated by absence of clinically significant psychosocial distress or social isolation.           Core Components/Risk Factors/Patient Goals Review:    Core Components/Risk Factors/Patient Goals at Discharge (Final Review):    ITP Comments:  ITP Comments    Row Name 06/22/20 1522 06/29/20 1257         ITP Comments Dr. Fransico Him, Medical Director 30 Day ITP Review. Hooria is to start exercise at cardiac rehab tomorrow.             Comments: See ITP Comments.Barnet Pall, RN,BSN 06/29/2020 12:59 PM

## 2020-06-30 ENCOUNTER — Encounter (HOSPITAL_COMMUNITY)
Admission: RE | Admit: 2020-06-30 | Discharge: 2020-06-30 | Disposition: A | Discharge status: Home / Self Care | Payer: No Typology Code available for payment source | Source: Ambulatory Visit | Attending: Cardiology | Admitting: Cardiology

## 2020-06-30 ENCOUNTER — Other Ambulatory Visit: Payer: Self-pay

## 2020-06-30 DIAGNOSIS — Z955 Presence of coronary angioplasty implant and graft: Secondary | ICD-10-CM

## 2020-06-30 NOTE — Progress Notes (Signed)
Daily Session Note  Patient Details  Name: Mary Benson MRN: [id number] Date of Birth: Nov 26, 1970 Referring Provider:     CARDIAC REHAB PHASE II ORIENTATION from 06/22/2020 in [facility]  Referring Provider Tessa Lerner DO      Encounter Date: 06/30/2020  Check In:  Session Check In - 06/30/20 1512      Check-In   Supervising physician immediately available to respond to emergencies [facility] immediately available    Physician(s) Dr. Jacqulyn Bath    Location [facility]    Staff Present Gladstone Lighter, RN, Zachery Conch, MS, ACSM CEP, Exercise Physiologist;David Manus Gunning, MS, EP-C, CCRP;Portia Suzie Portela, RN, BSN    Virtual Visit No    Medication changes reported     No    Tobacco Cessation No Change    Warm-up and Cool-down Performed on first and last piece of equipment    Resistance Training Performed Yes    VAD Patient? No    PAD/SET Patient? No      Pain Assessment   Currently in Pain? No/denies    Multiple Pain Sites No           Capillary Blood Glucose: No results found for this or any previous visit (from the past 24 hour(s)).   Exercise Prescription Changes - 06/30/20 1600      Response to Exercise   Blood Pressure (Admit) 112/72    Blood Pressure (Exercise) 124/60    Blood Pressure (Exit) 104/70    Heart Rate (Admit) 88 bpm    Heart Rate (Exercise) 114 bpm    Heart Rate (Exit) 88 bpm    Rating of Perceived Exertion (Exercise) 13    Perceived Dyspnea (Exercise) 0    Symptoms None    Comments Pt's first day of exerciise in the CRP2 program.    Duration Progress to 30 minutes of  aerobic without signs/symptoms of physical distress    Intensity THRR unchanged      Progression   Progression Continue to progress workloads to maintain intensity without signs/symptoms of physical distress.    Average METs 2.85      Resistance Training   Training Prescription Yes    Weight 3lbs    Reps 10-15     Time 10 Minutes      Interval Training   Interval Training No      Treadmill   MPH 2.3    Grade 1    Minutes 15    METs 3.08      NuStep   Level 2    SPM 75    Minutes 15    METs 2.6           Social History   Tobacco Use  Smoking Status Light Tobacco Smoker  . Packs/day: 1.00  . Years: 24.00  . Pack years: 24.00  . Types: Cigarettes  Smokeless Tobacco Never Used    Goals Met:  Exercise tolerated well No report of cardiac concerns or symptoms Strength training completed today  Goals Unmet:  Not Applicable  Comments: Mary Benson started cardiac rehab today.  Pt tolerated light exercise without difficulty. VSS, telemetry-Sinus Rhythm, asymptomatic.  Medication list reconciled. Pt denies barriers to medicaiton compliance.  PSYCHOSOCIAL ASSESSMENT:  PHQ-0. Pt exhibits positive coping skills, hopeful outlook with supportive family. No psychosocial needs identified at this time, no psychosocial interventions necessary.    Pt enjoys dancing,roller skating, dancing, singing and listening to music .   Pt oriented to exercise equipment  and routine.    Understanding verbalized.Barnet Pall, RN,BSN 06/30/2020 4:26 PM  Dr. Fransico Him is Medical Director for Cardiac Rehab at [facility].

## 2020-07-03 ENCOUNTER — Encounter (HOSPITAL_COMMUNITY): Payer: No Typology Code available for payment source

## 2020-07-05 ENCOUNTER — Encounter (HOSPITAL_COMMUNITY): Payer: No Typology Code available for payment source

## 2020-07-06 ENCOUNTER — Ambulatory Visit: Payer: No Typology Code available for payment source | Admitting: Cardiology

## 2020-07-07 ENCOUNTER — Encounter (HOSPITAL_COMMUNITY): Payer: No Typology Code available for payment source

## 2020-07-10 ENCOUNTER — Encounter (HOSPITAL_COMMUNITY): Payer: No Typology Code available for payment source

## 2020-07-12 ENCOUNTER — Encounter (HOSPITAL_COMMUNITY): Payer: No Typology Code available for payment source

## 2020-07-14 ENCOUNTER — Encounter (HOSPITAL_COMMUNITY): Payer: No Typology Code available for payment source

## 2020-07-14 ENCOUNTER — Telehealth (HOSPITAL_COMMUNITY): Payer: Self-pay | Admitting: *Deleted

## 2020-07-17 ENCOUNTER — Encounter (HOSPITAL_COMMUNITY): Admission: RE | Admit: 2020-07-17 | Payer: No Typology Code available for payment source | Source: Ambulatory Visit

## 2020-07-17 ENCOUNTER — Other Ambulatory Visit: Payer: Self-pay

## 2020-07-19 ENCOUNTER — Encounter (HOSPITAL_COMMUNITY): Payer: No Typology Code available for payment source

## 2020-07-21 ENCOUNTER — Encounter (HOSPITAL_COMMUNITY): Payer: No Typology Code available for payment source

## 2020-07-21 ENCOUNTER — Encounter: Payer: Self-pay | Admitting: Cardiology

## 2020-07-21 ENCOUNTER — Other Ambulatory Visit: Payer: Self-pay

## 2020-07-21 ENCOUNTER — Ambulatory Visit: Payer: No Typology Code available for payment source | Admitting: Cardiology

## 2020-07-21 VITALS — BP 123/78 | HR 75 | Resp 16 | Ht 61.5 in | Wt 198.0 lb

## 2020-07-21 DIAGNOSIS — G4733 Obstructive sleep apnea (adult) (pediatric): Secondary | ICD-10-CM

## 2020-07-21 DIAGNOSIS — E782 Mixed hyperlipidemia: Secondary | ICD-10-CM

## 2020-07-21 DIAGNOSIS — Z720 Tobacco use: Secondary | ICD-10-CM

## 2020-07-21 DIAGNOSIS — I2510 Atherosclerotic heart disease of native coronary artery without angina pectoris: Secondary | ICD-10-CM

## 2020-07-21 DIAGNOSIS — Z8673 Personal history of transient ischemic attack (TIA), and cerebral infarction without residual deficits: Secondary | ICD-10-CM

## 2020-07-21 DIAGNOSIS — Z955 Presence of coronary angioplasty implant and graft: Secondary | ICD-10-CM

## 2020-07-21 NOTE — Progress Notes (Signed)
Mary Benson Date of Birth: 10-02-1970 MRN: [id number] Primary Care Provider:Spencer, Domingo Pulse, PA-C Former Cardiology Providers: Jeri Lager, APRN, FNP-C, Dr. Adrian Prows Primary Cardiologist: Rex Kras, DO, [facility] (established care 12/06/2019)  Date: 07/21/20 Last Office Visit: 06/07/2020  Chief Complaint  Patient presents with  . Coronary Artery Disease  . Follow-up    6 week  for CAD     HPI  Mary Benson is a 49 y.o.  female who presents to the office with a chief complaint of " reevaluation of heart disease." Patient's past medical history and cardiovascular risk factors include: migraine with aura, hypercholesterolemia, obesity, mild obstructive sleep apnea, prior history of stroke in 2018, premature coronary artery disease status post angioplasty/PCI to the LAD, active tobacco use.   Patient is noted to have nonobstructive coronary artery disease based on coronary CTA back in 2020.  Despite up titration of guideline directed medical therapy she continued to have symptoms suggestive of anginal equivalent.  She underwent left heart catheterization in June 2021 and was noted to have obstructive CAD in the proximal/mid LAD. She underwent angioplasty and stenting to the proximal/mid LAD.  She is now status post 2 drug-eluting stents to the LAD 3 x 26 mm resolute Onyx DES and a 2.5 x 18 mm resolute Onyx DES.    Since last office visit she has not had any chest pain or anginal equivalent.  She has not used any sublingual nitroglycerin tablets.  She is increased her physical activity and walking 1.5 miles 3 times a week.  She is completely eliminated soda consumption from her diet and does not eat past 6 PM.  Unfortunately, patient continues to smoke but less than 4 cigarettes/day.  She did start cardiac rehab since last visit.  ALLERGIES: Allergies  Allergen Reactions  . Hydrocodone Other (See Comments)    ABD. PAIN  . Penicillins Hives   MEDICATION LIST PRIOR TO  VISIT: Current Outpatient Medications on File Prior to Visit  Medication Sig Dispense Refill  . aspirin EC 81 MG tablet Take 1 tablet (81 mg total) by mouth daily. 150 tablet 2  . cetirizine (ZYRTEC) 10 MG tablet Take 10 mg by mouth daily as needed for allergies.     Marland Kitchen clopidogrel (PLAVIX) 75 MG tablet Take 1 tablet (75 mg total) by mouth daily. 90 tablet 1  . ezetimibe (ZETIA) 10 MG tablet Take 1 tablet (10 mg total) by mouth daily after supper. 30 tablet 2  . furosemide (LASIX) 20 MG tablet Take 1 tablet (20 mg total) by mouth 2 (two) times daily as needed (Dyspnea). 180 tablet 0  . isosorbide mononitrate (IMDUR) 60 MG 24 hr tablet Take 1 tablet (60 mg total) by mouth daily. 90 tablet 1  . metoprolol succinate (TOPROL-XL) 50 MG 24 hr tablet Take 1 tablet (50 mg total) by mouth in the morning. Take with or immediately following a meal. 90 tablet 1  . pantoprazole (PROTONIX) 40 MG tablet TAKE 1 TABLET BY MOUTH EVERY DAY (Patient taking differently: Take 40 mg by mouth daily. ) 90 tablet 0  . rosuvastatin (CRESTOR) 20 MG tablet Take 1 tablet (20 mg total) by mouth daily. 90 tablet 3  . sertraline (ZOLOFT) 25 MG tablet Take 25 mg by mouth 2 (two) times daily as needed (anxiety).     Marland Kitchen spironolactone (ALDACTONE) 25 MG tablet TAKE 1 TABLET BY MOUTH EVERY DAY 30 tablet 2  . topiramate (TOPAMAX) 25 MG tablet Take 25 mg by mouth 2 (  two) times daily as needed (headaches).      No current facility-administered medications on file prior to visit.    PAST MEDICAL HISTORY: Past Medical History:  Diagnosis Date  . Anxiety   . Cancer (HCC)    cervical, uterine,   . Coronary artery disease   . Distal radius fracture, right 02/22/2013  . History of cervical cancer   . Hyperlipidemia   . Laceration of forehead 02/22/2013   sutures in place  . Seasonal allergies   . 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 ABDOMINAL HYSTERECTOMY     partial  . CARDIAC CATHETERIZATION    . CESAREAN SECTION  1997  . CHOLECYSTECTOMY    . CORONARY STENT INTERVENTION N/A 03/14/2020   Procedure: CORONARY STENT INTERVENTION;  Surgeon: Adrian Prows, MD;  Location: [place] [facility];  Service: Cardiovascular;  Laterality: N/A;  . KNEE ARTHROSCOPY Right 09/2014  . LAPAROSCOPIC LYSIS OF ADHESIONS  01/31/2006  . LEFT HEART CATH AND CORONARY ANGIOGRAPHY N/A 03/14/2020   Procedure: LEFT HEART CATH AND CORONARY ANGIOGRAPHY;  Surgeon: Adrian Prows, MD;  Location: [place] [facility];  Service: Cardiovascular;  Laterality: N/A;  . OPEN REDUCTION INTERNAL FIXATION (ORIF) DISTAL RADIAL FRACTURE Right 02/24/2013   Procedure: OPEN REDUCTION INTERNAL FIXATION (ORIF) RIGHT DISTAL RADIUS FRACTURE;  Surgeon: Jolyn Nap, MD;  Location: [place];  Service: Orthopedics;  Laterality: Right;    FAMILY HISTORY: The patient's family history includes Cervical cancer in her mother; Diabetes in her mother and sister; High blood pressure in her mother and sister.   SOCIAL HISTORY:  The patient  reports that she has been smoking cigarettes. She has a 24.00 pack-year smoking history. She has never used smokeless tobacco. She reports current alcohol use. She reports that she does not use drugs.  Review of Systems  Constitutional: Negative for chills, fever and malaise/fatigue.  HENT: Negative for hoarse voice and nosebleeds.   Eyes: Negative for discharge, double vision and pain.  Cardiovascular: Negative for chest pain, claudication, dyspnea on exertion, leg swelling, near-syncope, orthopnea, palpitations, paroxysmal nocturnal dyspnea and syncope.  Respiratory: Positive for snoring. Negative for hemoptysis and shortness of breath.   Musculoskeletal: Negative for muscle cramps and myalgias.  Gastrointestinal: Negative for abdominal pain, constipation, diarrhea, hematemesis, hematochezia, melena, nausea and  vomiting.  Neurological: Negative for dizziness and light-headedness.    PHYSICAL EXAM: Vitals with BMI 07/21/2020 06/22/2020 06/07/2020  Height 5' 1.5" 5' 1.5" 5\' 1"   Weight 198 lbs 198 lbs 14 oz 198 lbs  BMI 36.81 13.08 65.78  Systolic 469 629 528  Diastolic 78 78 79  Pulse 75 94 71  Some encounter information is confidential and restricted. Go to Review Flowsheets activity to see all data.    Constitutional: She isoriented to person, place, and time.Vital signs are normal. She appearswell-developedand well-nourished.  HENT:  Head:Normocephalicand atraumatic.  Eyes:Conjunctivaeare normal.  Cardiovascular:Normal rate,regular rhythm,normal heart soundsand intact distal pulses.  Pulmonary/Chest:Effort normaland breath sounds normal. Noaccessory muscle usage. Norespiratory distress.  Abdominal:Soft.Bowel sounds are normal.  Neurological: She isalertand oriented to person, place, and time.  Skin: Skin iswarmand dry.  Psychiatric: She has anormal mood and affect.  RADIOLOGY: Coronary CTA 03/02/2019: Non-cardiac:  Normal. Pulmonary veins drain normally to the left atrium. Calcium Score: 35 Agatston units. Coronary Arteries: Right dominant with no anomalies  LM: No plaque or  stenosis. LAD system: Mixed plaque proximal LAD, mild (<50%) stenosis. Circumflex system: Calcified plaque proximal LCx, no significant stenosis. RCA system: No plaque or stenosis.  IMPRESSION: 1. Coronary artery calcium score 35 Agatston units. This places the patient in the 98th percentile for age and gender, suggesting high risk for future cardiac events. 2. Mild stenosis in the proximal LAD.  CARDIAC DATABASE: EKG: 06/07/2020: Sinus  Rhythm, 72bpm, normal axis, no ischemia or injury pattern.  Echocardiogram: 02/12/2019: LVEF 55%. Left ventricle cavity is normal in size. Mild concentric hypertrophy of the left ventricle. Grade II diastolic dysfunction, elevated LAP. Calculated EF  55%. Trace AR. Mild TR, RVSP 35mmHG,   Stress Testing:  Lexiscan/Modified BruceSestamibi stress test 01/10/2020: Stress LVEF 62%. Normal myocardial perfusion. TID ratio 1.31. In absence of regional myocardial perfusion defects, this is a nonspecific finding. Clinical correaltion. Low risk study.   Heart Catheterization: Left Heart Catheterization 03/14/20:  LV: Normal LV systolic function.  Normal LVEDP.  No wall motion abnormality. RCA: Dominant.  Normal. Left main: Normal. Circumflex: Moderate to large vessel, minimal luminal irregularity. LAD: Acute angle takeoff, focal tandem 90% and a 80% stenosis in proximal LAD with large D1 in between.  Mid to distal LAD has mild disease. IVUS: High-grade stenosis in the proximal LAD constituting 2.26mm area at the site of stenosis.  Total lesion length measured 24 mm.  There was diffuse disease in the mid to distal LAD. Intervention: Successful IVUS guided PTCA and stenting of the proximal and mid LAD with implantation of a 3.0 x 26 mm resolute Onyx DES, deployed at 12 atmospheric pressure for 45 seconds, proximal segment postdilated with a 3.5 x 12 mm noncompliant sapphire.  Post intervention angiography revealed at least a 60% stenosis distal to the proximal stent in the mid segment of the LAD.  SP 2.5 x 18 mm resolute Onyx DES deployed at 12 atmospheric pressure, same stent balloon utilized to pull proximally into the stent struts overlapping previous stent and a 16 atmospheric pressure inflation x60 seconds was performed.  Overall stenosis reduced to 0% with maintenance of TIMI-3 to TIMI-3 flow. Recommendation: Patient will need dual antiplatelet therapy for at least a period of 6 months followed by aspirin alone.  Smoking cessation discussed.  Carotid duplex: None  Vascular imaging: Lower Extremity Arterial Duplex 01/12/2020: No hemodynamically significant stenoses are identified in the lower extremity arterial system.  There is mild diffuse  disease noted in the below knee vessels with monophasic waveform at the left ankle in AT vessel.  Non compressible vessels due to medial sclerosis.  Consider evaluation for pseudoclaudication.  Holter Monitor for 48 hours  02/01/2019: No symptoms reported.  There are occasional PACs.  Uneventful Holter monitor.  LABORATORY DATA: CBC Latest Ref Rng & Units 05/27/2020 03/27/2020 03/10/2020  WBC 4.0 - 10.5 K/uL 10.8(H) 11.2(H) 11.4(H)  Hemoglobin 12.0 - 15.0 g/dL 14.4 13.6 15.3  Hematocrit 36 - 46 % 43.0 40.0 46.8(H)  Platelets 150 - 400 K/uL 387 408(H) 395    CMP Latest Ref Rng & Units 05/27/2020 03/27/2020 03/06/2020  Glucose 70 - 99 mg/dL 95 144(H) 135(H)  BUN 6 - 20 mg/dL 16 20 18   Creatinine 0.44 - 1.00 mg/dL 1.00 1.10(H) 0.87  Sodium 135 - 145 mmol/L 140 138 144  Potassium 3.5 - 5.1 mmol/L 3.8 3.4(L) 3.8  Chloride 98 - 111 mmol/L 106 106 106  CO2 22 - 32 mmol/L 22 21(L) 22  Calcium 8.9 - 10.3 mg/dL 9.2 9.1 9.4  Total Protein 6.5 -  8.1 g/dL - - -  Total Bilirubin 0.3 - 1.2 mg/dL - - -  Alkaline Phos 38 - 126 U/L - - -  AST 15 - 41 U/L - - -  ALT 14 - 54 U/L - - -    Lipid Panel  No results found for: CHOL, TRIG, HDL, CHOLHDL, VLDL, LDLCALC, LDLDIRECT, LABVLDL  No results found for: HGBA1C No components found for: NTPROBNP No results found for: TSH  Cardiac Panel (last 3 results) No results for input(s): CKTOTAL, CKMB, TROPONINIHS, RELINDX in the last 72 hours.  IMPRESSION:    ICD-10-CM   1. Coronary artery disease involving native coronary artery of native heart with other form of angina pectoris ([place])  I25.118   2. Status post coronary artery stent placement  Z95.5   3. Tobacco abuse  Z72.0   4. Mixed hyperlipidemia  E78.2   5. OSA (obstructive sleep apnea)  G47.33   6. H/O: CVA (cerebrovascular accident) no residual deficit 2018  Z86.73      RECOMMENDATIONS: Mary Benson is a 49 y.o. female whose past medical history and cardiovascular risk factors include:  migraine with aura, premature coronary artery disease status post angioplasty/PCI to the LAD, hypercholesterolemia, obesity, mild obstructive sleep apnea, prior history of stroke in 2018, active tobacco use.  Atherosclerosis of the native coronary artery without angina pectoris status post angioplasty and stent:   Patient underwent left heart catheterization with intervention with 2 DES to the proximal/mid LAD  Dual antiplatelet therapy for 6 months since her last left heart catheterization in June 2021.  Continue Lasix  As needed.  Continue Aldactone.  Continue Toprol-XL 50 mg p.o. daily.  Continue rosuvastatin.  Continue Imdur 60 mg p.o. daily  Educated on the importance of complete smoking cessation.  Since last office visit patient has started to increase her physical activity, is participating cardiac rehab, and is making healthier lifestyle changes as noted above.  Status post angioplasty and stenting: See above  Coronary artery calcification: Continue aspirin and statin therapy.  See above  Mixed hyperlipidemia: Continue statin therapy.  Does not endorse myalgias.  Tobacco use disorder: Educated on importance of complete smoking cessation.    FINAL MEDICATION LIST END OF ENCOUNTER: No orders of the defined types were placed in this encounter.    Current Outpatient Medications:  .  aspirin EC 81 MG tablet, Take 1 tablet (81 mg total) by mouth daily., Disp: 150 tablet, Rfl: 2 .  cetirizine (ZYRTEC) 10 MG tablet, Take 10 mg by mouth daily as needed for allergies. , Disp: , Rfl:  .  clopidogrel (PLAVIX) 75 MG tablet, Take 1 tablet (75 mg total) by mouth daily., Disp: 90 tablet, Rfl: 1 .  ezetimibe (ZETIA) 10 MG tablet, Take 1 tablet (10 mg total) by mouth daily after supper., Disp: 30 tablet, Rfl: 2 .  furosemide (LASIX) 20 MG tablet, Take 1 tablet (20 mg total) by mouth 2 (two) times daily as needed (Dyspnea)., Disp: 180 tablet, Rfl: 0 .  isosorbide mononitrate (IMDUR) 60  MG 24 hr tablet, Take 1 tablet (60 mg total) by mouth daily., Disp: 90 tablet, Rfl: 1 .  metoprolol succinate (TOPROL-XL) 50 MG 24 hr tablet, Take 1 tablet (50 mg total) by mouth in the morning. Take with or immediately following a meal., Disp: 90 tablet, Rfl: 1 .  pantoprazole (PROTONIX) 40 MG tablet, TAKE 1 TABLET BY MOUTH EVERY DAY (Patient taking differently: Take 40 mg by mouth daily. ), Disp: 90 tablet, Rfl:  0 .  rosuvastatin (CRESTOR) 20 MG tablet, Take 1 tablet (20 mg total) by mouth daily., Disp: 90 tablet, Rfl: 3 .  sertraline (ZOLOFT) 25 MG tablet, Take 25 mg by mouth 2 (two) times daily as needed (anxiety). , Disp: , Rfl:  .  spironolactone (ALDACTONE) 25 MG tablet, TAKE 1 TABLET BY MOUTH EVERY DAY, Disp: 30 tablet, Rfl: 2 .  topiramate (TOPAMAX) 25 MG tablet, Take 25 mg by mouth 2 (two) times daily as needed (headaches). , Disp: , Rfl:   No orders of the defined types were placed in this encounter.  --Continue cardiac medications as reconciled in final medication list. --Return in about 6 months (around 01/19/2021) for Follow up, CAD. Or sooner if needed. --Continue follow-up with your primary care physician regarding the management of your other chronic comorbid conditions.  Patient's questions and concerns were addressed to her satisfaction. She voices understanding of the instructions provided during this encounter.   This note was created using a voice recognition software as a result there may be grammatical errors inadvertently enclosed that do not reflect the nature of this encounter. Every attempt is made to correct such errors.  Total time spent: 30 minutes.  Rex Kras, [place], [facility]  Pager: [phone] Office: [phone]

## 2020-07-24 ENCOUNTER — Encounter (HOSPITAL_COMMUNITY): Payer: No Typology Code available for payment source

## 2020-07-24 ENCOUNTER — Telehealth (HOSPITAL_COMMUNITY): Payer: Self-pay | Admitting: *Deleted

## 2020-07-24 NOTE — Telephone Encounter (Signed)
Unable to leave message for patient. Will discharge from cardiac rehab. Mary Benson's last day of attendance was on 06/30/20. Will mail letter to patient to request to return  access  [email or web address] Pall, RN,BSN 07/24/2020 12:09 PM

## 2020-07-24 NOTE — Addendum Note (Signed)
Encounter addended by: George Ina, RD on: 07/24/2020 1:12 PM  Actions taken: Flowsheet accepted

## 2020-07-26 ENCOUNTER — Encounter (HOSPITAL_COMMUNITY): Payer: No Typology Code available for payment source

## 2020-07-28 ENCOUNTER — Encounter (HOSPITAL_COMMUNITY): Payer: No Typology Code available for payment source

## 2020-07-28 ENCOUNTER — Ambulatory Visit: Payer: No Typology Code available for payment source | Admitting: Cardiology

## 2020-07-31 ENCOUNTER — Encounter (HOSPITAL_COMMUNITY): Payer: No Typology Code available for payment source

## 2020-08-01 ENCOUNTER — Other Ambulatory Visit: Payer: Self-pay | Admitting: Cardiology

## 2020-08-02 ENCOUNTER — Encounter (HOSPITAL_COMMUNITY): Payer: No Typology Code available for payment source

## 2020-08-04 ENCOUNTER — Encounter (HOSPITAL_COMMUNITY): Payer: No Typology Code available for payment source

## 2020-08-07 ENCOUNTER — Encounter (HOSPITAL_COMMUNITY): Payer: No Typology Code available for payment source

## 2020-08-09 ENCOUNTER — Encounter (HOSPITAL_COMMUNITY): Payer: No Typology Code available for payment source

## 2020-08-11 ENCOUNTER — Encounter (HOSPITAL_COMMUNITY): Payer: No Typology Code available for payment source

## 2020-08-14 ENCOUNTER — Encounter (HOSPITAL_COMMUNITY): Payer: No Typology Code available for payment source

## 2020-08-16 ENCOUNTER — Encounter (HOSPITAL_COMMUNITY): Payer: No Typology Code available for payment source

## 2020-08-17 ENCOUNTER — Other Ambulatory Visit: Payer: Self-pay | Admitting: Cardiology

## 2020-08-18 ENCOUNTER — Encounter (HOSPITAL_COMMUNITY): Payer: No Typology Code available for payment source

## 2020-08-19 ENCOUNTER — Other Ambulatory Visit: Payer: Self-pay | Admitting: Cardiology

## 2020-08-19 DIAGNOSIS — E782 Mixed hyperlipidemia: Secondary | ICD-10-CM

## 2020-09-09 ENCOUNTER — Other Ambulatory Visit: Payer: Self-pay | Admitting: Cardiology

## 2020-09-11 NOTE — Telephone Encounter (Signed)
Do I fill this prescription for 50mg  or 25mg ? AD/S

## 2020-09-13 ENCOUNTER — Other Ambulatory Visit: Payer: Self-pay

## 2020-09-13 DIAGNOSIS — I2510 Atherosclerotic heart disease of native coronary artery without angina pectoris: Secondary | ICD-10-CM

## 2020-09-13 MED ORDER — METOPROLOL SUCCINATE ER 50 MG PO TB24: 50.0000 mg | ORAL_TABLET | Freq: Every morning | ORAL | 1 refills | Status: DC

## 2020-09-13 NOTE — Telephone Encounter (Signed)
Was this filled for the patient?

## 2020-09-13 NOTE — Telephone Encounter (Signed)
Toprol xl 50mg  po qam

## 2020-11-03 ENCOUNTER — Other Ambulatory Visit: Payer: Self-pay | Admitting: Cardiology

## 2020-11-07 ENCOUNTER — Other Ambulatory Visit: Payer: Self-pay | Admitting: Cardiology

## 2021-01-18 NOTE — Progress Notes (Signed)
Mary Benson Date of Birth: Jul 13, 1971 MRN: [id number] Primary Care Provider:Spencer, Domingo Pulse, PA-C Former Cardiology Providers: Jeri Lager, APRN, FNP-C, Dr. Adrian Prows Primary Cardiologist: Rex Kras, DO, [facility] (established care 12/06/2019)  Date: 01/19/21 Last Office Visit: 07/21/2020  Chief Complaint  Patient presents with  . Coronary artery disease involving native coronary artery of    HPI  Mary Benson is a 50 y.o.  female who presents to the office with a chief complaint of " 6 months follow-up for CAD." Patient's past medical history and cardiovascular risk factors include: migraine with aura, hypercholesterolemia, obesity, mild obstructive sleep apnea, prior history of stroke in 2018, premature coronary artery disease status post angioplasty/PCI to the LAD, active tobacco use.   Patient is noted to have nonobstructive coronary artery disease based on coronary CTA back in 2020.  Despite up titration of guideline directed medical therapy she continued to have symptoms suggestive of anginal equivalent.  She underwent left heart catheterization in June 2021 and was noted to have obstructive CAD in the proximal/mid LAD. She underwent angioplasty and stenting to the proximal/mid LAD.  She is now status post 2 drug-eluting stents to the LAD 3 x 26 mm resolute Onyx DES and a 2.5 x 18 mm resolute Onyx DES.    Since last office visit patient denies any chest pain or anginal equivalent.  Patient states that due to lack of motivation she is not walking as much as she was at the last office visit.  Over the last 6 months patient states that she only walks couple miles a week as opposed to going out for 1.5 miles 3 times a week.  She continues to smoke approximately 4 cigarettes/day.  She denies any heart failure symptoms or recent hospitalizations or urgent care visits symptoms.  Patient is scheduled for endoscopy end of April 2022 and would like to hold Plavix 4 days  prior.  Patient complains of symptoms suggestive of possible sleep apnea.  She has a history of mild obstructive sleep apnea in the past but currently is not using CPAP.  She has never followed through thereafter.  ALLERGIES: Allergies  Allergen Reactions  . Hydrocodone Other (See Comments)    ABD. PAIN  . Penicillins Hives   MEDICATION LIST PRIOR TO VISIT: Current Outpatient Medications on File Prior to Visit  Medication Sig Dispense Refill  . aspirin EC 81 MG tablet Take 1 tablet (81 mg total) by mouth daily. 150 tablet 2  . cetirizine (ZYRTEC) 10 MG tablet Take 10 mg by mouth daily as needed for allergies.     Marland Kitchen clopidogrel (PLAVIX) 75 MG tablet TAKE 1 TABLET BY MOUTH EVERY DAY 90 tablet 1  . furosemide (LASIX) 20 MG tablet Take 1 tablet (20 mg total) by mouth 2 (two) times daily as needed (Dyspnea). 180 tablet 0  . isosorbide mononitrate (IMDUR) 60 MG 24 hr tablet Take 1 tablet (60 mg total) by mouth daily. 90 tablet 1  . metoprolol succinate (TOPROL-XL) 50 MG 24 hr tablet Take 1 tablet (50 mg total) by mouth in the morning. Take with or immediately following a meal. 90 tablet 1  . pantoprazole (PROTONIX) 40 MG tablet TAKE 1 TABLET BY MOUTH EVERY DAY 90 tablet 0  . rosuvastatin (CRESTOR) 20 MG tablet Take 1 tablet (20 mg total) by mouth daily. 90 tablet 3  . spironolactone (ALDACTONE) 25 MG tablet TAKE 1 TABLET BY MOUTH EVERY DAY 90 tablet 1  . topiramate (TOPAMAX) 25 MG tablet Take  25 mg by mouth 2 (two) times daily as needed (headaches).      No current facility-administered medications on file prior to visit.    PAST MEDICAL HISTORY: Past Medical History:  Diagnosis Date  . Anxiety   . Cancer (HCC)    cervical, uterine,   . Coronary artery disease   . Distal radius fracture, right 02/22/2013  . History of cervical cancer   . Hyperlipidemia   . Laceration of forehead 02/22/2013   sutures in place  . Seasonal allergies   . 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 ABDOMINAL HYSTERECTOMY     partial  . CARDIAC CATHETERIZATION    . CESAREAN SECTION  1997  . CHOLECYSTECTOMY    . CORONARY STENT INTERVENTION N/A 03/14/2020   Procedure: CORONARY STENT INTERVENTION;  Surgeon: Adrian Prows, MD;  Location: [facility];  Service: Cardiovascular;  Laterality: N/A;  . KNEE ARTHROSCOPY Right 09/2014  . LAPAROSCOPIC LYSIS OF ADHESIONS  01/31/2006  . LEFT HEART CATH AND CORONARY ANGIOGRAPHY N/A 03/14/2020   Procedure: LEFT HEART CATH AND CORONARY ANGIOGRAPHY;  Surgeon: Adrian Prows, MD;  Location: [facility];  Service: Cardiovascular;  Laterality: N/A;  . OPEN REDUCTION INTERNAL FIXATION (ORIF) DISTAL RADIAL FRACTURE Right 02/24/2013   Procedure: OPEN REDUCTION INTERNAL FIXATION (ORIF) RIGHT DISTAL RADIUS FRACTURE;  Surgeon: Jolyn Nap, MD;  Location: [facility];  Service: Orthopedics;  Laterality: Right;    FAMILY HISTORY: The patient's family history includes Cervical cancer in her mother; Diabetes in her mother and sister; High blood pressure in her mother and sister.   SOCIAL HISTORY:  The patient  reports that she has been smoking cigarettes. She has a 24.00 pack-year smoking history. She has never used smokeless tobacco. She reports current alcohol use. She reports that she does not use drugs.  Review of Systems  Constitutional: Negative for chills, fever and malaise/fatigue.  HENT: Negative for hoarse voice and nosebleeds.   Eyes: Negative for discharge, double vision and pain.  Cardiovascular: Negative for chest pain, claudication, dyspnea on exertion, leg swelling, near-syncope, orthopnea, palpitations, paroxysmal nocturnal dyspnea and syncope.  Respiratory: Positive for snoring. Negative for hemoptysis and shortness of breath.   Musculoskeletal: Negative for muscle cramps and myalgias.  Gastrointestinal: Negative for  abdominal pain, constipation, diarrhea, hematemesis, hematochezia, melena, nausea and vomiting.  Neurological: Negative for dizziness and light-headedness.    PHYSICAL EXAM: Vitals with BMI 01/19/2021 01/19/2021 07/21/2020  Height - 5' 1"  5' 1.5"  Weight - 198 lbs 13 oz 198 lbs  BMI - 09.81 19.14  Systolic 782 956 213  Diastolic 90 95 78  Pulse 89 90 75  Some encounter information is confidential and restricted. Go to Review Flowsheets activity to see all data.    Constitutional: She isoriented to person, place, and time.Vital signs are normal. She appearswell-developedand well-nourished.  HENT:  Head:Normocephalicand atraumatic.  Eyes:Conjunctivaeare normal.  Cardiovascular:Normal rate,regular rhythm,normal heart soundsand intact distal pulses.  Pulmonary/Chest:Effort normaland breath sounds normal. Noaccessory muscle usage. Norespiratory distress.  Abdominal:Soft.Bowel sounds are normal.  Neurological: She isalertand oriented to person, place, and time.  Skin: Skin iswarmand dry.  Psychiatric: She has anormal mood and affect.  RADIOLOGY: Coronary CTA 03/02/2019: Non-cardiac:  Normal. Pulmonary veins drain normally to the left atrium. Calcium Score: 35 Agatston units. Coronary Arteries: Right dominant with no anomalies  LM:  No plaque or stenosis. LAD system: Mixed plaque proximal LAD, mild (<50%) stenosis. Circumflex system: Calcified plaque proximal LCx, no significant stenosis. RCA system: No plaque or stenosis.  IMPRESSION: 1. Coronary artery calcium score 35 Agatston units. This places the patient in the 98th percentile for age and gender, suggesting high risk for future cardiac events. 2. Mild stenosis in the proximal LAD.  CARDIAC DATABASE: EKG: 01/19/2021: Normal sinus rhythm, 82 bpm, normal axis, without underlying ischemia or injury pattern.   Echocardiogram: 02/12/2019: LVEF 55%. Left ventricle cavity is normal in size. Mild  concentric hypertrophy of the left ventricle. Grade II diastolic dysfunction, elevated LAP. Calculated EF 55%. Trace AR. Mild TR, RVSP 58mHG,   Stress Testing:  Lexiscan/Modified BruceSestamibi stress test 01/10/2020: Stress LVEF 62%. Normal myocardial perfusion. TID ratio 1.31. In absence of regional myocardial perfusion defects, this is a nonspecific finding. Clinical correaltion. Low risk study.   Heart Catheterization: Left Heart Catheterization 03/14/20:  LV: Normal LV systolic function.  Normal LVEDP.  No wall motion abnormality. RCA: Dominant.  Normal. Left main: Normal. Circumflex: Moderate to large vessel, minimal luminal irregularity. LAD: Acute angle takeoff, focal tandem 90% and a 80% stenosis in proximal LAD with large D1 in between.  Mid to distal LAD has mild disease. IVUS: High-grade stenosis in the proximal LAD constituting 2.277marea at the site of stenosis.  Total lesion length measured 24 mm.  There was diffuse disease in the mid to distal LAD. Intervention: Successful IVUS guided PTCA and stenting of the proximal and mid LAD with implantation of a 3.0 x 26 mm resolute Onyx DES, deployed at 12 atmospheric pressure for 45 seconds, proximal segment postdilated with a 3.5 x 12 mm noncompliant sapphire.  Post intervention angiography revealed at least a 60% stenosis distal to the proximal stent in the mid segment of the LAD.  SP 2.5 x 18 mm resolute Onyx DES deployed at 12 atmospheric pressure, same stent balloon utilized to pull proximally into the stent struts overlapping previous stent and a 16 atmospheric pressure inflation x60 seconds was performed.  Overall stenosis reduced to 0% with maintenance of TIMI-3 to TIMI-3 flow. Recommendation: Patient taking off of Plavix in 6 months  need dual antiplatelet therapy for at least a period of 6 months followed by aspirin alone.  Smoking cessation discussed.  Carotid duplex: None  Vascular imaging: Lower Extremity Arterial Duplex  01/12/2020: No hemodynamically significant stenoses are identified in the lower extremity arterial system.  There is mild diffuse disease noted in the below knee vessels with monophasic waveform at the left ankle in AT vessel.  Non compressible vessels due to medial sclerosis.  Consider evaluation for pseudoclaudication.  Holter Monitor for 48 hours  02/01/2019: No symptoms reported.  There are occasional PACs.  Uneventful Holter monitor.  LABORATORY DATA: CBC Latest Ref Rng & Units 05/27/2020 03/27/2020 03/10/2020  WBC 4.0 - 10.5 K/uL 10.8(H) 11.2(H) 11.4(H)  Hemoglobin 12.0 - 15.0 g/dL 14.4 13.6 15.3  Hematocrit 36.0 - 46.0 % 43.0 40.0 46.8(H)  Platelets 150 - 400 K/uL 387 408(H) 395    CMP Latest Ref Rng & Units 05/27/2020 03/27/2020 03/06/2020  Glucose 70 - 99 mg/dL 95 144(H) 135(H)  BUN 6 - 20 mg/dL 16 20 18   Creatinine 0.44 - 1.00 mg/dL 1.00 1.10(H) 0.87  Sodium 135 - 145 mmol/L 140 138 144  Potassium 3.5 - 5.1 mmol/L 3.8 3.4(L) 3.8  Chloride 98 - 111 mmol/L 106 106 106  CO2 22 - 32 mmol/L 22 21(L) 22  Calcium 8.9 - 10.3 mg/dL 9.2 9.1 9.4  Total Protein 6.5 - 8.1 g/dL - - -  Total Bilirubin 0.3 - 1.2 mg/dL - - -  Alkaline Phos 38 - 126 U/L - - -  AST 15 - 41 U/L - - -  ALT 14 - 54 U/L - - -    Lipid Panel  No results found for: CHOL, TRIG, HDL, CHOLHDL, VLDL, LDLCALC, LDLDIRECT, LABVLDL  No results found for: HGBA1C No components found for: NTPROBNP No results found for: TSH  Cardiac Panel (last 3 results) No results for input(s): CKTOTAL, CKMB, TROPONINIHS, RELINDX in the last 72 hours.   External Labs: Collected: 05/08/2020. Lipid profile: Total cholesterol 246, triglycerides 336, HDL 39, LDL 145.  IMPRESSION:    ICD-10-CM   1. Coronary artery disease involving native coronary artery of native heart without angina pectoris  I25.10 EKG 12-Lead    CMP14+EGFR    Lipid Panel With LDL/HDL Ratio    LDL cholesterol, direct  2. Status post coronary artery stent placement   Z95.5 CMP14+EGFR    Lipid Panel With LDL/HDL Ratio    LDL cholesterol, direct  3. Tobacco abuse  Z72.0   4. Mixed hyperlipidemia  E78.2 Lipid Panel With LDL/HDL Ratio    LDL cholesterol, direct  5. OSA (obstructive sleep apnea)  G47.33 Ambulatory referral to Neurology  6. H/O: CVA (cerebrovascular accident) no residual deficit 2018  Z86.73      RECOMMENDATIONS: Mary Benson is a 50 y.o. female whose past medical history and cardiovascular risk factors include: migraine with aura, premature coronary artery disease status post angioplasty/PCI to the LAD, hypercholesterolemia, obesity, mild obstructive sleep apnea, prior history of stroke in 2018, active tobacco use.  Atherosclerosis of the native coronary artery without angina pectoris status post angioplasty and stent:   Denies chest pain or anginal equivalent.  Patient underwent left heart catheterization with intervention with 2 DES to the proximal/mid LAD  Has completed 6 months of dual antiplatelet therapy.    Continue Lasix  As needed.  Continue Aldactone.  Continue Toprol-XL 50 mg p.o. daily.  Continue rosuvastatin.  Continue Imdur 60 mg p.o. daily  Educated on the importance of complete smoking cessation.  Will refer to [facility] Dr. Brett Fairy for sleep apnea evaluation.  Encourage her to increase her physical activity to 30 minutes a day 5 days a week as tolerated.  Will discuss dual antiplatelet therapy at the next office visit.   Status post angioplasty and stenting: See above  Coronary artery calcification: Continue aspirin and statin therapy.  See above  Mixed hyperlipidemia: Continue statin therapy.  Does not endorse myalgias.  Tobacco use disorder:  Tobacco cessation counseling:  Currently smoking 4 cigarettes/day  Patient was informed of the dangers of tobacco abuse including stroke, cancer, and MI, as well as benefits of tobacco cessation.  Patient is willing to quit at  this time.  Approximately 7 mins were spent counseling patient cessation techniques. We discussed various methods to help quit smoking, including deciding on a date to quit, joining a support group, pharmacological agents- nicotine gum/patch/lozenges, chantix.   I will reassess her progress at the next follow-up visit  From a cardiovascular standpoint patient is optimized for upcoming noncardiac procedure.  She may proceed with endoscopy and currently has low risk from a cardiovascular standpoint for perioperative complications.  She may hold Plavix for 4 days as directed by her GI specialist for the upcoming endoscopy.  Would still recommend continuation of Plavix  post endoscopy when it is medically safe with regards to appropriate hemostasis.   FINAL MEDICATION LIST END OF ENCOUNTER: No orders of the defined types were placed in this encounter.    Current Outpatient Medications:  .  aspirin EC 81 MG tablet, Take 1 tablet (81 mg total) by mouth daily., Disp: 150 tablet, Rfl: 2 .  cetirizine (ZYRTEC) 10 MG tablet, Take 10 mg by mouth daily as needed for allergies. , Disp: , Rfl:  .  clopidogrel (PLAVIX) 75 MG tablet, TAKE 1 TABLET BY MOUTH EVERY DAY, Disp: 90 tablet, Rfl: 1 .  furosemide (LASIX) 20 MG tablet, Take 1 tablet (20 mg total) by mouth 2 (two) times daily as needed (Dyspnea)., Disp: 180 tablet, Rfl: 0 .  isosorbide mononitrate (IMDUR) 60 MG 24 hr tablet, Take 1 tablet (60 mg total) by mouth daily., Disp: 90 tablet, Rfl: 1 .  metoprolol succinate (TOPROL-XL) 50 MG 24 hr tablet, Take 1 tablet (50 mg total) by mouth in the morning. Take with or immediately following a meal., Disp: 90 tablet, Rfl: 1 .  pantoprazole (PROTONIX) 40 MG tablet, TAKE 1 TABLET BY MOUTH EVERY DAY, Disp: 90 tablet, Rfl: 0 .  rosuvastatin (CRESTOR) 20 MG tablet, Take 1 tablet (20 mg total) by mouth daily., Disp: 90 tablet, Rfl: 3 .  spironolactone (ALDACTONE) 25 MG tablet, TAKE 1 TABLET BY MOUTH EVERY DAY, Disp: 90  tablet, Rfl: 1 .  topiramate (TOPAMAX) 25 MG tablet, Take 25 mg by mouth 2 (two) times daily as needed (headaches). , Disp: , Rfl:   Orders Placed This Encounter  Procedures  . CMP14+EGFR  . Lipid Panel With LDL/HDL Ratio  . LDL cholesterol, direct  . Ambulatory referral to Neurology  . EKG 12-Lead   --Continue cardiac medications as reconciled in final medication list. --Return in about 3 months (around 04/20/2021) for Follow up, CAD, Lipid. Or sooner if needed. --Continue follow-up with your primary care physician regarding the management of your other chronic comorbid conditions.  Patient's questions and concerns were addressed to her satisfaction. She voices understanding of the instructions provided during this encounter.   This note was created using a voice recognition software as a result there may be grammatical errors inadvertently enclosed that do not reflect the nature of this encounter. Every attempt is made to correct such errors.  Total time spent: 30 minutes.  Rex Kras, [place], [facility]  Pager: [phone] Office: [phone]

## 2021-01-19 ENCOUNTER — Encounter: Payer: Self-pay | Admitting: Cardiology

## 2021-01-19 ENCOUNTER — Other Ambulatory Visit: Payer: Self-pay

## 2021-01-19 ENCOUNTER — Ambulatory Visit: Payer: No Typology Code available for payment source | Admitting: Cardiology

## 2021-01-19 VITALS — BP 122/90 | HR 89 | Temp 98.2°F | Resp 16 | Ht 61.0 in | Wt 198.8 lb

## 2021-01-19 DIAGNOSIS — Z955 Presence of coronary angioplasty implant and graft: Secondary | ICD-10-CM

## 2021-01-19 DIAGNOSIS — Z8673 Personal history of transient ischemic attack (TIA), and cerebral infarction without residual deficits: Secondary | ICD-10-CM

## 2021-01-19 DIAGNOSIS — E782 Mixed hyperlipidemia: Secondary | ICD-10-CM

## 2021-01-19 DIAGNOSIS — I2510 Atherosclerotic heart disease of native coronary artery without angina pectoris: Secondary | ICD-10-CM

## 2021-01-19 DIAGNOSIS — Z720 Tobacco use: Secondary | ICD-10-CM

## 2021-01-19 DIAGNOSIS — G4733 Obstructive sleep apnea (adult) (pediatric): Secondary | ICD-10-CM

## 2021-02-05 ENCOUNTER — Other Ambulatory Visit: Payer: Self-pay

## 2021-02-05 DIAGNOSIS — E782 Mixed hyperlipidemia: Secondary | ICD-10-CM

## 2021-02-05 DIAGNOSIS — I2510 Atherosclerotic heart disease of native coronary artery without angina pectoris: Secondary | ICD-10-CM

## 2021-02-05 DIAGNOSIS — I25118 Atherosclerotic heart disease of native coronary artery with other forms of angina pectoris: Secondary | ICD-10-CM

## 2021-02-05 MED ORDER — METOPROLOL SUCCINATE ER 50 MG PO TB24: 50.0000 mg | ORAL_TABLET | Freq: Every morning | ORAL | 1 refills | Status: DC

## 2021-02-05 MED ORDER — ISOSORBIDE MONONITRATE ER 60 MG PO TB24: 60.0000 mg | ORAL_TABLET | Freq: Every day | ORAL | 1 refills | Status: DC

## 2021-02-05 MED ORDER — CLOPIDOGREL BISULFATE 75 MG PO TABS: 1.0000 | ORAL_TABLET | Freq: Every day | ORAL | 1 refills | Status: DC

## 2021-02-05 MED ORDER — SPIRONOLACTONE 25 MG PO TABS: 1.0000 | ORAL_TABLET | Freq: Every day | ORAL | 1 refills | Status: DC

## 2021-02-05 MED ORDER — FUROSEMIDE 20 MG PO TABS
20.0000 mg | ORAL_TABLET | Freq: Two times a day (BID) | ORAL | 0 refills | Status: DC | PRN
Start: 2021-02-05 — End: 2022-07-16

## 2021-02-05 MED ORDER — PANTOPRAZOLE SODIUM 40 MG PO TBEC: 1.0000 | DELAYED_RELEASE_TABLET | Freq: Every day | ORAL | 1 refills | Status: DC

## 2021-02-05 MED ORDER — ROSUVASTATIN CALCIUM 20 MG PO TABS: 20.0000 mg | ORAL_TABLET | Freq: Every day | ORAL | 1 refills | Status: DC

## 2021-02-17 LAB — CMP14+EGFR
ALT: 24 IU/L (ref 0–32)
AST: 19 IU/L (ref 0–40)
Albumin/Globulin Ratio: 1.6 (ref 1.2–2.2)
Albumin: 4.2 g/dL (ref 3.8–4.8)
Alkaline Phosphatase: 96 IU/L (ref 44–121)
BUN/Creatinine Ratio: 14 (ref 9–23)
BUN: 13 mg/dL (ref 6–24)
Bilirubin Total: 0.2 mg/dL (ref 0.0–1.2)
CO2: 22 mmol/L (ref 20–29)
Calcium: 9.6 mg/dL (ref 8.7–10.2)
Chloride: 102 mmol/L (ref 96–106)
Creatinine, Ser: 0.93 mg/dL (ref 0.57–1.00)
Globulin, Total: 2.7 g/dL (ref 1.5–4.5)
Glucose: 116 mg/dL — ABNORMAL HIGH (ref 65–99)
Potassium: 4.6 mmol/L (ref 3.5–5.2)
Sodium: 139 mmol/L (ref 134–144)
Total Protein: 6.9 g/dL (ref 6.0–8.5)
eGFR: 76 mL/min/{1.73_m2} (ref >59)

## 2021-02-17 LAB — LIPID PANEL WITH LDL/HDL RATIO
Cholesterol, Total: 250 mg/dL — ABNORMAL HIGH (ref 100–199)
HDL: 44 mg/dL (ref >39)
LDL Chol Calc (NIH): 165 mg/dL — ABNORMAL HIGH (ref 0–99)
LDL/HDL Ratio: 3.8 ratio — ABNORMAL HIGH (ref 0.0–3.2)
Triglycerides: 220 mg/dL — ABNORMAL HIGH (ref 0–149)
VLDL Cholesterol Cal: 41 mg/dL — ABNORMAL HIGH (ref 5–40)

## 2021-02-17 LAB — LDL CHOLESTEROL, DIRECT: LDL Direct: 162 mg/dL — ABNORMAL HIGH (ref 0–99)

## 2021-02-20 NOTE — Progress Notes (Signed)
Called and spoke with patient regarding her lab results.Call was transferred to front desk staff to schedule FU appointment.

## 2021-03-02 ENCOUNTER — Other Ambulatory Visit: Payer: Self-pay

## 2021-03-02 ENCOUNTER — Encounter: Payer: Self-pay | Admitting: Cardiology

## 2021-03-02 ENCOUNTER — Ambulatory Visit: Payer: No Typology Code available for payment source | Admitting: Cardiology

## 2021-03-02 VITALS — BP 129/87 | HR 85 | Temp 98.4°F | Resp 16 | Ht 61.0 in | Wt 200.8 lb

## 2021-03-02 DIAGNOSIS — Z720 Tobacco use: Secondary | ICD-10-CM

## 2021-03-02 DIAGNOSIS — Z8673 Personal history of transient ischemic attack (TIA), and cerebral infarction without residual deficits: Secondary | ICD-10-CM

## 2021-03-02 DIAGNOSIS — E7800 Pure hypercholesterolemia, unspecified: Secondary | ICD-10-CM

## 2021-03-02 DIAGNOSIS — Z955 Presence of coronary angioplasty implant and graft: Secondary | ICD-10-CM

## 2021-03-02 DIAGNOSIS — I2510 Atherosclerotic heart disease of native coronary artery without angina pectoris: Secondary | ICD-10-CM

## 2021-03-02 DIAGNOSIS — G4733 Obstructive sleep apnea (adult) (pediatric): Secondary | ICD-10-CM

## 2021-03-02 MED ORDER — REPATHA SURECLICK 140 MG/ML ~~LOC~~ SOAJ: 140.0000 mg | SUBCUTANEOUS | 0 refills | Status: DC

## 2021-03-02 MED ORDER — ISOSORBIDE MONONITRATE ER 30 MG PO TB24: 30.0000 mg | ORAL_TABLET | Freq: Every evening | ORAL | 0 refills | Status: DC

## 2021-03-02 MED ORDER — METOPROLOL SUCCINATE ER 25 MG PO TB24: 25.0000 mg | ORAL_TABLET | Freq: Every morning | ORAL | 0 refills | Status: DC

## 2021-03-02 NOTE — Progress Notes (Signed)
Mary Benson Date of Birth: 1971/07/15 MRN: [id number] Primary Care Provider:Spencer, Domingo Pulse, PA-C Former Cardiology Providers: Jeri Lager, APRN, FNP-C, Dr. Adrian Prows Primary Cardiologist: Rex Kras, DO, [facility] (established care 12/06/2019)  Date: 03/02/21 Last Office Visit: 01/19/2021  Chief Complaint  Patient presents with  . Hyperlipidemia  . Follow-up    HPI  Mary Benson is a 50 y.o.  female who presents to the office with a chief complaint of " follow-up for hyperlipidemia management." Patient's past medical history and cardiovascular risk factors include: migraine with aura, hypercholesterolemia, obesity, mild obstructive sleep apnea, prior history of stroke in 2018, premature coronary artery disease status post angioplasty/PCI to the LAD, active tobacco use.   Due to continued chest discomfort patient underwent an ischemic evaluation including a left heart catheterization in June 2021.  She was noted to have obstructive CAD in the proximal/mid LAD and underwent angioplasty and stenting of the proximal/mid LAD with 2 drug-eluting stents (3 x 26 mm resolute Onyx DES and a 2.5 x 18 mm resolute Onyx DES).    Her medical therapy has since then been uptitrated to help improve her angina factors.  However, recently patient states that she was tired taking her medications so for 1 week she stopped taking all her pills and she has been more energetic, less drowsy, and less migraines.  Since last office visit she has not had a chance to follow-up with sleep medicine to be evaluated for sleep apnea.  And continues to smoke at least 7-8 cigarettes/day.  She was called into the office for a sooner appointment given her most recent lipid profile.  Total cholesterol 250 mg/dL, LDL 126mg /dL.  Patient states that she currently taking Crestor at the maximally tolerated doses.  ALLERGIES: Allergies  Allergen Reactions  . Hydrocodone Other (See Comments)    ABD. PAIN  .  Penicillins Hives   MEDICATION LIST PRIOR TO VISIT: Current Outpatient Medications on File Prior to Visit  Medication Sig Dispense Refill  . aspirin EC 81 MG tablet Take 1 tablet (81 mg total) by mouth daily. 150 tablet 2  . cetirizine (ZYRTEC) 10 MG tablet Take 10 mg by mouth daily as needed for allergies.     Marland Kitchen clopidogrel (PLAVIX) 75 MG tablet Take 1 tablet (75 mg total) by mouth daily. 90 tablet 1  . furosemide (LASIX) 20 MG tablet Take 1 tablet (20 mg total) by mouth 2 (two) times daily as needed (Dyspnea). 180 tablet 0  . pantoprazole (PROTONIX) 40 MG tablet Take 1 tablet (40 mg total) by mouth daily. 90 tablet 1  . rosuvastatin (CRESTOR) 20 MG tablet Take 1 tablet (20 mg total) by mouth daily. 90 tablet 1  . spironolactone (ALDACTONE) 25 MG tablet Take 1 tablet (25 mg total) by mouth daily. 90 tablet 1  . topiramate (TOPAMAX) 25 MG tablet Take 25 mg by mouth 2 (two) times daily as needed (headaches).      No current facility-administered medications on file prior to visit.    PAST MEDICAL HISTORY: Past Medical History:  Diagnosis Date  . Anxiety   . Cancer (HCC)    cervical, uterine,   . Coronary artery disease   . Distal radius fracture, right 02/22/2013  . History of cervical cancer   . Hyperlipidemia   . Laceration of forehead 02/22/2013   sutures in place  . Seasonal allergies   . 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 ABDOMINAL HYSTERECTOMY     partial  . CARDIAC CATHETERIZATION    . CESAREAN SECTION  1997  . CHOLECYSTECTOMY    . CORONARY STENT INTERVENTION N/A 03/14/2020   Procedure: CORONARY STENT INTERVENTION;  Surgeon: Adrian Prows, MD;  Location: [facility];  Service: Cardiovascular;  Laterality: N/A;  . KNEE ARTHROSCOPY Right 09/2014  . LAPAROSCOPIC LYSIS OF ADHESIONS  01/31/2006  . LEFT HEART CATH AND CORONARY ANGIOGRAPHY N/A 03/14/2020    Procedure: LEFT HEART CATH AND CORONARY ANGIOGRAPHY;  Surgeon: Adrian Prows, MD;  Location: [facility];  Service: Cardiovascular;  Laterality: N/A;  . OPEN REDUCTION INTERNAL FIXATION (ORIF) DISTAL RADIAL FRACTURE Right 02/24/2013   Procedure: OPEN REDUCTION INTERNAL FIXATION (ORIF) RIGHT DISTAL RADIUS FRACTURE;  Surgeon: Jolyn Nap, MD;  Location: [facility];  Service: Orthopedics;  Laterality: Right;    FAMILY HISTORY: The patient's family history includes Cervical cancer in her mother; Diabetes in her mother and sister; High blood pressure in her mother and sister.   SOCIAL HISTORY:  The patient  reports that she has been smoking cigarettes. She has a 24.00 pack-year smoking history. She has never used smokeless tobacco. She reports current alcohol use. She reports that she does not use drugs.  Review of Systems  Constitutional: Negative for chills, fever and malaise/fatigue.  HENT: Negative for hoarse voice and nosebleeds.   Eyes: Negative for discharge, double vision and pain.  Cardiovascular: Negative for chest pain, claudication, dyspnea on exertion, leg swelling, near-syncope, orthopnea, palpitations, paroxysmal nocturnal dyspnea and syncope.  Respiratory: Positive for snoring. Negative for hemoptysis and shortness of breath.   Musculoskeletal: Negative for muscle cramps and myalgias.  Gastrointestinal: Negative for abdominal pain, constipation, diarrhea, hematemesis, hematochezia, melena, nausea and vomiting.  Neurological: Negative for dizziness and light-headedness.    PHYSICAL EXAM: Vitals with BMI 03/02/2021 01/19/2021 01/19/2021  Height 5\' 1"  - 5\' 1"   Weight 200 lbs 13 oz - 198 lbs 13 oz  BMI 67.61 - 95.09  Systolic 326 712 458  Diastolic 87 90 95  Pulse 85 89 90  Some encounter information is confidential and restricted. Go to Review Flowsheets activity to see all data.   Constitutional: She isoriented to person, place, and time.Vital signs are  normal. She appearswell-developedand well-nourished.  HENT:  Head:Normocephalicand atraumatic.  Eyes:Conjunctivaeare normal.  Cardiovascular:Normal rate,regular rhythm,normal heart soundsand intact distal pulses.  Pulmonary/Chest:Effort normaland breath sounds normal. Noaccessory muscle usage. Norespiratory distress.  Abdominal:Soft.Bowel sounds are normal.  Neurological: She isalertand oriented to person, place, and time.  Skin: Skin iswarmand dry.  Psychiatric: She has anormal mood and affect.  RADIOLOGY: Coronary CTA 03/02/2019: Non-cardiac:  Normal. Pulmonary veins drain normally to the left atrium. Calcium Score: 35 Agatston units. Coronary Arteries: Right dominant with no anomalies  LM: No plaque or stenosis. LAD system: Mixed plaque proximal LAD, mild (<50%) stenosis. Circumflex system: Calcified plaque proximal LCx, no significant stenosis. RCA system: No plaque or stenosis.  IMPRESSION: 1. Coronary artery calcium score 35 Agatston units. This places the patient in the 98th percentile for age and gender, suggesting high risk for future cardiac events. 2. Mild stenosis in the proximal LAD.  CARDIAC DATABASE: EKG: 01/19/2021: Normal sinus rhythm, 82 bpm, normal axis, without underlying ischemia or injury pattern.   Echocardiogram: 02/12/2019: LVEF 55%. Left ventricle cavity is normal in size. Mild concentric hypertrophy of the left ventricle. Grade II diastolic dysfunction, elevated LAP. Calculated EF  55%. Trace AR. Mild TR, RVSP 55mmHG,   Stress Testing:  Lexiscan/Modified BruceSestamibi stress test 01/10/2020: Stress LVEF 62%. Normal myocardial perfusion. TID ratio 1.31. In absence of regional myocardial perfusion defects, this is a nonspecific finding. Clinical correaltion. Low risk study.   Heart Catheterization: Left Heart Catheterization 03/14/20:  LV: Normal LV systolic function.  Normal LVEDP.  No wall motion abnormality. RCA: Dominant.   Normal. Left main: Normal. Circumflex: Moderate to large vessel, minimal luminal irregularity. LAD: Acute angle takeoff, focal tandem 90% and a 80% stenosis in proximal LAD with large D1 in between.  Mid to distal LAD has mild disease. IVUS: High-grade stenosis in the proximal LAD constituting 2.84mm area at the site of stenosis.  Total lesion length measured 24 mm.  There was diffuse disease in the mid to distal LAD. Intervention: Successful IVUS guided PTCA and stenting of the proximal and mid LAD with implantation of a 3.0 x 26 mm resolute Onyx DES, deployed at 12 atmospheric pressure for 45 seconds, proximal segment postdilated with a 3.5 x 12 mm noncompliant sapphire.  Post intervention angiography revealed at least a 60% stenosis distal to the proximal stent in the mid segment of the LAD.  SP 2.5 x 18 mm resolute Onyx DES deployed at 12 atmospheric pressure, same stent balloon utilized to pull proximally into the stent struts overlapping previous stent and a 16 atmospheric pressure inflation x60 seconds was performed.  Overall stenosis reduced to 0% with maintenance of TIMI-3 to TIMI-3 flow. Recommendation: Patient taking off of Plavix in 6 months  need dual antiplatelet therapy for at least a period of 6 months followed by aspirin alone.  Smoking cessation discussed.  Carotid duplex: None  Vascular imaging: Lower Extremity Arterial Duplex 01/12/2020: No hemodynamically significant stenoses are identified in the lower extremity arterial system.  There is mild diffuse disease noted in the below knee vessels with monophasic waveform at the left ankle in AT vessel.  Non compressible vessels due to medial sclerosis.  Consider evaluation for pseudoclaudication.  Holter Monitor for 48 hours  02/01/2019: No symptoms reported.  There are occasional PACs.  Uneventful Holter monitor.  LABORATORY DATA: CBC Latest Ref Rng & Units 05/27/2020 03/27/2020 03/10/2020  WBC 4.0 - 10.5 K/uL 10.8(H) 11.2(H)  11.4(H)  Hemoglobin 12.0 - 15.0 g/dL 14.4 13.6 15.3  Hematocrit 36.0 - 46.0 % 43.0 40.0 46.8(H)  Platelets 150 - 400 K/uL 387 408(H) 395    CMP Latest Ref Rng & Units 02/16/2021 05/27/2020 03/27/2020  Glucose 65 - 99 mg/dL 116(H) 95 144(H)  BUN 6 - 24 mg/dL 13 16 20   Creatinine 0.57 - 1.00 mg/dL 0.93 1.00 1.10(H)  Sodium 134 - 144 mmol/L 139 140 138  Potassium 3.5 - 5.2 mmol/L 4.6 3.8 3.4(L)  Chloride 96 - 106 mmol/L 102 106 106  CO2 20 - 29 mmol/L 22 22 21(L)  Calcium 8.7 - 10.2 mg/dL 9.6 9.2 9.1  Total Protein 6.0 - 8.5 g/dL 6.9 - -  Total Bilirubin 0.0 - 1.2 mg/dL 0.2 - -  Alkaline Phos 44 - 121 IU/L 96 - -  AST 0 - 40 IU/L 19 - -  ALT 0 - 32 IU/L 24 - -    Lipid Panel     Component Value Date/Time   CHOL 250 (H) 02/16/2021 0852   TRIG 220 (H) 02/16/2021 0852   HDL 44 02/16/2021 0852   LDLCALC 165 (H) 02/16/2021 0852   LDLDIRECT 162 (H) 02/16/2021 0852   LABVLDL 41 (H) 02/16/2021 1027  No results found for: HGBA1C No components found for: NTPROBNP No results found for: TSH  Cardiac Panel (last 3 results) No results for input(s): CKTOTAL, CKMB, TROPONINIHS, RELINDX in the last 72 hours.   External Labs: Collected: 05/08/2020. Lipid profile: Total cholesterol 246, triglycerides 336, HDL 39, LDL 145.  IMPRESSION:    ICD-10-CM   1. Pure hypercholesterolemia  E78.00 Evolocumab (REPATHA SURECLICK) 756 MG/ML SOAJ  2. Coronary artery disease involving native coronary artery of native heart without angina pectoris  I25.10 metoprolol succinate (TOPROL XL) 25 MG 24 hr tablet    isosorbide mononitrate (IMDUR) 30 MG 24 hr tablet  3. Status post coronary artery stent placement  Z95.5   4. Tobacco abuse  Z72.0   5. OSA (obstructive sleep apnea)  G47.33   6. H/O: CVA (cerebrovascular accident) no residual deficit 2018  Z86.73      RECOMMENDATIONS: Mary Benson is a 50 y.o. female whose past medical history and cardiovascular risk factors include: migraine with aura,  premature coronary artery disease status post angioplasty/PCI to the LAD, hypercholesterolemia, obesity, mild obstructive sleep apnea, prior history of stroke in 2018, active tobacco use.  Hypercholesterolemia, pure:  Currently on maximally tolerated statin therapy.  Most recent LDL 162 mg/dL  Start Repatha.  Patient is asked to call for nurse visit to teach her how to administer Repatha.    We will check lipid panel prior to the next office visit.  Atherosclerosis of the native coronary artery without angina s/p angioplasty and stent:   Denies chest pain or anginal equivalent.  S/P left heart catheterization with intervention with 2 DES to the proximal/mid LAD  Has completed 6 months of dual antiplatelet therapy.    Continue Lasix  as needed.  Continue Aldactone.  DecreaseToprol-XL 25mg  p.o. daily.  Continue rosuvastatin.  Decrease Imdur 30 mg p.o. daily  Educated on the importance of complete smoking cessation.  Patient is asked to follow-up with Dr. Brett Fairy for sleep apnea evaluation.    Patient states that she continues to gain weight despite being cognizant of her caloric intake.  I have asked her to reach out to her PCP for possible weight loss management consultation for a dietitian consult.  Status post angioplasty and stenting: See above  Coronary artery calcification: Continue aspirin and statin therapy.  See above  Tobacco use disorder:  Tobacco cessation counseling:  Currently smoking 7 cigarettes/day  Patient was informed of the dangers of tobacco abuse including stroke, cancer, and MI, as well as benefits of tobacco cessation.  Patient is willing to quit at this time.  Approximately 9 mins were spent counseling patient cessation techniques. We discussed various methods to help quit smoking, including deciding on a date to quit, joining a support group, pharmacological agents- nicotine gum/patch/lozenges, chantix.   I will reassess her progress at  the next follow-up visit  FINAL MEDICATION LIST END OF ENCOUNTER: Meds ordered this encounter  Medications  . metoprolol succinate (TOPROL XL) 25 MG 24 hr tablet    Sig: Take 1 tablet (25 mg total) by mouth in the morning.    Dispense:  90 tablet    Refill:  0  . isosorbide mononitrate (IMDUR) 30 MG 24 hr tablet    Sig: Take 1 tablet (30 mg total) by mouth every evening.    Dispense:  90 tablet    Refill:  0  . Evolocumab (REPATHA SURECLICK) 433 MG/ML SOAJ    Sig: Inject 140 mg into the skin every 14 (fourteen) days.  Dispense:  6 mL    Refill:  0     Current Outpatient Medications:  .  aspirin EC 81 MG tablet, Take 1 tablet (81 mg total) by mouth daily., Disp: 150 tablet, Rfl: 2 .  cetirizine (ZYRTEC) 10 MG tablet, Take 10 mg by mouth daily as needed for allergies. , Disp: , Rfl:  .  clopidogrel (PLAVIX) 75 MG tablet, Take 1 tablet (75 mg total) by mouth daily., Disp: 90 tablet, Rfl: 1 .  Evolocumab (REPATHA SURECLICK) 315 MG/ML SOAJ, Inject 140 mg into the skin every 14 (fourteen) days., Disp: 6 mL, Rfl: 0 .  furosemide (LASIX) 20 MG tablet, Take 1 tablet (20 mg total) by mouth 2 (two) times daily as needed (Dyspnea)., Disp: 180 tablet, Rfl: 0 .  isosorbide mononitrate (IMDUR) 30 MG 24 hr tablet, Take 1 tablet (30 mg total) by mouth every evening., Disp: 90 tablet, Rfl: 0 .  metoprolol succinate (TOPROL XL) 25 MG 24 hr tablet, Take 1 tablet (25 mg total) by mouth in the morning., Disp: 90 tablet, Rfl: 0 .  pantoprazole (PROTONIX) 40 MG tablet, Take 1 tablet (40 mg total) by mouth daily., Disp: 90 tablet, Rfl: 1 .  rosuvastatin (CRESTOR) 20 MG tablet, Take 1 tablet (20 mg total) by mouth daily., Disp: 90 tablet, Rfl: 1 .  spironolactone (ALDACTONE) 25 MG tablet, Take 1 tablet (25 mg total) by mouth daily., Disp: 90 tablet, Rfl: 1 .  topiramate (TOPAMAX) 25 MG tablet, Take 25 mg by mouth 2 (two) times daily as needed (headaches). , Disp: , Rfl:   No orders of the defined types were  placed in this encounter.  --Continue cardiac medications as reconciled in final medication list. --Return in about 3 months (around 06/02/2021) for Follow up, Lipid, CAD. Or sooner if needed. --Continue follow-up with your primary care physician regarding the management of your other chronic comorbid conditions.  Patient's questions and concerns were addressed to her satisfaction. She voices understanding of the instructions provided during this encounter.   This note was created using a voice recognition software as a result there may be grammatical errors inadvertently enclosed that do not reflect the nature of this encounter. Every attempt is made to correct such errors.  Total time spent: 30 minutes.  Rex Kras, [place], [facility]  Pager: [phone] Office: [phone]

## 2021-03-05 ENCOUNTER — Other Ambulatory Visit: Payer: Self-pay

## 2021-03-05 DIAGNOSIS — E7800 Pure hypercholesterolemia, unspecified: Secondary | ICD-10-CM

## 2021-03-05 MED ORDER — REPATHA SURECLICK 140 MG/ML ~~LOC~~ SOAJ: 140.0000 mg | SUBCUTANEOUS | 0 refills | Status: DC

## 2021-03-07 ENCOUNTER — Other Ambulatory Visit: Payer: Self-pay

## 2021-03-28 ENCOUNTER — Institutional Professional Consult (permissible substitution): Payer: No Typology Code available for payment source | Admitting: Neurology

## 2021-04-17 ENCOUNTER — Emergency Department (HOSPITAL_BASED_OUTPATIENT_CLINIC_OR_DEPARTMENT_OTHER): Payer: No Typology Code available for payment source

## 2021-04-17 ENCOUNTER — Encounter (HOSPITAL_BASED_OUTPATIENT_CLINIC_OR_DEPARTMENT_OTHER): Payer: Self-pay | Admitting: *Deleted

## 2021-04-17 ENCOUNTER — Other Ambulatory Visit: Payer: Self-pay

## 2021-04-17 ENCOUNTER — Emergency Department (HOSPITAL_BASED_OUTPATIENT_CLINIC_OR_DEPARTMENT_OTHER)
Admission: EM | Admit: 2021-04-17 | Discharge: 2021-04-18 | Disposition: A | Discharge status: Home / Self Care | Payer: No Typology Code available for payment source | Attending: Emergency Medicine | Admitting: Emergency Medicine

## 2021-04-17 DIAGNOSIS — R1031 Right lower quadrant pain: Secondary | ICD-10-CM | POA: Diagnosis present

## 2021-04-17 DIAGNOSIS — R112 Nausea with vomiting, unspecified: Secondary | ICD-10-CM | POA: Insufficient documentation

## 2021-04-17 DIAGNOSIS — Z8541 Personal history of malignant neoplasm of cervix uteri: Secondary | ICD-10-CM | POA: Insufficient documentation

## 2021-04-17 DIAGNOSIS — I700 Atherosclerosis of aorta: Secondary | ICD-10-CM | POA: Diagnosis not present

## 2021-04-17 DIAGNOSIS — I2510 Atherosclerotic heart disease of native coronary artery without angina pectoris: Secondary | ICD-10-CM | POA: Insufficient documentation

## 2021-04-17 DIAGNOSIS — K760 Fatty (change of) liver, not elsewhere classified: Secondary | ICD-10-CM | POA: Insufficient documentation

## 2021-04-17 DIAGNOSIS — Z8542 Personal history of malignant neoplasm of other parts of uterus: Secondary | ICD-10-CM | POA: Diagnosis not present

## 2021-04-17 DIAGNOSIS — B9689 Other specified bacterial agents as the cause of diseases classified elsewhere: Secondary | ICD-10-CM | POA: Diagnosis not present

## 2021-04-17 DIAGNOSIS — F17210 Nicotine dependence, cigarettes, uncomplicated: Secondary | ICD-10-CM | POA: Diagnosis not present

## 2021-04-17 DIAGNOSIS — R197 Diarrhea, unspecified: Secondary | ICD-10-CM | POA: Insufficient documentation

## 2021-04-17 DIAGNOSIS — R110 Nausea: Secondary | ICD-10-CM

## 2021-04-17 LAB — URINALYSIS, ROUTINE W REFLEX MICROSCOPIC
Bilirubin Urine: NEGATIVE
Glucose, UA: NEGATIVE mg/dL
Ketones, ur: NEGATIVE mg/dL
Nitrite: NEGATIVE
Protein, ur: NEGATIVE mg/dL
Specific Gravity, Urine: 1.009 (ref 1.005–1.030)
pH: 5.5 (ref 5.0–8.0)

## 2021-04-17 LAB — CBC
HCT: 43.7 % (ref 36.0–46.0)
Hemoglobin: 15 g/dL (ref 12.0–15.0)
MCH: 29 pg (ref 26.0–34.0)
MCHC: 34.3 g/dL (ref 30.0–36.0)
MCV: 84.4 fL (ref 80.0–100.0)
Platelets: 356 10*3/uL (ref 150–400)
RBC: 5.18 MIL/uL — ABNORMAL HIGH (ref 3.87–5.11)
RDW: 12.9 % (ref 11.5–15.5)
WBC: 13 10*3/uL — ABNORMAL HIGH (ref 4.0–10.5)
nRBC: 0 % (ref 0.0–0.2)

## 2021-04-17 LAB — PREGNANCY, URINE: Preg Test, Ur: NEGATIVE

## 2021-04-17 LAB — COMPREHENSIVE METABOLIC PANEL
ALT: 20 U/L (ref 0–44)
AST: 22 U/L (ref 15–41)
Albumin: 4.3 g/dL (ref 3.5–5.0)
Alkaline Phosphatase: 82 U/L (ref 38–126)
Anion gap: 12 (ref 5–15)
BUN: 9 mg/dL (ref 6–20)
CO2: 25 mmol/L (ref 22–32)
Calcium: 9.1 mg/dL (ref 8.9–10.3)
Chloride: 102 mmol/L (ref 98–111)
Creatinine, Ser: 0.84 mg/dL (ref 0.44–1.00)
GFR, Estimated: >60 mL/min (ref >60)
Glucose, Bld: 128 mg/dL — ABNORMAL HIGH (ref 70–99)
Potassium: 3.3 mmol/L — ABNORMAL LOW (ref 3.5–5.1)
Sodium: 139 mmol/L (ref 135–145)
Total Bilirubin: 0.3 mg/dL (ref 0.3–1.2)
Total Protein: 7.3 g/dL (ref 6.5–8.1)

## 2021-04-17 LAB — LIPASE, BLOOD: Lipase: 24 U/L (ref 11–51)

## 2021-04-17 MED ORDER — LACTATED RINGERS IV SOLN: INTRAVENOUS | Status: DC

## 2021-04-17 MED ORDER — ONDANSETRON HCL 4 MG/2ML IJ SOLN
4.0000 mg | Freq: Once | INTRAMUSCULAR | Status: AC
  Administered 2021-04-18: 4 mg via INTRAVENOUS
  Filled 2021-04-17: qty 2

## 2021-04-17 MED ORDER — IOHEXOL 300 MG/ML  SOLN
100.0000 mL | Freq: Once | INTRAMUSCULAR | Status: AC | PRN
  Administered 2021-04-18: 100 mL via INTRAVENOUS

## 2021-04-17 MED ORDER — MORPHINE SULFATE (PF) 4 MG/ML IV SOLN
4.0000 mg | Freq: Once | INTRAVENOUS | Status: AC
  Administered 2021-04-18: 4 mg via INTRAVENOUS
  Filled 2021-04-17: qty 1

## 2021-04-17 MED ORDER — KETOROLAC TROMETHAMINE 30 MG/ML IJ SOLN
30.0000 mg | Freq: Once | INTRAMUSCULAR | Status: AC
  Administered 2021-04-17: 30 mg via INTRAVENOUS
  Filled 2021-04-17: qty 1

## 2021-04-17 NOTE — ED Provider Notes (Signed)
Emergency Department Provider Note   I have reviewed the triage vital signs and the nursing notes.   HISTORY  Chief Complaint Abdominal Pain, Emesis, and Nausea   HPI Mary Benson is a 50 y.o. female with past medical history reviewed below presents to the emergency department with right-sided abdominal pain.  Symptom began yesterday morning.  She has had some associated nausea along with vomiting and diarrhea.  No fevers or chills.  No sick contacts.  She has prior surgical history of cholecystectomy as well as abdominal hysterectomy.  She notes a prior CAD history but is not having chest pain/pressure or shortness of breath.  Pain has become more severe which prompted her ED visit.  No dysuria, hesitancy, urgency.  No vaginal bleeding or discharge.  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    Patient Active Problem List   Diagnosis Date Noted   Precordial pain 03/14/2020   Dizziness 01/27/2019   Tobacco abuse 11/09/2018   Anxiety 04/06/2018   Mixed hyperlipidemia 08/14/2017   Thyroid nodule 06/30/2017   Episodes of speech arrest 02/26/2017   MDD (major depressive disorder), single episode, severe with psychotic features ([place]) 10/24/2013    Past Surgical History:  Procedure Laterality Date   ABDOMINAL HYSTERECTOMY     partial   CARDIAC CATHETERIZATION     CESAREAN SECTION  1997   CHOLECYSTECTOMY     CORONARY STENT INTERVENTION N/A 03/14/2020   Procedure: CORONARY STENT INTERVENTION;  Surgeon: Adrian Prows, MD;  Location: [facility];  Service: Cardiovascular;  Laterality: N/A;   KNEE ARTHROSCOPY Right 09/2014   LAPAROSCOPIC LYSIS OF ADHESIONS   01/31/2006   LEFT HEART CATH AND CORONARY ANGIOGRAPHY N/A 03/14/2020   Procedure: LEFT HEART CATH AND CORONARY ANGIOGRAPHY;  Surgeon: Adrian Prows, MD;  Location: [facility];  Service: Cardiovascular;  Laterality: N/A;   OPEN REDUCTION INTERNAL FIXATION (ORIF) DISTAL RADIAL FRACTURE Right 02/24/2013   Procedure: OPEN REDUCTION INTERNAL FIXATION (ORIF) RIGHT DISTAL RADIUS FRACTURE;  Surgeon: Jolyn Nap, MD;  Location: [facility];  Service: Orthopedics;  Laterality: Right;    Allergies Hydrocodone and Penicillins  Family History  Problem Relation Age of Onset   Cervical cancer Mother    Diabetes Mother    High blood pressure Mother    Diabetes Sister    High blood pressure Sister     Social History Social History   Tobacco Use   Smoking status: Light Smoker    Packs/day: 1.00    Years: 24.00    Pack years: 24.00    Types: Cigarettes   Smokeless tobacco: Never  Vaping Use   Vaping Use: Never used  Substance Use Topics   Alcohol use: Yes    Alcohol/week: 0.0 standard drinks    Comment: occasionally   Drug use: No    Review of Systems  Constitutional: No fever/chills Eyes: No visual changes. ENT: No sore throat. Cardiovascular: Denies chest pain. Respiratory: Denies shortness of breath. Gastrointestinal: Positive RLQ abdominal pain. Positive nausea, vomiting, and diarrhea.  No constipation. Genitourinary: Negative for dysuria. Musculoskeletal:  Negative for back pain. Skin: Negative for rash. Neurological: Negative for headaches, focal weakness or numbness.  10-point ROS otherwise negative.  ____________________________________________   PHYSICAL EXAM:  VITAL SIGNS: ED Triage Vitals  Enc Vitals Group     BP 04/17/21 1826 (!) 141/104     Pulse Rate 04/17/21 1826 99     Resp 04/17/21 1826 16     Temp 04/17/21 1826 98.3 F (36.8 C)     Temp src --      SpO2 04/17/21 1826 96 %     Weight 04/17/21 1829 173 lb (78.5 kg)     Height  04/17/21 1829 5\' 1"  (1.549 m)   Constitutional: Alert and oriented. Well appearing and in no acute distress. Eyes: Conjunctivae are normal. Head: Atraumatic. Nose: No congestion/rhinnorhea. Mouth/Throat: Mucous membranes are moist.  Neck: No stridor.   Cardiovascular: Normal rate, regular rhythm. Good peripheral circulation. Grossly normal heart sounds.   Respiratory: Normal respiratory effort.  No retractions. Lungs CTAB. Gastrointestinal: Soft with mild RLQ tenderness. No rebound or guarding. No distention.  Musculoskeletal: No lower extremity tenderness nor edema. No gross deformities of extremities. Neurologic:  Normal speech and language.  Skin:  Skin is warm, dry and intact. No rash noted.  ____________________________________________   LABS (all labs ordered are listed, but only abnormal results are displayed)  Labs Reviewed  COMPREHENSIVE METABOLIC PANEL - Abnormal; Notable for the following components:      Result Value   Potassium 3.3 (*)    Glucose, Bld 128 (*)    All other components within normal limits  CBC - Abnormal; Notable for the following components:   WBC 13.0 (*)    RBC 5.18 (*)    All other components within normal limits  URINALYSIS, ROUTINE W REFLEX MICROSCOPIC - Abnormal; Notable for the following components:   Hgb urine dipstick TRACE (*)    Leukocytes,Ua TRACE (*)    Bacteria, UA RARE (*)    All other components within normal limits  URINE CULTURE  LIPASE, BLOOD  PREGNANCY, URINE   ____________________________________________  RADIOLOGY  CT ABDOMEN PELVIS W CONTRAST  Result Date: 04/18/2021 CLINICAL DATA:  Right lower quadrant abdominal pain, nausea, vomiting, diarrhea EXAM: CT ABDOMEN AND PELVIS WITH CONTRAST TECHNIQUE: Multidetector CT imaging of the abdomen and pelvis was performed using the standard protocol following bolus administration of intravenous contrast. CONTRAST:  186mL OMNIPAQUE IOHEXOL 300 MG/ML  SOLN COMPARISON:  06/09/2017  FINDINGS: Lower chest: The visualized lung bases are clear. Hepatobiliary: Mild hepatic steatosis. No focal intrahepatic mass. No intra or extrahepatic biliary ductal dilation. Cholecystectomy has been performed. Pancreas: Marked atrophy or congenital absence of the body and tail the pancreas. Normal appearance of the head of the pancreas. No peripancreatic inflammatory change identified. Spleen: Unremarkable Adrenals/Urinary Tract: Adrenal glands are unremarkable. Kidneys are normal, without renal calculi, focal lesion, or hydronephrosis. Bladder is unremarkable. Stomach/Bowel: Stomach is within normal limits. Appendix appears normal. No evidence of bowel wall thickening, distention, or inflammatory changes. No free intraperitoneal gas or fluid. Vascular/Lymphatic: Moderate aortoiliac atherosclerotic calcification. No aortic aneurysm. No pathologic adenopathy within the abdomen and pelvis. Reproductive: Uterus absent. Residual ovaries are unremarkable. No adnexal masses. Other: No abdominal wall hernia.  The rectum is unremarkable. Musculoskeletal: No acute bone abnormality. No lytic or blastic bone lesion identified. IMPRESSION: No acute intra-abdominal pathology identified. No definite radiographic explanation for the patient's reported symptoms. Mild hepatic steatosis. Aortic Atherosclerosis (ICD10-I70.0). Electronically Signed   By: Fidela Salisbury MD   On: 04/18/2021  00:39    ____________________________________________   PROCEDURES  Procedure(s) performed:   Procedures  None  ____________________________________________   INITIAL IMPRESSION / ASSESSMENT AND PLAN / ED COURSE  Pertinent labs & imaging results that were available during my care of the patient were reviewed by me and considered in my medical decision making (see chart for details).   Patient presents to the emergency department for evaluation of right lower quadrant abdominal pain.  She has mild tenderness on exam along with  vomiting and diarrhea.  Suspect GI pathology primarily.  Lower suspicion for surgical etiology such as acute appendicitis although is having focal right lower quadrant abdominal pain.  CT imaging obtained to further evaluate this and no acute intra-abdominal pathology was identified.  Patient does have some residual ovaries which are unremarkable.  Doubt torsion.  Patient's lab work is similarly reassuring.  She has some trace leukocytes as well as rare bacteria but no UTI symptoms.  Plan to send urine for culture but will hold on antibiotics for now.  Plan for medication for pain along with vomiting and diarrhea symptom control.  Discussed ED return precautions along with close PCP follow-up plan in the coming week.   ____________________________________________  FINAL CLINICAL IMPRESSION(S) / ED DIAGNOSES  Final diagnoses:  RLQ abdominal pain  Diarrhea, unspecified type  Nausea     MEDICATIONS GIVEN DURING THIS VISIT:  Medications  lactated ringers infusion ( Intravenous Stopped 04/18/21 0138)  ketorolac (TORADOL) 30 MG/ML injection 30 mg (30 mg Intravenous Given 04/17/21 1857)  morphine 4 MG/ML injection 4 mg (4 mg Intravenous Given 04/18/21 0035)  ondansetron (ZOFRAN) injection 4 mg (4 mg Intravenous Given 04/18/21 0035)  iohexol (OMNIPAQUE) 300 MG/ML solution 100 mL (100 mLs Intravenous Contrast Given 04/18/21 0001)     NEW OUTPATIENT MEDICATIONS STARTED DURING THIS VISIT:  Discharge Medication List as of 04/18/2021  1:20 AM     START taking these medications   Details  dicyclomine (BENTYL) 20 MG tablet Take 1 tablet (20 mg total) by mouth 3 (three) times daily as needed for spasms., Starting Wed 04/18/2021, Normal    loperamide (IMODIUM) 2 MG capsule Take 1 capsule (2 mg total) by mouth 4 (four) times daily as needed for diarrhea or loose stools., Starting Wed 04/18/2021, Normal    ondansetron (ZOFRAN ODT) 4 MG disintegrating tablet Take 1 tablet (4 mg total) by mouth every 8  (eight) hours as needed for nausea or vomiting., Starting Wed 04/18/2021, Normal        Note:  This document was prepared using Dragon voice recognition software and may include unintentional dictation errors.  Nanda Quinton, MD, [facility] Emergency Medicine    Tityana Pagan, [facility], MD 04/18/21 [phone]

## 2021-04-17 NOTE — ED Triage Notes (Signed)
RLQ pain since yesterday morning, nausea and vomiting and diarrhea.  Denies fever.

## 2021-04-18 MED ORDER — DICYCLOMINE HCL 20 MG PO TABS: 20.0000 mg | ORAL_TABLET | Freq: Three times a day (TID) | ORAL | 0 refills | Status: DC | PRN

## 2021-04-18 MED ORDER — LOPERAMIDE HCL 2 MG PO CAPS: 2.0000 mg | ORAL_CAPSULE | Freq: Four times a day (QID) | ORAL | 0 refills | Status: DC | PRN

## 2021-04-18 MED ORDER — ONDANSETRON 4 MG PO TBDP: 4.0000 mg | ORAL_TABLET | Freq: Three times a day (TID) | ORAL | 0 refills | Status: DC | PRN

## 2021-04-18 NOTE — Discharge Instructions (Addendum)

## 2021-04-19 LAB — URINE CULTURE: Culture: <10000 — AB

## 2021-04-20 ENCOUNTER — Ambulatory Visit: Payer: No Typology Code available for payment source | Admitting: Cardiology

## 2021-05-01 ENCOUNTER — Institutional Professional Consult (permissible substitution): Payer: No Typology Code available for payment source | Admitting: Neurology

## 2021-05-01 ENCOUNTER — Encounter: Payer: Self-pay | Admitting: Neurology

## 2021-06-06 ENCOUNTER — Ambulatory Visit: Payer: No Typology Code available for payment source | Admitting: Cardiology

## 2022-01-25 ENCOUNTER — Emergency Department (HOSPITAL_BASED_OUTPATIENT_CLINIC_OR_DEPARTMENT_OTHER)
Admission: EM | Admit: 2022-01-25 | Discharge: 2022-01-25 | Disposition: A | Discharge status: Home / Self Care | Payer: No Typology Code available for payment source | Attending: Emergency Medicine | Admitting: Emergency Medicine

## 2022-01-25 ENCOUNTER — Other Ambulatory Visit: Payer: Self-pay

## 2022-01-25 ENCOUNTER — Encounter (HOSPITAL_BASED_OUTPATIENT_CLINIC_OR_DEPARTMENT_OTHER): Payer: Self-pay | Admitting: Emergency Medicine

## 2022-01-25 ENCOUNTER — Emergency Department (HOSPITAL_BASED_OUTPATIENT_CLINIC_OR_DEPARTMENT_OTHER): Payer: No Typology Code available for payment source

## 2022-01-25 DIAGNOSIS — Z7902 Long term (current) use of antithrombotics/antiplatelets: Secondary | ICD-10-CM | POA: Insufficient documentation

## 2022-01-25 DIAGNOSIS — Y990 Civilian activity done for income or pay: Secondary | ICD-10-CM | POA: Insufficient documentation

## 2022-01-25 DIAGNOSIS — W01198A Fall on same level from slipping, tripping and stumbling with subsequent striking against other object, initial encounter: Secondary | ICD-10-CM | POA: Diagnosis not present

## 2022-01-25 DIAGNOSIS — I2510 Atherosclerotic heart disease of native coronary artery without angina pectoris: Secondary | ICD-10-CM | POA: Diagnosis not present

## 2022-01-25 DIAGNOSIS — Z7982 Long term (current) use of aspirin: Secondary | ICD-10-CM | POA: Insufficient documentation

## 2022-01-25 DIAGNOSIS — R42 Dizziness and giddiness: Secondary | ICD-10-CM | POA: Diagnosis present

## 2022-01-25 DIAGNOSIS — S0003XA Contusion of scalp, initial encounter: Secondary | ICD-10-CM | POA: Insufficient documentation

## 2022-01-25 DIAGNOSIS — S060X0A Concussion without loss of consciousness, initial encounter: Secondary | ICD-10-CM | POA: Insufficient documentation

## 2022-01-25 MED ORDER — ONDANSETRON 4 MG PO TBDP
4.0000 mg | ORAL_TABLET | Freq: Once | ORAL | Status: AC
  Administered 2022-01-25: 4 mg via ORAL
  Filled 2022-01-25: qty 1

## 2022-01-25 MED ORDER — ONDANSETRON HCL 4 MG PO TABS: 4.0000 mg | ORAL_TABLET | Freq: Four times a day (QID) | ORAL | 0 refills | Status: DC

## 2022-01-25 MED ORDER — ONDANSETRON HCL 4 MG/2ML IJ SOLN
4.0000 mg | Freq: Once | INTRAMUSCULAR | Status: DC
  Filled 2022-01-25: qty 2

## 2022-01-25 MED ORDER — KETOROLAC TROMETHAMINE 60 MG/2ML IM SOLN
60.0000 mg | Freq: Once | INTRAMUSCULAR | Status: AC
  Administered 2022-01-25: 60 mg via INTRAMUSCULAR
  Filled 2022-01-25: qty 2

## 2022-01-25 NOTE — ED Provider Notes (Signed)
?[facility] DEPT ?Provider Note ? ? ?CSN: [id number] ?Arrival date & time: 01/25/22  1230 ? ?  ? ?History ? ?Chief Complaint  ?Patient presents with  ? Fall  ? ? ?Mary Benson is a 51 y.o. female. ? ?Patient with daily headaches and dizziness and focusing issues for the last 4 to 5 days after having a mechanical fall where she hit her head on concrete.  No neck pain, no weakness or numbness.  She has been able to work through this and had some relief of her symptoms with Tylenol.  But she still feeling headaches at times dizziness at times focuses at times.  History of head traumas in the past but does not think concussions.  Felt nauseous sometimes as well.  History of anxiety, high cholesterol, CAD ? ?The history is provided by the patient.  ? ?  ? ?Home Medications ?Prior to Admission medications   ?Medication Sig Start Date End Date Taking? Authorizing Provider  ?ondansetron (ZOFRAN) 4 MG tablet Take 1 tablet (4 mg total) by mouth every 6 (six) hours. 01/25/22  Yes Peace Jost, DO  ?aspirin EC 81 MG tablet Take 1 tablet (81 mg total) by mouth daily. 03/14/20   Adrian Prows, MD  ?cetirizine (ZYRTEC) 10 MG tablet Take 10 mg by mouth daily as needed for allergies.  01/05/19   [provider]  ?clopidogrel (PLAVIX) 75 MG tablet Take 1 tablet (75 mg total) by mouth daily. 02/05/21   Tolia, Sunit, DO  ?furosemide (LASIX) 20 MG tablet Take 1 tablet (20 mg total) by mouth 2 (two) times daily as needed (Dyspnea). 02/05/21   Tolia, Sunit, DO  ?isosorbide mononitrate (IMDUR) 30 MG 24 hr tablet Take 1 tablet (30 mg total) by mouth every evening. 03/02/21 03/09/22  Tolia, Sunit, DO  ?metoprolol succinate (TOPROL XL) 25 MG 24 hr tablet Take 1 tablet (25 mg total) by mouth in the morning. 03/02/21 03/09/22  Tolia, Sunit, DO  ?pantoprazole (PROTONIX) 40 MG tablet Take 1 tablet (40 mg total) by mouth daily. 02/05/21   Tolia, Sunit, DO  ?rosuvastatin (CRESTOR) 20 MG tablet Take 1 tablet (20 mg total) by  mouth daily. 02/05/21 01/31/22  Tolia, Sunit, DO  ?spironolactone (ALDACTONE) 25 MG tablet Take 1 tablet (25 mg total) by mouth daily. 02/05/21   Rex Kras, DO  ?   ? ?Allergies    ?Hydrocodone and Penicillins   ? ?Review of Systems   ?Review of Systems ? ?Physical Exam ?Updated Vital Signs ? ?ED Triage Vitals [01/25/22 1241]  ?Enc Vitals Group  ?   BP (!) 145/98  ?   Pulse Rate 96  ?   Resp 16  ?   Temp 98.8 ?F (37.1 ?C)  ?   Temp Source Oral  ?   SpO2 99 %  ?   Weight   ?   Height   ?   Head Circumference   ?   Peak Flow   ?   Pain Score   ?   Pain Loc   ?   Pain Edu?   ?   Excl. in [place]?   ? ? ?Physical Exam ?Vitals and nursing note reviewed.  ?Constitutional:   ?   General: She is not in acute distress. ?   Appearance: She is well-developed. She is not ill-appearing.  ?HENT:  ?   Head:  ?   Comments: Hematoma to the occiput ?   Mouth/Throat:  ?   Mouth: Mucous membranes are  moist.  ?Eyes:  ?   Extraocular Movements: Extraocular movements intact.  ?   Conjunctiva/sclera: Conjunctivae normal.  ?   Pupils: Pupils are equal, round, and reactive to light.  ?Cardiovascular:  ?   Rate and Rhythm: Normal rate and regular rhythm.  ?   Pulses: Normal pulses.  ?   Heart sounds: Normal heart sounds. No murmur heard. ?Pulmonary:  ?   Effort: Pulmonary effort is normal. No respiratory distress.  ?   Breath sounds: Normal breath sounds.  ?Abdominal:  ?   Palpations: Abdomen is soft.  ?   Tenderness: There is no abdominal tenderness.  ?Musculoskeletal:     ?   General: No swelling or tenderness.  ?   Cervical back: Normal range of motion and neck supple.  ?   Comments: No midline spinal tenderness  ?Skin: ?   General: Skin is warm and dry.  ?   Capillary Refill: Capillary refill takes less than 2 seconds.  ?Neurological:  ?   General: No focal deficit present.  ?   Mental Status: She is alert and oriented to person, place, and time.  ?   Cranial Nerves: No cranial nerve deficit.  ?   Sensory: No sensory deficit.  ?   Motor: No  weakness.  ?   Coordination: Coordination normal.  ?   Gait: Gait normal.  ?   Comments: 5+ out of 5 strength throughout, normal sensation, no drift, normal finger-nose-finger  ?Psychiatric:     ?   Mood and Affect: Mood normal.  ? ? ?ED Results / Procedures / Treatments   ?Labs ?(all labs ordered are listed, but only abnormal results are displayed) ?Labs Reviewed - No data to display ? ?EKG ?None ? ?Radiology ?CT Head Wo Contrast ? ?Result Date: 01/25/2022 ?CLINICAL DATA:  Head trauma EXAM: CT HEAD WITHOUT CONTRAST TECHNIQUE: Contiguous axial images were obtained from the base of the skull through the vertex without intravenous contrast. RADIATION DOSE REDUCTION: This exam was performed according to the departmental dose-optimization program which includes automated exposure control, adjustment of the mA and/or kV according to patient size and/or use of iterative reconstruction technique. COMPARISON:  CT head 02/21/2017 FINDINGS: Brain: No acute intracranial hemorrhage, mass effect, or herniation. No extra-axial fluid collections. No evidence of acute territorial infarct. No hydrocephalus. Mild patchy hypodensities in the periventricular and subcortical white matter similar to previous study, likely secondary to chronic microvascular ischemic changes, advanced for the patient's age. Vascular: No hyperdense vessel or unexpected calcification. Skull: Normal. Negative for fracture or focal lesion. Sinuses/Orbits: No acute finding. Other: None. IMPRESSION: Chronic changes with no acute intracranial process identified. Electronically Signed   By: Ofilia Neas M.D.   On: 01/25/2022 12:59   ? ?Procedures ?Procedures  ? ? ?Medications Ordered in ED ?Medications - No data to display ? ?ED Course/ Medical Decision Making/ A&P ?  ?                        ?Medical Decision Making ?Amount and/or Complexity of Data Reviewed ?Radiology: ordered. ? ?Risk ?Prescription drug management. ? ? ?Mary Benson is here with  headache.  Normal vitals.  No fever.  History of seizures caused by head trauma in the past, cancer, CAD, high cholesterol.  Patient with mechanical fall about 4 days ago.  Has a hematoma to the right side of her head in the occiput area.  She has been having concussion type symptoms during this time.  Headache still bad today despite some Tylenol use.  Neurologically she is intact.  She has no midline spinal pain.  No other extremity tenderness.  She is able to work through this but symptoms have kept her from being able to function well today.  Overall we will get a head CT to rule out head bleed but suspect that this is concussion especially given her prior history of head traumas.  We will give her dose of IM Toradol, Zofran and have her follow-up with concussion specialist assuming CT scan of head is unremarkable. ? ?CT head per my review and interpretation shows no head bleed.  Overall suspect concussion process.  Understands return precautions and discharged from ED in good condition. ? ?This chart was dictated using voice recognition software.  Despite best efforts to proofread,  errors can occur which can change the documentation meaning.  ? ? ? ? ? ? ? ?Final Clinical Impression(s) / ED Diagnoses ?Final diagnoses:  ?Concussion without loss of consciousness, initial encounter  ? ? ?Rx / DC Orders ?ED Discharge Orders   ? ?      Ordered  ?  ondansetron (ZOFRAN) 4 MG tablet  Every 6 hours       ? 01/25/22 1300  ? ?  ?  ? ?  ? ? ?  ?Lennice Sites, DO ?01/25/22 1305 ? ?

## 2022-01-25 NOTE — ED Notes (Signed)
Pain back of head, swelling and some abrasion from fall on Mon. ?

## 2022-01-25 NOTE — ED Notes (Signed)
Discharge paperwork given and understood. 

## 2022-01-25 NOTE — ED Triage Notes (Signed)
Pt slipped going down concrete steps Monday morning, hitting posterior head on step above.  Pt reports constant headache ever since, describes as "burning." ?

## 2022-01-25 NOTE — Discharge Instructions (Addendum)
Head CT is normal.  Believe that you have a concussion. ? ?Recommend rest and hydration.  Recommend Tylenol and ibuprofen as needed for headaches.  I have written you for Zofran for nausea symptoms as well. ?

## 2022-07-15 ENCOUNTER — Other Ambulatory Visit: Payer: Self-pay

## 2022-07-15 ENCOUNTER — Emergency Department (HOSPITAL_COMMUNITY)
Admission: EM | Admit: 2022-07-15 | Discharge: 2022-07-16 | Disposition: A | Discharge status: Home / Self Care | Payer: No Typology Code available for payment source | Attending: Emergency Medicine | Admitting: Emergency Medicine

## 2022-07-15 ENCOUNTER — Encounter (HOSPITAL_COMMUNITY): Payer: Self-pay

## 2022-07-15 ENCOUNTER — Emergency Department (HOSPITAL_COMMUNITY): Payer: No Typology Code available for payment source

## 2022-07-15 DIAGNOSIS — I2510 Atherosclerotic heart disease of native coronary artery without angina pectoris: Secondary | ICD-10-CM

## 2022-07-15 DIAGNOSIS — R079 Chest pain, unspecified: Secondary | ICD-10-CM | POA: Diagnosis present

## 2022-07-15 DIAGNOSIS — Z7982 Long term (current) use of aspirin: Secondary | ICD-10-CM | POA: Insufficient documentation

## 2022-07-15 DIAGNOSIS — Z7901 Long term (current) use of anticoagulants: Secondary | ICD-10-CM | POA: Diagnosis not present

## 2022-07-15 DIAGNOSIS — Z79899 Other long term (current) drug therapy: Secondary | ICD-10-CM | POA: Diagnosis not present

## 2022-07-15 DIAGNOSIS — I25118 Atherosclerotic heart disease of native coronary artery with other forms of angina pectoris: Secondary | ICD-10-CM

## 2022-07-15 DIAGNOSIS — E782 Mixed hyperlipidemia: Secondary | ICD-10-CM

## 2022-07-15 LAB — BASIC METABOLIC PANEL
Anion gap: 11 (ref 5–15)
BUN: 15 mg/dL (ref 6–20)
CO2: 21 mmol/L — ABNORMAL LOW (ref 22–32)
Calcium: 9 mg/dL (ref 8.9–10.3)
Chloride: 104 mmol/L (ref 98–111)
Creatinine, Ser: 0.94 mg/dL (ref 0.44–1.00)
GFR, Estimated: >60 mL/min (ref >60)
Glucose, Bld: 124 mg/dL — ABNORMAL HIGH (ref 70–99)
Potassium: 3.4 mmol/L — ABNORMAL LOW (ref 3.5–5.1)
Sodium: 136 mmol/L (ref 135–145)

## 2022-07-15 LAB — CBC
HCT: 43.6 % (ref 36.0–46.0)
Hemoglobin: 14.7 g/dL (ref 12.0–15.0)
MCH: 29.8 pg (ref 26.0–34.0)
MCHC: 33.7 g/dL (ref 30.0–36.0)
MCV: 88.3 fL (ref 80.0–100.0)
Platelets: 375 10*3/uL (ref 150–400)
RBC: 4.94 MIL/uL (ref 3.87–5.11)
RDW: 13.2 % (ref 11.5–15.5)
WBC: 13.5 10*3/uL — ABNORMAL HIGH (ref 4.0–10.5)
nRBC: 0 % (ref 0.0–0.2)

## 2022-07-15 LAB — TROPONIN I (HIGH SENSITIVITY): Troponin I (High Sensitivity): 3 ng/L (ref <18)

## 2022-07-15 LAB — I-STAT BETA HCG BLOOD, ED (MC, WL, AP ONLY): I-stat hCG, quantitative: 6.6 m[IU]/mL — ABNORMAL HIGH (ref <5)

## 2022-07-15 NOTE — ED Triage Notes (Addendum)
PER EMS: pt is from home with c/o sudden onset of chest pressure tonight at 2030 that radiated to her jaw + nausea, diaphoresis and near syncope. EMS arrived to find her cool and clammy. She received 2 nitro SL tabs and 324 asa and '4mg'$  zofran IV. 20g R hand.  182/106, HR-100, RR-16, 99% RA  Hx 3 stents/ widow maker

## 2022-07-15 NOTE — ED Provider Triage Note (Signed)
Emergency Medicine Provider Triage Evaluation Note  Mary Benson , a 51 y.o. female  was evaluated in triage.  Pt complains of chest pain.  Patient reports having given her grandchild a bath, and lay down in bed when she felt a sudden pressure in her chest and a sensation of "an elephant standing on her chest".  She also felt like that chest pain was going up into her neck and jaw.  She states that she took 1 0.4 mg of nitroglycerin and chewed 481 mg aspirins with 0 relief.  She then had her daughter-in-law called EMS.  They gave her an additional nitroglycerin which did give her some relief.  She has a history of multiple stents, followed by Dr. Einar Gip.  Denies any fevers, chills, dizziness, diaphoresis, syncope.  She is currently chest pain free. Review of Systems  Positive: As above Negative: As above  Physical Exam  BP 119/85   Pulse 93   Temp 98.2 F (36.8 C) (Oral)   Resp 16   Ht '4\' 11"'$  (1.499 m)   Wt 79.4 kg   SpO2 98%   BMI 35.35 kg/m  Gen:   Awake, no distress   Resp:  Normal effort  MSK:   Moves extremities without difficulty  Other:  CTA B  Medical Decision Making  Medically screening exam initiated at 10:11 PM.  Appropriate orders placed.  Antoinetta A Melick was informed that the remainder of the evaluation will be completed by another provider, this initial triage assessment does not replace that evaluation, and the importance of remaining in the ED until their evaluation is complete.     Garald Balding, PA-C 07/15/22 2212

## 2022-07-16 LAB — D-DIMER, QUANTITATIVE: D-Dimer, Quant: 0.47 ug/mL-FEU (ref 0.00–0.50)

## 2022-07-16 LAB — TROPONIN I (HIGH SENSITIVITY): Troponin I (High Sensitivity): 3 ng/L (ref <18)

## 2022-07-16 MED ORDER — METOPROLOL SUCCINATE ER 25 MG PO TB24: 25.0000 mg | ORAL_TABLET | Freq: Every morning | ORAL | 0 refills | Status: AC

## 2022-07-16 MED ORDER — FUROSEMIDE 20 MG PO TABS: 20.0000 mg | ORAL_TABLET | Freq: Two times a day (BID) | ORAL | 0 refills | Status: AC | PRN

## 2022-07-16 MED ORDER — ROSUVASTATIN CALCIUM 20 MG PO TABS: 20.0000 mg | ORAL_TABLET | Freq: Every day | ORAL | 1 refills | Status: AC

## 2022-07-16 MED ORDER — SPIRONOLACTONE 25 MG PO TABS: 25.0000 mg | ORAL_TABLET | Freq: Every day | ORAL | 1 refills | Status: AC

## 2022-07-16 NOTE — ED Provider Notes (Signed)
[place] EMERGENCY DEPARTMENT Provider Note   CSN: [id number] Arrival date & time: 07/15/22  2158     History  Chief Complaint  Patient presents with   Chest Pain    Mary Benson is a 51 y.o. female.  Patient presents with left-sided chest pain that radiates across her chest and to her left jaw that started last night when she was laying down. It lasted for about 15 minutes. She then went to get up and as she was walking, she experienced brief tunnel vision that completely resolved within 5 minutes. Denies recent illness, dyspnea, leg swelling, nausea, vomiting or other symptoms. She had another episode of chest pain shortly after the initial one that lasted a few minutes but she did not notice any vision changes at that time. Denies any head trauma or loss of consciousness. Reports cough along with her symptoms that started a few weeks ago. She has not had chest pain since about a year, follows with cardiology outpatient. She is supposed to be taking her prescribed medications including 3 medications for hypertension but says that she has not been taking it as she thinks she does not need this because she has been feeling fine. Mother and aunt are also present at bedside. Patient reports that although her chest pain has resolved, she still experiences chest soreness and feels that something is not right that she is unable to completely explain. Also has reflux nearly daily or most days which she does not not take any medications for.   The history is provided by the patient.  Chest Pain Pain quality: pressure, sharp and shooting   Pain quality: not tearing   Pain radiates to:  L jaw Duration:  15 minutes Associated symptoms: AICD problem and cough   Associated symptoms: no dizziness and no shortness of breath   Risk factors: coronary artery disease, high cholesterol, hypertension, obesity and smoking        Home Medications Prior to Admission medications    Medication Sig Start Date End Date Taking? Authorizing Provider  aspirin EC 81 MG tablet Take 1 tablet (81 mg total) by mouth daily. Patient taking differently: Take 81 mg by mouth 3 (three) times a week. 03/14/20  Yes Adrian Prows, MD  aspirin-acetaminophen-caffeine (EXCEDRIN MIGRAINE) [phone] MG tablet Take 2 tablets by mouth every 6 (six) hours as needed for headache.   Yes [provider]  cetirizine (ZYRTEC) 10 MG tablet Take 10 mg by mouth daily as needed for allergies.  01/05/19  Yes [provider]  clopidogrel (PLAVIX) 75 MG tablet Take 1 tablet (75 mg total) by mouth daily. Patient not taking: Reported on 07/16/2022 02/05/21   Tolia, Sunit, DO  furosemide (LASIX) 20 MG tablet Take 1 tablet (20 mg total) by mouth 2 (two) times daily as needed (Dyspnea). 07/16/22   Sonnet Rizor, DO  metoprolol succinate (TOPROL XL) 25 MG 24 hr tablet Take 1 tablet (25 mg total) by mouth in the morning. 07/16/22 10/14/22  Alycen Mack, DO  ondansetron (ZOFRAN) 4 MG tablet Take 1 tablet (4 mg total) by mouth every 6 (six) hours. Patient not taking: Reported on 07/16/2022 01/25/22   Lennice Sites, DO  pantoprazole (PROTONIX) 40 MG tablet Take 1 tablet (40 mg total) by mouth daily. Patient not taking: Reported on 07/16/2022 02/05/21   Tolia, Sunit, DO  rosuvastatin (CRESTOR) 20 MG tablet Take 1 tablet (20 mg total) by mouth daily. 07/16/22 07/11/23  Donney Dice, DO  spironolactone (ALDACTONE) 25  MG tablet Take 1 tablet (25 mg total) by mouth daily. 07/16/22   Donney Dice, DO      Allergies    Hydrocodone and Penicillins    Review of Systems   Review of Systems  Eyes:  Positive for visual disturbance.  Respiratory:  Positive for cough. Negative for shortness of breath.   Cardiovascular:  Positive for chest pain.  Neurological:  Negative for dizziness.  All other systems reviewed and are negative.   Physical Exam Updated Vital Signs BP 110/82   Pulse 78   Temp 97.6 F (36.4 C)  (Oral)   Resp 17   Ht '4\' 11"'$  (1.499 m)   Wt 79.4 kg   SpO2 98%   BMI 35.35 kg/m  Physical Exam Vitals reviewed.  Constitutional:      General: She is not in acute distress.    Appearance: Normal appearance. She is not toxic-appearing.  HENT:     Head: Normocephalic and atraumatic.     Nose: Nose normal.  Eyes:     General: No scleral icterus.       Right eye: No discharge.        Left eye: No discharge.     Extraocular Movements: Extraocular movements intact.     Conjunctiva/sclera: Conjunctivae normal.     Pupils: Pupils are equal, round, and reactive to light.  Neck:     Comments: No evidence of JVD Cardiovascular:     Rate and Rhythm: Normal rate and regular rhythm.     Pulses: Normal pulses.     Heart sounds: Normal heart sounds. No murmur heard.    No gallop.  Pulmonary:     Effort: Pulmonary effort is normal. No respiratory distress.     Breath sounds: Normal breath sounds. No stridor. No wheezing, rhonchi or rales.  Chest:     Chest wall: No tenderness.  Abdominal:     General: Bowel sounds are normal. There is no distension.     Palpations: Abdomen is soft. There is no mass.     Tenderness: There is no abdominal tenderness. There is no guarding or rebound.     Hernia: No hernia is present.  Musculoskeletal:        General: No swelling or tenderness. Normal range of motion.     Cervical back: Normal range of motion and neck supple. No rigidity or tenderness.     Right lower leg: No edema.     Left lower leg: No edema.  Lymphadenopathy:     Cervical: No cervical adenopathy.  Skin:    Capillary Refill: Capillary refill takes less than 2 seconds.     Coloration: Skin is not pale.     Findings: No erythema.  Neurological:     General: No focal deficit present.     Mental Status: She is alert and oriented to person, place, and time. Mental status is at baseline.     Cranial Nerves: No cranial nerve deficit.     Motor: No weakness.  Psychiatric:        Mood  and Affect: Mood normal.        Behavior: Behavior normal.     ED Results / Procedures / Treatments   Labs (all labs ordered are listed, but only abnormal results are displayed) Labs Reviewed  BASIC METABOLIC PANEL - Abnormal; Notable for the following components:      Result Value   Potassium 3.4 (*)    CO2 21 (*)    Glucose, Bld  124 (*)    All other components within normal limits  CBC - Abnormal; Notable for the following components:   WBC 13.5 (*)    All other components within normal limits  I-STAT BETA HCG BLOOD, ED ([facility], [facility], [facility] ONLY) - Abnormal; Notable for the following components:   I-stat hCG, quantitative 6.6 (*)    All other components within normal limits  D-DIMER, QUANTITATIVE  TROPONIN I (HIGH SENSITIVITY)  TROPONIN I (HIGH SENSITIVITY)    EKG EKG Interpretation  Date/Time:  Monday July 15 2022 22:12:11 EDT Ventricular Rate:  87 PR Interval:  136 QRS Duration: 70 QT Interval:  378 QTC Calculation: 454 R Axis:   45 Text Interpretation: Normal sinus rhythm Normal ECG When compared with ECG of 27-May-2020 12:31, PREVIOUS ECG IS PRESENT Confirmed by Elnora Morrison [phone]) on 07/15/2022 10:15:07 PM  Radiology DG Chest 2 View  Result Date: 07/15/2022 CLINICAL DATA:  Chest pain EXAM: CHEST - 2 VIEW COMPARISON:  Chest x-ray 05/27/2020 FINDINGS: The heart size and mediastinal contours are within normal limits. Both lungs are clear. The visualized skeletal structures are unremarkable. IMPRESSION: No active cardiopulmonary disease. Electronically Signed   By: Ronney Asters M.D.   On: 07/15/2022 22:34    Procedures Procedures  None  Medications Ordered in ED Medications - No data to display  ED Course/ Medical Decision Making/ A&P                           Medical Decision Making Amount and/or Complexity of Data Reviewed Labs: ordered. Radiology: ordered.  Risk Prescription drug management.   Patient with extensive cardiac history including  hypertension and CAD presents with chest pain concerning for MI initially. Trops reassuringly within normal limits and EKG normal sinus rhythm so can rule out STEMI. GI etiology also considered as patient has extensive GERD history. She has multiple risk factors including hypertension, CAD and tobacco use. Given HEART score of 4 placing patient in moderate category, still concerned for cardiac etiology or dissection. Aortic dissection detection score 1 so will proceed with d-dimer. D-dimer within normal limits. Patient remains hemodynamically stable for discharge home. Attending, Dr. Vanita Panda, discussed with outpatient cardiology office to ensure patient has close follow up given cardiac history. Discussed with patient to maintain close cardiology and PCP follow up along with taking all her prescribed medications. Refilled crestor, metoprolol, lasix and spironolactone. Discussed the importance of each medication to encourage compliance. Patient voiced understanding and discharged home.    Final Clinical Impression(s) / ED Diagnoses Final diagnoses:  Chest pain, unspecified type    Rx / DC Orders ED Discharge Orders          Ordered    furosemide (LASIX) 20 MG tablet  2 times daily PRN        07/16/22 1146    metoprolol succinate (TOPROL XL) 25 MG 24 hr tablet  Every morning        07/16/22 1146    rosuvastatin (CRESTOR) 20 MG tablet  Daily        07/16/22 1146    spironolactone (ALDACTONE) 25 MG tablet  Daily        07/16/22 1146              Alyssa Mancera, DO 07/16/22 1150    Carmin Muskrat, MD 07/16/22 1453

## 2022-07-16 NOTE — ED Notes (Signed)
Discharge instructions reviewed with patient and family. Prescriptions discussed, family and patient deny any questions or concerns at this time. Patient ambulatory to lobby.

## 2022-07-16 NOTE — Discharge Instructions (Addendum)
You came to the emergency department for chest pain, your EKG and blood work all looked good. Please make sure to follow up with your primary care provider and your cardiologist. Please make sure to take all your medications as prescribed. As we have discussed, I have sent prescriptions for metoprolol, lasix, spironolactone and crestor. If you are having worsening chest pain or shortness of breath then please return.

## 2022-09-26 ENCOUNTER — Emergency Department (HOSPITAL_BASED_OUTPATIENT_CLINIC_OR_DEPARTMENT_OTHER)
Admission: EM | Admit: 2022-09-26 | Discharge: 2022-09-26 | Disposition: A | Discharge status: Home / Self Care | Payer: 59 | Attending: Emergency Medicine | Admitting: Emergency Medicine

## 2022-09-26 DIAGNOSIS — Z7982 Long term (current) use of aspirin: Secondary | ICD-10-CM | POA: Diagnosis not present

## 2022-09-26 DIAGNOSIS — Z79899 Other long term (current) drug therapy: Secondary | ICD-10-CM | POA: Insufficient documentation

## 2022-09-26 DIAGNOSIS — Z8541 Personal history of malignant neoplasm of cervix uteri: Secondary | ICD-10-CM | POA: Insufficient documentation

## 2022-09-26 DIAGNOSIS — F17210 Nicotine dependence, cigarettes, uncomplicated: Secondary | ICD-10-CM | POA: Diagnosis not present

## 2022-09-26 DIAGNOSIS — I2510 Atherosclerotic heart disease of native coronary artery without angina pectoris: Secondary | ICD-10-CM | POA: Diagnosis not present

## 2022-09-26 DIAGNOSIS — N390 Urinary tract infection, site not specified: Secondary | ICD-10-CM | POA: Diagnosis not present

## 2022-09-26 DIAGNOSIS — R1030 Lower abdominal pain, unspecified: Secondary | ICD-10-CM | POA: Diagnosis present

## 2022-09-26 LAB — URINALYSIS, ROUTINE W REFLEX MICROSCOPIC
Bilirubin Urine: NEGATIVE
Glucose, UA: NEGATIVE mg/dL
Hgb urine dipstick: NEGATIVE
Ketones, ur: NEGATIVE mg/dL
Nitrite: POSITIVE — AB
Protein, ur: NEGATIVE mg/dL
Specific Gravity, Urine: 1.013 (ref 1.005–1.030)
pH: 5 (ref 5.0–8.0)

## 2022-09-26 MED ORDER — FOSFOMYCIN TROMETHAMINE 3 G PO PACK
3.0000 g | PACK | Freq: Once | ORAL | Status: AC
  Administered 2022-09-26: 3 g via ORAL
  Filled 2022-09-26: qty 3
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
## 2022-10-03 LAB — URINE CULTURE: Culture: 80000 — AB

## 2022-10-04 ENCOUNTER — Telehealth (HOSPITAL_BASED_OUTPATIENT_CLINIC_OR_DEPARTMENT_OTHER): Payer: Self-pay | Admitting: *Deleted

## 2022-10-04 NOTE — Telephone Encounter (Signed)
Post ED Visit - Positive Culture Follow-up  Culture report reviewed by antimicrobial stewardship pharmacist: [place] Team '[]'$  Elenor Quinones, Pharm.D. '[]'$  Heide Guile, Pharm.D., BCPS AQ-ID '[]'$  Parks Neptune, Pharm.D., BCPS '[]'$  Alycia Rossetti, Pharm.D., BCPS '[]'$  [place], [place].D., BCPS, AAHIVP '[]'$  Legrand Como, Pharm.D., BCPS, AAHIVP '[]'$  Salome Arnt, PharmD, BCPS '[]'$  Johnnette Gourd, PharmD, BCPS '[]'$  Hughes Better, PharmD, BCPS '[]'$  Leeroy Cha, PharmD '[]'$  Laqueta Linden, PharmD, BCPS '[]'$  Albertina Parr, PharmD  [place] Team '[]'$  Leodis Sias, PharmD '[]'$  Lindell Spar, PharmD '[]'$  Royetta Asal, PharmD '[]'$  Graylin Shiver, Rph '[]'$  Rema Fendt) Glennon Mac, PharmD '[]'$  Arlyn Dunning, PharmD '[]'$  Netta Cedars, PharmD '[]'$  Dia Sitter, PharmD '[]'$  Leone Haven, PharmD '[]'$  Gretta Arab, PharmD '[]'$  Theodis Shove, PharmD '[]'$  Peggyann Juba, PharmD '[]'$  Reuel Boom, PharmD   Positive urine culture Treated with Fosfomycin x 1 in ED, organism sensitive to the same and no further patient follow-up is required at this time. Reviewed by Pharmacy  Ardeen Fillers 10/04/2022, 4:49 PM

## 2022-10-21 ENCOUNTER — Other Ambulatory Visit: Payer: Self-pay | Admitting: Family Medicine

## 2022-10-21 DIAGNOSIS — I2510 Atherosclerotic heart disease of native coronary artery without angina pectoris: Secondary | ICD-10-CM

## 2023-02-19 ENCOUNTER — Other Ambulatory Visit (HOSPITAL_BASED_OUTPATIENT_CLINIC_OR_DEPARTMENT_OTHER): Payer: Self-pay

## 2023-02-19 ENCOUNTER — Other Ambulatory Visit: Payer: Self-pay

## 2023-02-19 ENCOUNTER — Emergency Department (HOSPITAL_BASED_OUTPATIENT_CLINIC_OR_DEPARTMENT_OTHER)
Admission: EM | Admit: 2023-02-19 | Discharge: 2023-02-19 | Disposition: A | Discharge status: Home / Self Care | Payer: Self-pay | Attending: Emergency Medicine | Admitting: Emergency Medicine

## 2023-02-19 ENCOUNTER — Emergency Department (HOSPITAL_BASED_OUTPATIENT_CLINIC_OR_DEPARTMENT_OTHER): Payer: Self-pay

## 2023-02-19 ENCOUNTER — Encounter (HOSPITAL_BASED_OUTPATIENT_CLINIC_OR_DEPARTMENT_OTHER): Payer: Self-pay | Admitting: Emergency Medicine

## 2023-02-19 DIAGNOSIS — R112 Nausea with vomiting, unspecified: Secondary | ICD-10-CM | POA: Insufficient documentation

## 2023-02-19 DIAGNOSIS — R1110 Vomiting, unspecified: Secondary | ICD-10-CM | POA: Insufficient documentation

## 2023-02-19 DIAGNOSIS — R1031 Right lower quadrant pain: Secondary | ICD-10-CM | POA: Insufficient documentation

## 2023-02-19 DIAGNOSIS — Z8541 Personal history of malignant neoplasm of cervix uteri: Secondary | ICD-10-CM | POA: Insufficient documentation

## 2023-02-19 DIAGNOSIS — Z7982 Long term (current) use of aspirin: Secondary | ICD-10-CM | POA: Insufficient documentation

## 2023-02-19 DIAGNOSIS — R109 Unspecified abdominal pain: Secondary | ICD-10-CM

## 2023-02-19 DIAGNOSIS — I2510 Atherosclerotic heart disease of native coronary artery without angina pectoris: Secondary | ICD-10-CM | POA: Insufficient documentation

## 2023-02-19 LAB — URINALYSIS, ROUTINE W REFLEX MICROSCOPIC
Bacteria, UA: NONE SEEN
Bilirubin Urine: NEGATIVE
Glucose, UA: NEGATIVE mg/dL
Hgb urine dipstick: NEGATIVE
Ketones, ur: NEGATIVE mg/dL
Leukocytes,Ua: NEGATIVE
Nitrite: NEGATIVE
Protein, ur: NEGATIVE mg/dL
Specific Gravity, Urine: 1.01 (ref 1.005–1.030)
pH: 5 (ref 5.0–8.0)

## 2023-02-19 LAB — CBC
HCT: 44.8 % (ref 36.0–46.0)
Hemoglobin: 15.3 g/dL — ABNORMAL HIGH (ref 12.0–15.0)
MCH: 29.3 pg (ref 26.0–34.0)
MCHC: 34.2 g/dL (ref 30.0–36.0)
MCV: 85.7 fL (ref 80.0–100.0)
Platelets: 375 10*3/uL (ref 150–400)
RBC: 5.23 MIL/uL — ABNORMAL HIGH (ref 3.87–5.11)
RDW: 13 % (ref 11.5–15.5)
WBC: 9.5 10*3/uL (ref 4.0–10.5)
nRBC: 0 % (ref 0.0–0.2)

## 2023-02-19 LAB — HEPATIC FUNCTION PANEL
ALT: 19 U/L (ref 0–44)
AST: 21 U/L (ref 15–41)
Albumin: 4.4 g/dL (ref 3.5–5.0)
Alkaline Phosphatase: 80 U/L (ref 38–126)
Bilirubin, Direct: <0.1 mg/dL (ref 0.0–0.2)
Total Bilirubin: 0.3 mg/dL (ref 0.3–1.2)
Total Protein: 7.4 g/dL (ref 6.5–8.1)

## 2023-02-19 LAB — BASIC METABOLIC PANEL
Anion gap: 8 (ref 5–15)
BUN: 14 mg/dL (ref 6–20)
CO2: 26 mmol/L (ref 22–32)
Calcium: 9.9 mg/dL (ref 8.9–10.3)
Chloride: 103 mmol/L (ref 98–111)
Creatinine, Ser: 0.83 mg/dL (ref 0.44–1.00)
GFR, Estimated: >60 mL/min (ref >60)
Glucose, Bld: 110 mg/dL — ABNORMAL HIGH (ref 70–99)
Potassium: 4.3 mmol/L (ref 3.5–5.1)
Sodium: 137 mmol/L (ref 135–145)

## 2023-02-19 LAB — LIPASE, BLOOD: Lipase: 26 U/L (ref 11–51)

## 2023-02-19 MED ORDER — SODIUM CHLORIDE 0.9 % IV BOLUS
1000.0000 mL | Freq: Once | INTRAVENOUS | Status: AC
  Administered 2023-02-19: 1000 mL via INTRAVENOUS

## 2023-02-19 MED ORDER — MORPHINE SULFATE (PF) 2 MG/ML IV SOLN
2.0000 mg | INTRAVENOUS | Status: DC | PRN
  Administered 2023-02-19: 2 mg via INTRAVENOUS
  Filled 2023-02-19: qty 1

## 2023-02-19 MED ORDER — ONDANSETRON HCL 4 MG/2ML IJ SOLN
4.0000 mg | Freq: Once | INTRAMUSCULAR | Status: AC
  Administered 2023-02-19: 4 mg via INTRAVENOUS
  Filled 2023-02-19: qty 2

## 2023-02-19 MED ORDER — MORPHINE SULFATE (PF) 4 MG/ML IV SOLN: 4.0000 mg | Freq: Once | INTRAVENOUS | Status: DC

## 2023-02-19 MED ORDER — ONDANSETRON 4 MG PO TBDP
4.0000 mg | ORAL_TABLET | Freq: Three times a day (TID) | ORAL | 0 refills | Status: DC | PRN
  Filled 2023-02-19: qty 12, 4d supply, fill #0

## 2023-02-19 NOTE — ED Triage Notes (Signed)
Pt arrives pov, steady gait with c/o RT flank pain, RLQ  x 2 hours with n/v. Denies dysuria

## 2023-02-19 NOTE — ED Notes (Signed)
Patient transported to CT 

## 2023-02-19 NOTE — ED Provider Notes (Signed)
[place] [facility] AT [facility] Provider Note   CSN: [id number] Arrival date & time: 02/19/23  1025     History  Chief Complaint  Patient presents with   Flank Pain    Mary Benson is a 52 y.o. female.  52 year old female with past medical history of seizures, anxiety, cervical cancer, CAD, hyperlipidemia presents with complaint of right lower quadrant abdominal pain.  Reports sudden onset of pain while at work today associated with 2 episodes of vomiting, feeling clammy.  Pain does not radiate, is somewhat worse with pushing in on her left right flank area.  Does not radiate.  Pain has since eased since initial onset but still present.  No history of similar pain previously.  Denies changes in bowel or bladder habits.  Prior abdominal surgery includes hysterectomy, cholecystectomy.  No other complaints or concerns today.       Home Medications Prior to Admission medications   Medication Sig Start Date End Date Taking? Authorizing Provider  ondansetron (ZOFRAN-ODT) 4 MG disintegrating tablet Take 1 tablet (4 mg total) by mouth every 8 (eight) hours as needed for nausea or vomiting. 02/19/23  Yes Jeannie Fend, PA-C  aspirin EC 81 MG tablet Take 1 tablet (81 mg total) by mouth daily. Patient taking differently: Take 81 mg by mouth 3 (three) times a week. 03/14/20   Yates Decamp, MD  aspirin-acetaminophen-caffeine (EXCEDRIN MIGRAINE) [phone] MG tablet Take 2 tablets by mouth every 6 (six) hours as needed for headache.    [provider]  cetirizine (ZYRTEC) 10 MG tablet Take 10 mg by mouth daily as needed for allergies.  01/05/19   [provider]  furosemide (LASIX) 20 MG tablet Take 1 tablet (20 mg total) by mouth 2 (two) times daily as needed (Dyspnea). 07/16/22   Ganta, Anupa, DO  metoprolol succinate (TOPROL XL) 25 MG 24 hr tablet Take 1 tablet (25 mg total) by mouth in the morning. 07/16/22 10/14/22  Ganta, Anupa, DO  pantoprazole  (PROTONIX) 40 MG tablet Take 1 tablet (40 mg total) by mouth daily. Patient not taking: Reported on 07/16/2022 02/05/21   Tolia, Sunit, DO  rosuvastatin (CRESTOR) 20 MG tablet Take 1 tablet (20 mg total) by mouth daily. 07/16/22 07/11/23  Reece Leader, DO  spironolactone (ALDACTONE) 25 MG tablet Take 1 tablet (25 mg total) by mouth daily. 07/16/22   Reece Leader, DO      Allergies    Hydrocodone and Penicillins    Review of Systems   Review of Systems Negative except as per HPI Physical Exam Updated Vital Signs BP 129/71 (BP Location: Right Arm)   Pulse 78   Temp 97.9 F (36.6 C) (Oral)   Resp 16   Wt 81.6 kg   SpO2 99%   BMI 35.15 kg/m  Physical Exam Vitals and nursing note reviewed.  Constitutional:      General: She is not in acute distress.    Appearance: She is well-developed. She is not diaphoretic.  HENT:     Head: Normocephalic and atraumatic.  Cardiovascular:     Rate and Rhythm: Normal rate and regular rhythm.     Pulses: Normal pulses.     Heart sounds: Normal heart sounds.  Pulmonary:     Effort: Pulmonary effort is normal.     Breath sounds: Normal breath sounds.  Abdominal:     Palpations: Abdomen is soft.     Tenderness: There is abdominal tenderness in the right lower quadrant. There is no  right CVA tenderness, left CVA tenderness, guarding or rebound.    Musculoskeletal:     Right lower leg: No edema.     Left lower leg: No edema.  Skin:    General: Skin is warm and dry.     Findings: No erythema or rash.  Neurological:     Mental Status: She is alert and oriented to person, place, and time.  Psychiatric:        Behavior: Behavior normal.     ED Results / Procedures / Treatments   Labs (all labs ordered are listed, but only abnormal results are displayed) Labs Reviewed  URINALYSIS, ROUTINE W REFLEX MICROSCOPIC - Abnormal; Notable for the following components:      Result Value   APPearance HAZY (*)    All other components within normal  limits  BASIC METABOLIC PANEL - Abnormal; Notable for the following components:   Glucose, Bld 110 (*)    All other components within normal limits  CBC - Abnormal; Notable for the following components:   RBC 5.23 (*)    Hemoglobin 15.3 (*)    All other components within normal limits  HEPATIC FUNCTION PANEL  LIPASE, BLOOD    EKG None  Radiology CT Renal Stone Study  Result Date: 02/19/2023 CLINICAL DATA:  Abdominal/flank pain, stone suspected EXAM: CT ABDOMEN AND PELVIS WITHOUT CONTRAST TECHNIQUE: Multidetector CT imaging of the abdomen and pelvis was performed following the standard protocol without IV contrast. RADIATION DOSE REDUCTION: This exam was performed according to the departmental dose-optimization program which includes automated exposure control, adjustment of the mA and/or kV according to patient size and/or use of iterative reconstruction technique. COMPARISON:  None Available. FINDINGS: Lower chest: No acute abnormality. Hepatobiliary: No focal liver abnormality is seen. Status post cholecystectomy. No biliary dilatation. 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 No acute inflammatory changes. Spleen: Normal in size without focal abnormality. Adrenals/Urinary Tract: Adrenal glands are unremarkable. Kidneys are normal, without renal calculi, focal lesion, or hydronephrosis. Bladder is unremarkable. Stomach/Bowel: Stomach is within normal limits. Appendix appears normal. No evidence of bowel wall thickening, distention, or inflammatory changes. Six Vascular/Lymphatic: Aortic atherosclerosis. No enlarged abdominal or pelvic lymph nodes. Reproductive: Status post hysterectomy. No adnexal masses. Other: No abdominal wall hernia or abnormality. No abdominopelvic ascites. Musculoskeletal: No fracture is seen. IMPRESSION: 1. No evidence of acute abnormality. 2.  Aortic Atherosclerosis (ICD10-I70.0). Electronically Signed   By: Feliberto Harts  M.D.   On: 02/19/2023 11:55    Procedures Procedures    Medications Ordered in ED Medications  morphine (PF) 2 MG/ML injection 2 mg (2 mg Intravenous Given 02/19/23 1157)  sodium chloride 0.9 % bolus 1,000 mL (0 mLs Intravenous Stopped 02/19/23 1314)  ondansetron (ZOFRAN) injection 4 mg (4 mg Intravenous Given 02/19/23 1157)    ED Course/ Medical Decision Making/ A&P                             Medical Decision Making Amount and/or Complexity of Data Reviewed Labs: ordered. Radiology: ordered.  Risk Prescription drug management.   This patient presents to the ED for concern of RLQ/right flank pain, this involves an extensive number of treatment options, and is a complaint that carries with it a high risk of complications and morbidity.  The differential diagnosis includes but not limited to appendicitis, kidney stone, mass, colitis, constipation    Co morbidities that complicate the patient evaluation  Hyperlipidemia, seizures, CAD, cervical, uterine cancer (status post hysterectomy)   Additional history obtained:  External records from outside source obtained and reviewed including prior imaging and labs on file for comparison   Lab Tests:  I Ordered, and personally interpreted labs.  The pertinent results include: Lipase within normal notes.  CMP without second findings.  CBC without significant findings.  Urinalysis is unremarkable.   Imaging Studies ordered:  I ordered imaging studies including CT abdomen pelvis without contrast I independently visualized and interpreted imaging which showed no acute abnormality, negative for kidney stone, normal appendix I agree with the radiologist interpretation    Problem List / ED Course / Critical interventions / Medication management  52 year old female presents with complaint of sudden severe right lower abdominal pain onset while at work today with vomiting x 2 episodes.  Arrives in the ER with somewhat improved pain  although still having discomfort.  Prior abdominal surgeries to include hysterectomy and cholecystectomy.  Labs reviewed and reassuring, CT without acute findings.  Is unclear for the source of this patient's pain today, images are reviewed personally, consider stool burden through the ascending colon potentially contributing to her pain, recommend MiraLAX and Colace and follow-up with PCP for recheck if not improving. I ordered medication including morphine, Zofran, IV fluids for pain Reevaluation of the patient after these medicines showed that the patient improved I have reviewed the patients home medicines and have made adjustments as needed   Social Determinants of Health:  Has PCP for follow-up   Test / Admission - Considered:  Stable for discharge to recheck with PCP with return to ER precautions         Final Clinical Impression(s) / ED Diagnoses Final diagnoses:  Acute right flank pain  Nausea and vomiting, unspecified vomiting type    Rx / DC Orders ED Discharge Orders          Ordered    ondansetron (ZOFRAN-ODT) 4 MG disintegrating tablet  Every 8 hours PRN        02/19/23 1252              Jeannie Fend, PA-C 02/19/23 1713    Rondel Baton, MD 02/21/23 1317

## 2023-02-19 NOTE — Discharge Instructions (Signed)
Recommend MiraLAX and Colace at home, suspect constipation may be contributing to your pain today. Zofran as needed as prescribed for nausea, use caution as this can also lead to further constipation.  Recheck with your PCP, call today to schedule appointment.  Return to the ER for worsening or concerning symptoms.

## 2023-02-26 ENCOUNTER — Other Ambulatory Visit (HOSPITAL_BASED_OUTPATIENT_CLINIC_OR_DEPARTMENT_OTHER): Payer: Self-pay

## 2023-09-06 ENCOUNTER — Encounter (HOSPITAL_BASED_OUTPATIENT_CLINIC_OR_DEPARTMENT_OTHER): Payer: Self-pay | Admitting: Emergency Medicine

## 2023-09-06 ENCOUNTER — Emergency Department (HOSPITAL_BASED_OUTPATIENT_CLINIC_OR_DEPARTMENT_OTHER): Payer: Self-pay

## 2023-09-06 ENCOUNTER — Emergency Department (HOSPITAL_BASED_OUTPATIENT_CLINIC_OR_DEPARTMENT_OTHER)
Admission: EM | Admit: 2023-09-06 | Discharge: 2023-09-06 | Disposition: A | Discharge status: Home / Self Care | Payer: Self-pay | Attending: Emergency Medicine | Admitting: Emergency Medicine

## 2023-09-06 ENCOUNTER — Other Ambulatory Visit: Payer: Self-pay

## 2023-09-06 DIAGNOSIS — Z7982 Long term (current) use of aspirin: Secondary | ICD-10-CM | POA: Insufficient documentation

## 2023-09-06 DIAGNOSIS — R197 Diarrhea, unspecified: Secondary | ICD-10-CM | POA: Insufficient documentation

## 2023-09-06 DIAGNOSIS — R1084 Generalized abdominal pain: Secondary | ICD-10-CM | POA: Insufficient documentation

## 2023-09-06 DIAGNOSIS — Z20822 Contact with and (suspected) exposure to covid-19: Secondary | ICD-10-CM | POA: Insufficient documentation

## 2023-09-06 DIAGNOSIS — R112 Nausea with vomiting, unspecified: Secondary | ICD-10-CM

## 2023-09-06 LAB — CBC WITH DIFFERENTIAL/PLATELET
Abs Immature Granulocytes: 0.03 10*3/uL (ref 0.00–0.07)
Basophils Absolute: 0 10*3/uL (ref 0.0–0.1)
Basophils Relative: 0 %
Eosinophils Absolute: 0.1 10*3/uL (ref 0.0–0.5)
Eosinophils Relative: 1 %
HCT: 45.3 % (ref 36.0–46.0)
Hemoglobin: 15.5 g/dL — ABNORMAL HIGH (ref 12.0–15.0)
Immature Granulocytes: 0 %
Lymphocytes Relative: 27 %
Lymphs Abs: 2.2 10*3/uL (ref 0.7–4.0)
MCH: 29.2 pg (ref 26.0–34.0)
MCHC: 34.2 g/dL (ref 30.0–36.0)
MCV: 85.3 fL (ref 80.0–100.0)
Monocytes Absolute: 0.6 10*3/uL (ref 0.1–1.0)
Monocytes Relative: 7 %
Neutro Abs: 5.2 10*3/uL (ref 1.7–7.7)
Neutrophils Relative %: 65 %
Platelets: 326 10*3/uL (ref 150–400)
RBC: 5.31 MIL/uL — ABNORMAL HIGH (ref 3.87–5.11)
RDW: 13.2 % (ref 11.5–15.5)
WBC: 8.1 10*3/uL (ref 4.0–10.5)
nRBC: 0 % (ref 0.0–0.2)

## 2023-09-06 LAB — COMPREHENSIVE METABOLIC PANEL WITH GFR
ALT: 30 U/L (ref 0–44)
AST: 28 U/L (ref 15–41)
Albumin: 4.1 g/dL (ref 3.5–5.0)
Alkaline Phosphatase: 99 U/L (ref 38–126)
Anion gap: 8 (ref 5–15)
BUN: 13 mg/dL (ref 6–20)
CO2: 26 mmol/L (ref 22–32)
Calcium: 8.9 mg/dL (ref 8.9–10.3)
Chloride: 102 mmol/L (ref 98–111)
Creatinine, Ser: 0.86 mg/dL (ref 0.44–1.00)
GFR, Estimated: >60 mL/min
Glucose, Bld: 89 mg/dL (ref 70–99)
Potassium: 3.9 mmol/L (ref 3.5–5.1)
Sodium: 136 mmol/L (ref 135–145)
Total Bilirubin: 0.3 mg/dL
Total Protein: 7.4 g/dL (ref 6.5–8.1)

## 2023-09-06 LAB — RESP PANEL BY RT-PCR (RSV, FLU A&B, COVID)  RVPGX2
Influenza A by PCR: NEGATIVE
Influenza B by PCR: NEGATIVE
Resp Syncytial Virus by PCR: NEGATIVE
SARS Coronavirus 2 by RT PCR: NEGATIVE

## 2023-09-06 LAB — LIPASE, BLOOD: Lipase: 18 U/L (ref 11–51)

## 2023-09-06 LAB — MAGNESIUM: Magnesium: 2.1 mg/dL (ref 1.7–2.4)

## 2023-09-06 LAB — C DIFFICILE QUICK SCREEN W PCR REFLEX
C Diff antigen: NEGATIVE
C Diff interpretation: NOT DETECTED
C Diff toxin: NEGATIVE

## 2023-09-06 MED ORDER — DICYCLOMINE HCL 20 MG PO TABS: 20.0000 mg | ORAL_TABLET | Freq: Two times a day (BID) | ORAL | 0 refills | Status: AC

## 2023-09-06 MED ORDER — ONDANSETRON 4 MG PO TBDP: 4.0000 mg | ORAL_TABLET | Freq: Three times a day (TID) | ORAL | 0 refills | Status: AC | PRN

## 2023-09-06 MED ORDER — DIPHENOXYLATE-ATROPINE 2.5-0.025 MG PO TABS: 1.0000 | ORAL_TABLET | Freq: Four times a day (QID) | ORAL | 0 refills | Status: AC | PRN

## 2023-09-06 MED ORDER — ONDANSETRON HCL 4 MG/2ML IJ SOLN
4.0000 mg | Freq: Once | INTRAMUSCULAR | Status: AC
  Administered 2023-09-06: 4 mg via INTRAVENOUS
  Filled 2023-09-06: qty 2

## 2023-09-06 MED ORDER — SODIUM CHLORIDE 0.9 % IV BOLUS
1000.0000 mL | Freq: Once | INTRAVENOUS | Status: AC
  Administered 2023-09-06: 1000 mL via INTRAVENOUS

## 2023-09-06 MED ORDER — IOHEXOL 300 MG/ML  SOLN
100.0000 mL | Freq: Once | INTRAMUSCULAR | Status: AC | PRN
  Administered 2023-09-06: 100 mL via INTRAVENOUS

## 2023-09-06 MED ORDER — MORPHINE SULFATE (PF) 4 MG/ML IV SOLN
4.0000 mg | Freq: Once | INTRAVENOUS | Status: DC
  Filled 2023-09-06: qty 1

## 2023-09-06 NOTE — ED Provider Notes (Addendum)
[place] EMERGENCY DEPARTMENT AT [facility] Provider Note   CSN: [id number] Arrival date & time: 09/06/23  1752     History  Chief Complaint  Patient presents with   Emesis   Diarrhea    Mary Benson is a 52 y.o. female here for evaluation nausea, vomiting and diarrhea.  Started on Thursday.  Greater then 15 episodes of watery stool daily.  She tried Pepto-Bismol as well as Imodium without relief.  Multiple episodes of NBNB emesis.  She has diffuse abdominal cramping.  No bright red blood per rectum or melena.  No recent antibiotics or travel.  Did recently get over influenza 2 weeks ago.  She has had chills without documented fever.  States she feels like everything she eats or drinks "goes right through me."  HPI     Home Medications Prior to Admission medications   Medication Sig Start Date End Date Taking? Authorizing Provider  dicyclomine (BENTYL) 20 MG tablet Take 1 tablet (20 mg total) by mouth 2 (two) times daily. 09/06/23  Yes Ophia Shamoon A, PA-C  diphenoxylate-atropine (LOMOTIL) 2.5-0.025 MG tablet Take 1 tablet by mouth 4 (four) times daily as needed for diarrhea or loose stools. 09/06/23  Yes Chevy Virgo A, PA-C  ondansetron (ZOFRAN-ODT) 4 MG disintegrating tablet Take 1 tablet (4 mg total) by mouth every 8 (eight) hours as needed. 09/06/23  Yes Bertrand Vowels A, PA-C  aspirin EC 81 MG tablet Take 1 tablet (81 mg total) by mouth daily. Patient taking differently: Take 81 mg by mouth 3 (three) times a week. 03/14/20   Yates Decamp, MD  aspirin-acetaminophen-caffeine (EXCEDRIN MIGRAINE) [phone] MG tablet Take 2 tablets by mouth every 6 (six) hours as needed for headache.    [provider]  cetirizine (ZYRTEC) 10 MG tablet Take 10 mg by mouth daily as needed for allergies.  01/05/19   [provider]  furosemide (LASIX) 20 MG tablet Take 1 tablet (20 mg total) by mouth 2 (two) times daily as needed (Dyspnea). 07/16/22   Ganta,  Anupa, DO  metoprolol succinate (TOPROL XL) 25 MG 24 hr tablet Take 1 tablet (25 mg total) by mouth in the morning. 07/16/22 10/14/22  Ganta, Anupa, DO  rosuvastatin (CRESTOR) 20 MG tablet Take 1 tablet (20 mg total) by mouth daily. 07/16/22 07/11/23  Reece Leader, DO  spironolactone (ALDACTONE) 25 MG tablet Take 1 tablet (25 mg total) by mouth daily. 07/16/22   Reece Leader, DO      Allergies    Hydrocodone and Penicillins    Review of Systems   Review of Systems  Constitutional:  Positive for chills and fatigue.  HENT: Negative.    Respiratory: Negative.    Cardiovascular: Negative.   Gastrointestinal:  Positive for abdominal pain, diarrhea, nausea and vomiting. Negative for abdominal distention, anal bleeding, blood in stool, constipation and rectal pain.  Genitourinary: Negative.   Musculoskeletal: Negative.   Skin: Negative.   Neurological: Negative.   All other systems reviewed and are negative.   Physical Exam Updated Vital Signs BP (!) 155/104 (BP Location: Left Arm)   Pulse (!) 104   Temp 98 F (36.7 C) (Oral)   Resp 18   Ht 5' (1.524 m)   Wt 81.6 kg   SpO2 100%   BMI 35.13 kg/m  Physical Exam Vitals and nursing note reviewed.  Constitutional:      General: She is not in acute distress.    Appearance: She is well-developed. She is not ill-appearing, toxic-appearing  or diaphoretic.  HENT:     Head: Normocephalic and atraumatic.     Nose: Nose normal.     Mouth/Throat:     Mouth: Mucous membranes are dry.  Eyes:     Pupils: Pupils are equal, round, and reactive to light.  Cardiovascular:     Rate and Rhythm: Normal rate.     Pulses: Normal pulses.     Heart sounds: Normal heart sounds.  Pulmonary:     Effort: Pulmonary effort is normal. No respiratory distress.     Breath sounds: Normal breath sounds.     Comments: Clear bilaterally, speaks in full sentences without difficulty Abdominal:     General: Bowel sounds are normal. There is no distension.      Palpations: Abdomen is soft.     Tenderness: There is abdominal tenderness. There is no right CVA tenderness, left CVA tenderness, guarding or rebound.     Comments: Soft, generalized tenderness, no rebound or guarding  Musculoskeletal:        General: No swelling, tenderness, deformity or signs of injury. Normal range of motion.     Cervical back: Normal range of motion.     Right lower leg: No edema.  Skin:    General: Skin is warm and dry.     Capillary Refill: Capillary refill takes less than 2 seconds.  Neurological:     General: No focal deficit present.     Mental Status: She is alert.     Cranial Nerves: No cranial nerve deficit.     Sensory: No sensory deficit.     Motor: No weakness.     Gait: Gait normal.  Psychiatric:        Mood and Affect: Mood normal.     ED Results / Procedures / Treatments   Labs (all labs ordered are listed, but only abnormal results are displayed) Labs Reviewed  CBC WITH DIFFERENTIAL/PLATELET - Abnormal; Notable for the following components:      Result Value   RBC 5.31 (*)    Hemoglobin 15.5 (*)    All other components within normal limits  C DIFFICILE QUICK SCREEN W PCR REFLEX    RESP PANEL BY RT-PCR (RSV, FLU A&B, COVID)  [id number]  GASTROINTESTINAL PANEL BY PCR, STOOL (REPLACES STOOL CULTURE)  COMPREHENSIVE METABOLIC PANEL  LIPASE, BLOOD  MAGNESIUM  URINALYSIS, ROUTINE W REFLEX MICROSCOPIC    EKG None  Radiology CT ABDOMEN PELVIS W CONTRAST  Result Date: 09/06/2023 CLINICAL DATA:  Abdominal pain, nausea/vomiting. History of cervical cancer. EXAM: CT ABDOMEN AND PELVIS WITH CONTRAST TECHNIQUE: Multidetector CT imaging of the abdomen and pelvis was performed using the standard protocol following bolus administration of intravenous contrast. RADIATION DOSE REDUCTION: This exam was performed according to the departmental dose-optimization program which includes automated exposure control, adjustment of the mA and/or kV according to  patient size and/or use of iterative reconstruction technique. CONTRAST:  OMNIPAQUE IOHEXOL 300 MG/ML  SOLN COMPARISON:  02/19/2023 FINDINGS: Lower chest: Lung bases are clear. Hepatobiliary: Liver is within normal limits, noting focal fat/altered perfusion along the falciform ligament. Status post cholecystectomy. No intrahepatic or extrahepatic ductal dilatation. Pancreas: The pancreatic head/uncinate process are within normal limits. Absence of the pancreatic body/tail. Spleen: Within normal limits. Adrenals/Urinary Tract: Adrenal glands are within normal limits. Kidneys are normal limits.  No hydronephrosis. Bladder is within normal limits. Stomach/Bowel: Stomach is within normal limits. No evidence of bowel obstruction. Normal appendix (series 2/image 34). No colonic wall thickening or inflammatory changes. Vascular/Lymphatic:  No evidence of abdominal aortic aneurysm. Atherosclerotic calcifications of the abdominal aorta and branch vessels, although vessels remain patent. No suspicious abdominopelvic lymphadenopathy. Reproductive: Status post hysterectomy. Bilateral ovaries are within normal limits. Other: No abdominopelvic ascites. Musculoskeletal: Visualized osseous structures are within normal limits. IMPRESSION: No CT findings to account for the patient's abdominal pain. Status post hysterectomy. No evidence of recurrent or metastatic disease. Electronically Signed   By: Charline Bills M.D.   On: 09/06/2023 21:05    Procedures Procedures    Medications Ordered in ED Medications  morphine (PF) 4 MG/ML injection 4 mg (4 mg Intravenous Patient Refused/Not Given 09/06/23 1948)  sodium chloride 0.9 % bolus 1,000 mL (0 mLs Intravenous Stopped 09/06/23 2201)  ondansetron (ZOFRAN) injection 4 mg (4 mg Intravenous Given 09/06/23 1948)  iohexol (OMNIPAQUE) 300 MG/ML solution 100 mL (100 mLs Intravenous Contrast Given 09/06/23 2048)    ED Course/ Medical Decision Making/ A&P   52 year old here for  evaluation nausea vomiting and diarrhea.  Symptoms started on Thursday current.  15-20 episodes daily of watery stool.  No bloody stool.  No recent travel, antibiotics.  Abdomen diffusely tender.  Did recently get over influenza 2 weeks ago.  Plan on labs, imaging and reassess.  No history of C. difficile colitis.  Labs and imaging personally viewed and interpreted:  CBC without leukocytosis CMP without significant abnormality Mag 2.1 Lipase 18 COVID, Flu, RSV neg CT AP without acute abnormality  Patient has had a bowel movement when she is attempted to urinate, cannot collect stool sample for UA however denies any UTI symptoms.  Discussed results with patient.  She is afebrile without any elevated white count.  I written for a few medications to help with her nausea, vomiting and diarrhea.  She is tolerating p.o. intake here.  I will call her back after her GI panel and C. difficile panel result.  If positive we will call in antibiotics at that time.  She is comfortable with that plan.  Return the meantime for any new or worsening symptoms  Patient is nontoxic, nonseptic appearing, in no apparent distress.  Patient's pain and other symptoms adequately managed in emergency department.  Fluid bolus given.  Labs, imaging and vitals reviewed.  Patient does not meet the SIRS or Sepsis criteria.  On repeat exam patient does not have a surgical abdomin and there are no peritoneal signs.  No indication of appendicitis, bowel obstruction, bowel perforation, cholecystitis, diverticulitis, PID, torsion or ectopic pregnancy.  Patient discharged home with symptomatic treatment and given strict instructions for follow-up with their primary care physician.  I have also discussed reasons to return immediately to the ER.  Patient expresses understanding and agrees with plan.  ADDEND: Cdiff panel neg                                 Medical Decision Making Amount and/or Complexity of Data Reviewed Independent  Historian:     Details: Family in room External Data Reviewed: labs, radiology and notes. Labs: ordered. Decision-making details documented in ED Course. Radiology: ordered and independent interpretation performed. Decision-making details documented in ED Course.  Risk OTC drugs. Prescription drug management. Parenteral controlled substances. Decision regarding hospitalization. Diagnosis or treatment significantly limited by social determinants of health.     Final Clinical Impression(s) / ED Diagnoses Final diagnoses:  Nausea vomiting and diarrhea    Rx / DC Orders ED Discharge Orders  Ordered    ondansetron (ZOFRAN-ODT) 4 MG disintegrating tablet  Every 8 hours PRN        09/06/23 2154    dicyclomine (BENTYL) 20 MG tablet  2 times daily        09/06/23 2154    diphenoxylate-atropine (LOMOTIL) 2.5-0.025 MG tablet  4 times daily PRN        09/06/23 2154                Tate Zagal A, PA-C 09/06/23 2205    Virgina Norfolk, DO 09/06/23 2212

## 2023-09-06 NOTE — Discharge Instructions (Addendum)
I have written you for a few medications to help with your symptoms  I will call you tomorrow if your stool samples show any evidence of bacteria needing antibiotics  Bland diet over the next few days  Follow-up with PCP Monday or Tuesday  Return for any worsening symptoms

## 2023-09-06 NOTE — ED Triage Notes (Signed)
Pt via pov from home with emesis and diarrhea since Thursday night. Pt states she had diarrhea many times today; 3 x since arriving in the ED tonight. Pt has been trying to drink electrolyte drinks but then she always has diarrhea after. Pt alert & oriented, nad noted.

## 2023-09-06 NOTE — ED Notes (Signed)
Pt unable to provide a urine sample. 

## 2023-09-07 LAB — GASTROINTESTINAL PANEL BY PCR, STOOL (REPLACES STOOL CULTURE)

## 2023-09-08 NOTE — ED Provider Notes (Signed)
Notified by charge nurse that GI pathogen panel was positive for norovirus. This doesn't require a change in her therapy and generally requires symptomatic treatment which had already been given. It appears from the notes that they mostly wanted to rule out bacterial causes for diarrhea and thus I do not see a need for call back at this time.    Marvina Danner, Barbara Cower, MD 09/08/23 [phone]

## 2024-07-31 ENCOUNTER — Encounter (HOSPITAL_COMMUNITY): Payer: Self-pay

## 2024-07-31 ENCOUNTER — Ambulatory Visit (HOSPITAL_COMMUNITY)
Admission: EM | Admit: 2024-07-31 | Discharge: 2024-07-31 | Disposition: A | Discharge status: Home / Self Care | Payer: Self-pay | Attending: Physician Assistant | Admitting: Physician Assistant

## 2024-07-31 DIAGNOSIS — J029 Acute pharyngitis, unspecified: Secondary | ICD-10-CM

## 2024-07-31 DIAGNOSIS — J020 Streptococcal pharyngitis: Secondary | ICD-10-CM

## 2024-07-31 HISTORY — DX: Essential (primary) hypertension: I10

## 2024-07-31 LAB — POC COVID19/FLU A&B COMBO
Covid Antigen, POC: NEGATIVE
Influenza A Antigen, POC: NEGATIVE
Influenza B Antigen, POC: NEGATIVE

## 2024-07-31 LAB — POCT RAPID STREP A (OFFICE): Rapid Strep A Screen: NEGATIVE

## 2024-07-31 MED ORDER — CEPHALEXIN 500 MG PO CAPS: 500.0000 mg | ORAL_CAPSULE | Freq: Two times a day (BID) | ORAL | 0 refills | Status: AC

## 2024-07-31 MED ORDER — METHYLPREDNISOLONE SODIUM SUCC 125 MG IJ SOLR
80.0000 mg | Freq: Once | INTRAMUSCULAR | Status: AC
  Administered 2024-07-31: 80 mg via INTRAMUSCULAR

## 2024-07-31 MED ORDER — METHYLPREDNISOLONE SODIUM SUCC 125 MG IJ SOLR
INTRAMUSCULAR | Status: AC
  Filled 2024-07-31: qty 2

## 2024-07-31 NOTE — Discharge Instructions (Signed)
 Take antibiotics as prescribed. Recommend continue with ibuprofen  and Tylenol  as needed. Continue with warm salt water gargles. If no improvement or symptoms become worse please return for evaluation.

## 2024-07-31 NOTE — ED Provider Notes (Signed)
 [facility]    CSN: [id number] Arrival date & time: 07/31/24  1012      History   Chief Complaint Chief Complaint  Patient presents with   Cough   Fever   Sore Throat    HPI Mary Benson is a 53 y.o. female.   Patient presents with sore throat, cough, subjective fever that started 2 days ago.  She reports sore throat is so severe she is having trouble swallowing foods.  She does report swallowing liquids okay.  She has tried: Cough medications, Chloraseptic spray, salt water gargles, viscous lidocaine  with minimal relief.  She does report some bodyaches.  Denies shortness of breath.    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    Patient Active Problem List   Diagnosis Date Noted   Precordial pain 03/14/2020   Dizziness 01/27/2019   Tobacco abuse 11/09/2018   Anxiety 04/06/2018   Mixed hyperlipidemia 08/14/2017   Thyroid  nodule 06/30/2017   Episodes of speech arrest 02/26/2017   MDD (major depressive disorder), single episode, severe with psychotic features (HCC) 10/24/2013    Past Surgical History:  Procedure Laterality Date   ABDOMINAL HYSTERECTOMY     partial   CARDIAC CATHETERIZATION     CESAREAN SECTION  1997   CHOLECYSTECTOMY     CORONARY STENT INTERVENTION N/A 03/14/2020   Procedure: CORONARY STENT INTERVENTION;  Surgeon: Ladona Heinz, MD;  Location: [facility];  Service: Cardiovascular;  Laterality: N/A;   KNEE ARTHROSCOPY Right 09/2014   LAPAROSCOPIC LYSIS OF ADHESIONS  01/31/2006   LEFT HEART CATH AND CORONARY ANGIOGRAPHY N/A 03/14/2020   Procedure: LEFT HEART CATH AND CORONARY ANGIOGRAPHY;   Surgeon: Ladona Heinz, MD;  Location: [facility];  Service: Cardiovascular;  Laterality: N/A;   OPEN REDUCTION INTERNAL FIXATION (ORIF) DISTAL RADIAL FRACTURE Right 02/24/2013   Procedure: OPEN REDUCTION INTERNAL FIXATION (ORIF) RIGHT DISTAL RADIUS FRACTURE;  Surgeon: Alm DELENA Hummer, MD;  Location: [facility];  Service: Orthopedics;  Laterality: Right;    OB History     Gravida  2   Para  1   Term      Preterm      AB  1   Living         SAB      IAB      Ectopic      Multiple      Live Births               Home Medications    Prior to Admission medications   Medication Sig Start Date End Date Taking? Authorizing Provider  cephALEXin  (KEFLEX ) 500 MG capsule Take 1 capsule (500 mg total) by mouth 2 (two) times daily for 10 days. 07/31/24 08/10/24 Yes Ward, Harlene PEDLAR, PA-C  aspirin  EC 81 MG tablet Take 1 tablet (81 mg total) by mouth daily. Patient taking differently: Take 81 mg by mouth 3 (three) times a week. 03/14/20   Ladona Heinz, MD  aspirin -acetaminophen -caffeine (EXCEDRIN MIGRAINE) 250-250-65 MG tablet Take 2 tablets by mouth every  6 (six) hours as needed for headache.    [provider]  cetirizine (ZYRTEC) 10 MG tablet Take 10 mg by mouth daily as needed for allergies.  01/05/19   [provider]  dicyclomine  (BENTYL ) 20 MG tablet Take 1 tablet (20 mg total) by mouth 2 (two) times daily. Patient not taking: Reported on 07/31/2024 09/06/23   Henderly, Britni A, PA-C  diphenoxylate -atropine  (LOMOTIL ) 2.5-0.025 MG tablet Take 1 tablet by mouth 4 (four) times daily as needed for diarrhea or loose stools. 09/06/23   Henderly, Britni A, PA-C  furosemide  (LASIX ) 20 MG tablet Take 1 tablet (20 mg total) by mouth 2 (two) times daily as needed (Dyspnea). Patient not taking: Reported on 07/31/2024 07/16/22   Ganta, Anupa, DO  metoprolol  succinate (TOPROL  XL) 25 MG 24 hr tablet Take 1 tablet (25 mg total) by mouth in the morning. 07/16/22  10/14/22  Ganta, Anupa, DO  ondansetron  (ZOFRAN -ODT) 4 MG disintegrating tablet Take 1 tablet (4 mg total) by mouth every 8 (eight) hours as needed. Patient not taking: Reported on 07/31/2024 09/06/23   Henderly, Britni A, PA-C  rosuvastatin  (CRESTOR ) 20 MG tablet Take 1 tablet (20 mg total) by mouth daily. Patient not taking: Reported on 07/31/2024 07/16/22 07/11/23  Ganta, Anupa, DO  spironolactone  (ALDACTONE ) 25 MG tablet Take 1 tablet (25 mg total) by mouth daily. 07/16/22   Ganta, Anupa, DO    Family History Family History  Problem Relation Age of Onset   Cervical cancer Mother    Diabetes Mother    High blood pressure Mother    Diabetes Sister    High blood pressure Sister     Social History Social History   Tobacco Use   Smoking status: Light Smoker    Current packs/day: 1.00    Average packs/day: 1 pack/day for 24.0 years (24.0 ttl pk-yrs)    Types: Cigarettes   Smokeless tobacco: Never  Vaping Use   Vaping status: Never Used  Substance Use Topics   Alcohol use: Yes    Alcohol/week: 0.0 standard drinks of alcohol    Comment: occasionally   Drug use: No     Allergies   Hydrocodone  and Penicillins   Review of Systems Review of Systems  Constitutional:  Positive for fever. Negative for chills.  HENT:  Positive for congestion and sore throat. Negative for ear pain.   Eyes:  Negative for pain and visual disturbance.  Respiratory:  Positive for cough. Negative for shortness of breath.   Cardiovascular:  Negative for chest pain and palpitations.  Gastrointestinal:  Negative for abdominal pain and vomiting.  Genitourinary:  Negative for dysuria and hematuria.  Musculoskeletal:  Negative for arthralgias and back pain.  Skin:  Negative for color change and rash.  Neurological:  Negative for seizures and syncope.  All other systems reviewed and are negative.    Physical Exam Triage Vital Signs ED Triage Vitals [07/31/24 1147]  Encounter Vitals Group     BP 137/89      Girls Systolic BP Percentile      Girls Diastolic BP Percentile      Boys Systolic BP Percentile      Boys Diastolic BP Percentile      Pulse Rate 97     Resp 16     Temp 97.6 F (36.4 C)     Temp Source Oral     SpO2 96 %     Weight      Height      Head  Circumference      Peak Flow      Pain Score 10     Pain Loc      Pain Education      Exclude from Growth Chart    No data found.  Updated Vital Signs BP 137/89 (BP Location: Right Arm)   Pulse 97   Temp 97.6 F (36.4 C) (Oral)   Resp 16   SpO2 96%   Visual Acuity Right Eye Distance:   Left Eye Distance:   Bilateral Distance:    Right Eye Near:   Left Eye Near:    Bilateral Near:     Physical Exam Vitals and nursing note reviewed.  Constitutional:      General: She is not in acute distress.    Appearance: She is well-developed.  HENT:     Head: Normocephalic and atraumatic.     Mouth/Throat:     Pharynx: Uvula midline. Pharyngeal swelling, oropharyngeal exudate and posterior oropharyngeal erythema present.  Eyes:     Conjunctiva/sclera: Conjunctivae normal.  Cardiovascular:     Rate and Rhythm: Normal rate and regular rhythm.     Heart sounds: No murmur heard. Pulmonary:     Effort: Pulmonary effort is normal. No respiratory distress.     Breath sounds: Normal breath sounds.  Abdominal:     Palpations: Abdomen is soft.     Tenderness: There is no abdominal tenderness.  Musculoskeletal:        General: No swelling.     Cervical back: Neck supple.  Skin:    General: Skin is warm and dry.     Capillary Refill: Capillary refill takes less than 2 seconds.  Neurological:     Mental Status: She is alert.  Psychiatric:        Mood and Affect: Mood normal.      UC Treatments / Results  Labs (all labs ordered are listed, but only abnormal results are displayed) Labs Reviewed  POCT RAPID STREP A (OFFICE)  POC COVID19/FLU A&B COMBO    EKG   Radiology No results  found.  Procedures Procedures (including critical care time)  Medications Ordered in UC Medications  methylPREDNISolone sodium succinate (SOLU-MEDROL) 125 mg/2 mL injection 80 mg (80 mg Intramuscular Given 07/31/24 1233)    Initial Impression / Assessment and Plan / UC Course  I have reviewed the triage vital signs and the nursing notes.  Pertinent labs & imaging results that were available during my care of the patient were reviewed by me and considered in my medical decision making (see chart for details).     COVID/flu negative in clinic today.  Rapid strep is also negative but given symptoms we will treat for strep throat.  Antibiotic prescribed.  Steroid shot given in clinic today due to swelling of tonsils.  Supportive care discussed.  Return precautions discussed. Final Clinical Impressions(s) / UC Diagnoses   Final diagnoses:  Streptococcal sore throat  Acute pharyngitis, unspecified etiology     Discharge Instructions      Take antibiotics as prescribed. Recommend continue with ibuprofen  and Tylenol  as needed. Continue with warm salt water gargles. If no improvement or symptoms become worse please return for evaluation.    ED Prescriptions     Medication Sig Dispense Auth. Provider   cephALEXin  (KEFLEX ) 500 MG capsule Take 1 capsule (500 mg total) by mouth 2 (two) times daily for 10 days. 20 capsule Ward, Tuwana Kapaun Z, PA-C      PDMP not reviewed this encounter.  Ward, Harlene PEDLAR, PA-C 07/31/24 1254

## 2024-07-31 NOTE — ED Triage Notes (Signed)
 Patient reports that she has had a sore throat, a productive cough with yellow/green sputum, and a fever x 2 days.  Patient has taken Alka Seltzer Cold and Flu, Robitussin, Chloraseptic spray, Tylenol , salt H20 gargles, and Viscous Lidocaine  from a relative with no relief.

## 2024-08-08 ENCOUNTER — Emergency Department (HOSPITAL_BASED_OUTPATIENT_CLINIC_OR_DEPARTMENT_OTHER): Payer: Self-pay | Admitting: Radiology

## 2024-08-08 ENCOUNTER — Encounter (HOSPITAL_BASED_OUTPATIENT_CLINIC_OR_DEPARTMENT_OTHER): Payer: Self-pay | Admitting: Emergency Medicine

## 2024-08-08 ENCOUNTER — Emergency Department (HOSPITAL_BASED_OUTPATIENT_CLINIC_OR_DEPARTMENT_OTHER)
Admission: EM | Admit: 2024-08-08 | Discharge: 2024-08-08 | Disposition: A | Discharge status: Home / Self Care | Payer: Self-pay | Attending: Emergency Medicine | Admitting: Emergency Medicine

## 2024-08-08 ENCOUNTER — Other Ambulatory Visit: Payer: Self-pay

## 2024-08-08 DIAGNOSIS — R5383 Other fatigue: Secondary | ICD-10-CM | POA: Insufficient documentation

## 2024-08-08 DIAGNOSIS — R059 Cough, unspecified: Secondary | ICD-10-CM | POA: Insufficient documentation

## 2024-08-08 DIAGNOSIS — R197 Diarrhea, unspecified: Secondary | ICD-10-CM | POA: Insufficient documentation

## 2024-08-08 DIAGNOSIS — R109 Unspecified abdominal pain: Secondary | ICD-10-CM | POA: Insufficient documentation

## 2024-08-08 DIAGNOSIS — R112 Nausea with vomiting, unspecified: Secondary | ICD-10-CM | POA: Insufficient documentation

## 2024-08-08 DIAGNOSIS — R0602 Shortness of breath: Secondary | ICD-10-CM | POA: Insufficient documentation

## 2024-08-08 DIAGNOSIS — I10 Essential (primary) hypertension: Secondary | ICD-10-CM | POA: Insufficient documentation

## 2024-08-08 DIAGNOSIS — J029 Acute pharyngitis, unspecified: Secondary | ICD-10-CM | POA: Insufficient documentation

## 2024-08-08 DIAGNOSIS — Z79899 Other long term (current) drug therapy: Secondary | ICD-10-CM | POA: Insufficient documentation

## 2024-08-08 DIAGNOSIS — M549 Dorsalgia, unspecified: Secondary | ICD-10-CM | POA: Insufficient documentation

## 2024-08-08 DIAGNOSIS — Z7982 Long term (current) use of aspirin: Secondary | ICD-10-CM | POA: Insufficient documentation

## 2024-08-08 DIAGNOSIS — R0981 Nasal congestion: Secondary | ICD-10-CM | POA: Insufficient documentation

## 2024-08-08 LAB — LIPASE, BLOOD: Lipase: 28 U/L (ref 11–51)

## 2024-08-08 LAB — URINALYSIS, ROUTINE W REFLEX MICROSCOPIC
Bilirubin Urine: NEGATIVE
Glucose, UA: NEGATIVE mg/dL
Hgb urine dipstick: NEGATIVE
Ketones, ur: NEGATIVE mg/dL
Leukocytes,Ua: NEGATIVE
Nitrite: NEGATIVE
Protein, ur: NEGATIVE mg/dL
Specific Gravity, Urine: 1.007 (ref 1.005–1.030)
pH: 6.5 (ref 5.0–8.0)

## 2024-08-08 LAB — CBC WITH DIFFERENTIAL/PLATELET
Abs Immature Granulocytes: 0.05 K/uL (ref 0.00–0.07)
Basophils Absolute: 0 K/uL (ref 0.0–0.1)
Basophils Relative: 0 %
Eosinophils Absolute: 0.2 K/uL (ref 0.0–0.5)
Eosinophils Relative: 2 %
HCT: 45.5 % (ref 36.0–46.0)
Hemoglobin: 15.9 g/dL — ABNORMAL HIGH (ref 12.0–15.0)
Immature Granulocytes: 0 %
Lymphocytes Relative: 22 %
Lymphs Abs: 2.7 K/uL (ref 0.7–4.0)
MCH: 29.6 pg (ref 26.0–34.0)
MCHC: 34.9 g/dL (ref 30.0–36.0)
MCV: 84.7 fL (ref 80.0–100.0)
Monocytes Absolute: 0.7 K/uL (ref 0.1–1.0)
Monocytes Relative: 6 %
Neutro Abs: 8.4 K/uL — ABNORMAL HIGH (ref 1.7–7.7)
Neutrophils Relative %: 70 %
Platelets: 371 K/uL (ref 150–400)
RBC: 5.37 MIL/uL — ABNORMAL HIGH (ref 3.87–5.11)
RDW: 13 % (ref 11.5–15.5)
WBC: 12.1 K/uL — ABNORMAL HIGH (ref 4.0–10.5)
nRBC: 0 % (ref 0.0–0.2)

## 2024-08-08 LAB — COMPREHENSIVE METABOLIC PANEL WITH GFR
ALT: 16 U/L (ref 0–44)
AST: 26 U/L (ref 15–41)
Albumin: 3.9 g/dL (ref 3.5–5.0)
Alkaline Phosphatase: 116 U/L (ref 38–126)
Anion gap: 13 (ref 5–15)
BUN: 8 mg/dL (ref 6–20)
CO2: 24 mmol/L (ref 22–32)
Calcium: 9.7 mg/dL (ref 8.9–10.3)
Chloride: 102 mmol/L (ref 98–111)
Creatinine, Ser: 0.79 mg/dL (ref 0.44–1.00)
GFR, Estimated: >60 mL/min (ref >60)
Glucose, Bld: 95 mg/dL (ref 70–99)
Potassium: 4.1 mmol/L (ref 3.5–5.1)
Sodium: 138 mmol/L (ref 135–145)
Total Bilirubin: 0.5 mg/dL (ref 0.0–1.2)
Total Protein: 7.3 g/dL (ref 6.5–8.1)

## 2024-08-08 LAB — MONONUCLEOSIS SCREEN: Mono Screen: NEGATIVE

## 2024-08-08 LAB — RESP PANEL BY RT-PCR (RSV, FLU A&B, COVID)  RVPGX2
Influenza A by PCR: NEGATIVE
Influenza B by PCR: NEGATIVE
Resp Syncytial Virus by PCR: NEGATIVE
SARS Coronavirus 2 by RT PCR: NEGATIVE

## 2024-08-08 MED ORDER — DEXAMETHASONE SOD PHOSPHATE PF 10 MG/ML IJ SOLN
10.0000 mg | Freq: Once | INTRAMUSCULAR | Status: AC
  Administered 2024-08-08: 10 mg via INTRAVENOUS

## 2024-08-08 MED ORDER — KETOROLAC TROMETHAMINE 15 MG/ML IJ SOLN
15.0000 mg | Freq: Once | INTRAMUSCULAR | Status: AC
  Administered 2024-08-08: 15 mg via INTRAVENOUS
  Filled 2024-08-08: qty 1

## 2024-08-08 MED ORDER — ACETAMINOPHEN 500 MG PO TABS
1000.0000 mg | ORAL_TABLET | Freq: Once | ORAL | Status: AC
  Administered 2024-08-08: 1000 mg via ORAL
  Filled 2024-08-08: qty 2

## 2024-08-08 MED ORDER — LIDOCAINE VISCOUS HCL 2 % MT SOLN: 15.0000 mL | OROMUCOSAL | 0 refills | Status: AC | PRN

## 2024-08-08 MED ORDER — SODIUM CHLORIDE 0.9 % IV BOLUS
500.0000 mL | Freq: Once | INTRAVENOUS | Status: AC
  Administered 2024-08-08: 500 mL via INTRAVENOUS

## 2024-08-08 MED ORDER — LIDOCAINE VISCOUS HCL 2 % MT SOLN
15.0000 mL | Freq: Once | OROMUCOSAL | Status: AC
  Administered 2024-08-08: 15 mL via OROMUCOSAL
  Filled 2024-08-08: qty 15

## 2024-08-08 MED ORDER — PREDNISONE 10 MG PO TABS: 20.0000 mg | ORAL_TABLET | Freq: Every day | ORAL | 0 refills | Status: AC

## 2024-08-08 NOTE — Discharge Instructions (Addendum)
 Your exam and lab work are reassuring today.  I have given you a steroid and lidocaine  solution to help with your sore throat.  I have also given you a referral for ENT please call them on Monday to establish an appointment.  If you experience worsening symptoms please return to the ED for further evaluation.  It is also recommended to get established with a primary care provider.

## 2024-08-08 NOTE — ED Triage Notes (Signed)
 Pt caox4 ambulatory reporting she has been sick for approx 2 wks with with sore throat, productive cough, fever, and is now having back pain. Pt further reports she was neg for COVID, flu, and strep at [facility] and treated with a steroid and abx 11/1.

## 2024-08-08 NOTE — ED Provider Notes (Signed)
 [place] EM[facility]MENT AT [facility] Provider Note   CSN: [id number] Arrival date & time: 08/08/24  9047     Patient presents with: Sore Throat   Mary Benson is a 53 y.o. female.  53 year old female presents ED with complaints of 2 weeks of sore throat with several associated symptoms.  Patient advises symptoms began as sore throat and she went to urgent care 8 days ago because home remedies were not working.  She was given antibiotic and steroid shot diagnosed with strep.  Patient finished course of antibiotic without relief of symptoms.  Patient advises since the urgent care visit she has had associated cough, back pain, nausea, vomiting, diarrhea.   Patient denies any abdominal tenderness.  She has been able to keep down food and fluids today.  She does endorse feeling fatigued since being sick for so long.     Prior to Admission medications   Medication Sig Start Date End Date Taking? Authorizing Provider  lidocaine  (XYLOCAINE ) 2 % solution Use as directed 15 mLs in the mouth or throat as needed for mouth pain. 08/08/24  Yes Myriam Fonda RAMAN, PA-C  predniSONE (DELTASONE) 10 MG tablet Take 2 tablets (20 mg total) by mouth daily. 08/08/24  Yes Myriam Fonda RAMAN DEVONNA  aspirin  EC 81 MG tablet Take 1 tablet (81 mg total) by mouth daily. Patient taking differently: Take 81 mg by mouth 3 (three) times a week. 03/14/20   Ladona Heinz, MD  aspirin -acetaminophen -caffeine (EXCEDRIN MIGRAINE) 250-250-65 MG tablet Take 2 tablets by mouth every 6 (six) hours as needed for headache.    [provider]  cephALEXin  (KEFLEX ) 500 MG capsule Take 1 capsule (500 mg total) by mouth 2 (two) times daily for 10 days. 07/31/24 08/10/24  Ward, Harlene PEDLAR, PA-C  cetirizine (ZYRTEC) 10 MG tablet Take 10 mg by mouth daily as needed for allergies.  01/05/19   [provider]  dicyclomine  (BENTYL ) 20 MG tablet Take 1 tablet (20 mg total) by mouth 2 (two) times daily. Patient not  taking: Reported on 07/31/2024 09/06/23   Henderly, Britni A, PA-C  diphenoxylate -atropine  (LOMOTIL ) 2.5-0.025 MG tablet Take 1 tablet by mouth 4 (four) times daily as needed for diarrhea or loose stools. 09/06/23   Henderly, Britni A, PA-C  furosemide  (LASIX ) 20 MG tablet Take 1 tablet (20 mg total) by mouth 2 (two) times daily as needed (Dyspnea). Patient not taking: Reported on 07/31/2024 07/16/22   Ganta, Anupa, DO  metoprolol  succinate (TOPROL  XL) 25 MG 24 hr tablet Take 1 tablet (25 mg total) by mouth in the morning. 07/16/22 10/14/22  Ganta, Anupa, DO  ondansetron  (ZOFRAN -ODT) 4 MG disintegrating tablet Take 1 tablet (4 mg total) by mouth every 8 (eight) hours as needed. Patient not taking: Reported on 07/31/2024 09/06/23   Henderly, Britni A, PA-C  rosuvastatin  (CRESTOR ) 20 MG tablet Take 1 tablet (20 mg total) by mouth daily. Patient not taking: Reported on 07/31/2024 07/16/22 07/11/23  Ganta, Anupa, DO  spironolactone  (ALDACTONE ) 25 MG tablet Take 1 tablet (25 mg total) by mouth daily. 07/16/22   Ganta, Anupa, DO    Allergies: Hydrocodone  and Penicillins    Review of Systems  Constitutional:  Positive for fatigue.  HENT:  Positive for congestion.   Respiratory:  Positive for cough and shortness of breath.   Gastrointestinal:  Positive for diarrhea, nausea and vomiting.  All other systems reviewed and are negative.   Updated Vital Signs BP (!) 148/89 (BP Location: Left Arm)  Pulse 73   Temp 97.9 F (36.6 C)   Resp 15   Ht 4' 11 (1.499 m)   Wt 74.8 kg   SpO2 97%   BMI 33.33 kg/m   Physical Exam Vitals and nursing note reviewed.  Constitutional:      Appearance: Normal appearance. She is well-developed.  HENT:     Head: Normocephalic and atraumatic.     Nose: Congestion present.     Mouth/Throat:     Pharynx: Pharyngeal swelling and posterior oropharyngeal erythema present. No oropharyngeal exudate.     Comments: Mild tonsillar swelling with no exudate.  Notable erythema  throughout.  Patient's airway is patent and she is able to handle secretions appropriately. Eyes:     Extraocular Movements: Extraocular movements intact.     Conjunctiva/sclera: Conjunctivae normal.     Pupils: Pupils are equal, round, and reactive to light.  Neck:     Comments: No neck pain, neck rigidity, or pain to palpation through cervical spine. Cardiovascular:     Rate and Rhythm: Normal rate.  Pulmonary:     Effort: Pulmonary effort is normal. No respiratory distress.     Breath sounds: Normal breath sounds.     Comments: Lungs are clear to auscultation all fields. Abdominal:     General: Bowel sounds are normal. There is no distension.     Palpations: Abdomen is soft.     Tenderness: There is no abdominal tenderness.     Comments: No abdominal tenderness to palpation throughout  Musculoskeletal:        General: Normal range of motion.     Cervical back: Normal range of motion.     Comments: No lower extremity edema.  Patient does endorse some bilateral lower back pain on certain movements.  No pain to palpation throughout bilateral flanks.  Pain is only reproducible through movement.  No pain to palpation through lumbar or thoracic spines.  Skin:    General: Skin is warm.     Capillary Refill: Capillary refill takes less than 2 seconds.  Neurological:     General: No focal deficit present.     Mental Status: She is alert.  Psychiatric:        Mood and Affect: Mood normal.        Behavior: Behavior normal.     (all labs ordered are listed, but only abnormal results are displayed) Labs Reviewed  CBC WITH DIFFERENTIAL/PLATELET - Abnormal; Notable for the following components:      Result Value   WBC 12.1 (*)    RBC 5.37 (*)    Hemoglobin 15.9 (*)    Neutro Abs 8.4 (*)    All other components within normal limits  URINALYSIS, ROUTINE W REFLEX MICROSCOPIC - Abnormal; Notable for the following components:   Color, Urine COLORLESS (*)    All other components within  normal limits  RESP PANEL BY RT-PCR (RSV, FLU A&B, COVID)  [id number]  COMPREHENSIVE METABOLIC PANEL WITH GFR  LIPASE, BLOOD  MONONUCLEOSIS SCREEN    EKG: EKG Interpretation Date/Time:  Sunday August 08 2024 10:56:33 EST Ventricular Rate:  78 PR Interval:  106 QRS Duration:  94 QT Interval:  419 QTC Calculation: 478 R Axis:   51  Text Interpretation: Sinus rhythm Short PR interval No significant change since last tracing Confirmed by Dean Clarity [phone]) on 08/08/2024 12:48:51 PM  Radiology: [facility] Chest 2 View Result Date: 08/08/2024 EXAM: 2 VIEW(S) XRAY OF THE CHEST 08/08/2024 11:10:00 AM COMPARISON: 07/15/22 CLINICAL HISTORY: [facility]  FINDINGS: LUNGS AND PLEURA: No focal pulmonary opacity. No pulmonary edema. No pleural effusion. No pneumothorax. HEART AND MEDIASTINUM: Coronary stent noted. No acute abnormality of the cardiac and mediastinal silhouettes. BONES AND SOFT TISSUES: Cholecystectomy clips noted. No acute osseous abnormality. IMPRESSION: 1. No acute cardiopulmonary process identified. Electronically signed by: Waddell Calk MD 08/08/2024 11:32 AM EST RP Workstation: [id number]     Procedures   Medications Ordered in the ED  sodium chloride  0.9 % bolus 500 mL (0 mLs Intravenous Stopped 08/08/24 1141)  acetaminophen  (TYLENOL ) tablet 1,000 mg (1,000 mg Oral Given 08/08/24 1206)  ketorolac  (TORADOL ) 15 MG/ML injection 15 mg (15 mg Intravenous Given 08/08/24 1206)  dexamethasone  (DECADRON ) injection 10 mg (10 mg Intravenous Given 08/08/24 1207)  lidocaine  (XYLOCAINE ) 2 % viscous mouth solution 15 mL (15 mLs Mouth/Throat Given 08/08/24 1300)    53 y.o. female presents to the ED with complaints of sore throat, cough, shortness of breath, bilateral flank pain, nausea, vomiting, diarrhea, this involves an extensive number of treatment options, and is a complaint that carries with it a high risk of complications and morbidity.  The differential diagnosis includes pneumonia, viral illness,  colitis, appendicitis, sinusitis, (Ddx)  On arrival pt is nontoxic, vitals remarkable for being hypertensive. Exam significant for erythema throughout oropharynx, no exudates noted, productive cough, and reported bilateral flank pain on movement.  I ordered medication Toradol  Tylenol  Decadron  and viscous lidocaine  for headache and sore throat  Lab Tests:  I Ordered, reviewed, and interpreted labs, which included: CMP lipase CBC UA respiratory panel and mono.  Only significant finding was mild leukocytosis likely reactive from prolonged sore throat.   Imaging Studies ordered:  I ordered imaging studies which included chest x-ray, I independently visualized and interpreted imaging which showed no acute abnormalities noted.  ED Course:   Initial exam patient sitting comfortable in ED. Patient endorses fatigue due to length of illness.  Patient was given Keflex  at urgent care for acute pharyngitis.  Patient has not seen relief after full course of antibiotic or steroid.  Lab work and imaging were unremarkable.  On reassessment patient still endorses sore throat and reports that her headache is getting worse.  Patient will be given migraine cocktail and and lidocaine  then reevaluated.  Patient denies any nausea at this time.  Patient reports feeling much better after migraine cocktail and she reports lidocaine  significantly improved her sore throat.  Patient was given ENT referral and advised to establish care with PCP.  Patient was given a prescription for viscous lidocaine  and steroid and advised to monitor for any worsening symptoms.  Patient agreed to strict return precautions and comfortable discharge at this time.   Portions of this note were generated with Scientist, clinical (histocompatibility and immunogenetics). Dictation errors may occur despite best attempts at proofreading.   Final diagnoses:  Sore throat    ED Discharge Orders          Ordered    lidocaine  (XYLOCAINE ) 2 % solution  As needed        08/08/24  1417    predniSONE (DELTASONE) 10 MG tablet  Daily        08/08/24 1417               Myriam Fonda RAMAN, [place] 08/08/24 1645    Dean Clarity, MD 08/09/24 [phone]

## 2024-08-08 NOTE — ED Notes (Signed)
 Patient transported to X-ray
